# Patient Record
Sex: Female | Born: 1975 | Hispanic: No | Smoking: Never smoker
Health system: Southern US, Community
[De-identification: ages and names within clinical notes are randomized; demographics above are authoritative.]

## PROBLEM LIST (undated history)

## (undated) DIAGNOSIS — R51 Headache: Secondary | ICD-10-CM

## (undated) DIAGNOSIS — J309 Allergic rhinitis, unspecified: Secondary | ICD-10-CM

## (undated) DIAGNOSIS — M797 Fibromyalgia: Secondary | ICD-10-CM

## (undated) DIAGNOSIS — T7840XA Allergy, unspecified, initial encounter: Secondary | ICD-10-CM

## (undated) DIAGNOSIS — J45909 Unspecified asthma, uncomplicated: Secondary | ICD-10-CM

## (undated) DIAGNOSIS — F329 Major depressive disorder, single episode, unspecified: Secondary | ICD-10-CM

## (undated) DIAGNOSIS — E0500 Thyrotoxicosis with diffuse goiter without thyrotoxic crisis or storm: Secondary | ICD-10-CM

## (undated) DIAGNOSIS — F419 Anxiety disorder, unspecified: Secondary | ICD-10-CM

## (undated) DIAGNOSIS — F32A Depression, unspecified: Secondary | ICD-10-CM

## (undated) HISTORY — PX: HIP ARTHROSCOPY: SUR88

## (undated) HISTORY — DX: Headache: R51

## (undated) HISTORY — DX: Depression, unspecified: F32.A

## (undated) HISTORY — DX: Anxiety disorder, unspecified: F41.9

## (undated) HISTORY — DX: Allergic rhinitis, unspecified: J30.9

## (undated) HISTORY — DX: Major depressive disorder, single episode, unspecified: F32.9

## (undated) HISTORY — DX: Allergy, unspecified, initial encounter: T78.40XA

---

## 1998-02-06 ENCOUNTER — Emergency Department (HOSPITAL_COMMUNITY): Admission: EM | Admit: 1998-02-06 | Discharge: 1998-02-06 | Payer: Self-pay | Admitting: Emergency Medicine

## 1999-08-23 HISTORY — PX: APPENDECTOMY: SHX54

## 2000-10-26 ENCOUNTER — Emergency Department (HOSPITAL_COMMUNITY): Admission: EM | Admit: 2000-10-26 | Discharge: 2000-10-27 | Payer: Self-pay | Admitting: Emergency Medicine

## 2000-12-30 ENCOUNTER — Encounter: Payer: Self-pay | Admitting: Emergency Medicine

## 2000-12-30 ENCOUNTER — Emergency Department (HOSPITAL_COMMUNITY): Admission: EM | Admit: 2000-12-30 | Discharge: 2000-12-30 | Payer: Self-pay | Admitting: Emergency Medicine

## 2001-05-19 ENCOUNTER — Inpatient Hospital Stay (HOSPITAL_COMMUNITY): Admission: EM | Admit: 2001-05-19 | Discharge: 2001-05-20 | Payer: Self-pay | Admitting: Emergency Medicine

## 2002-03-07 ENCOUNTER — Emergency Department (HOSPITAL_COMMUNITY): Admission: EM | Admit: 2002-03-07 | Discharge: 2002-03-07 | Payer: Self-pay | Admitting: Emergency Medicine

## 2002-08-26 ENCOUNTER — Inpatient Hospital Stay (HOSPITAL_COMMUNITY): Admission: EM | Admit: 2002-08-26 | Discharge: 2002-08-30 | Payer: Self-pay | Admitting: Psychiatry

## 2002-09-04 ENCOUNTER — Other Ambulatory Visit (HOSPITAL_COMMUNITY): Admission: RE | Admit: 2002-09-04 | Discharge: 2002-09-19 | Payer: Self-pay | Admitting: *Deleted

## 2003-02-17 ENCOUNTER — Observation Stay (HOSPITAL_COMMUNITY): Admission: AD | Admit: 2003-02-17 | Discharge: 2003-02-17 | Payer: Self-pay | Admitting: *Deleted

## 2003-02-27 ENCOUNTER — Other Ambulatory Visit: Admission: RE | Admit: 2003-02-27 | Discharge: 2003-02-27 | Payer: Self-pay | Admitting: *Deleted

## 2003-05-08 ENCOUNTER — Inpatient Hospital Stay (HOSPITAL_COMMUNITY): Admission: AD | Admit: 2003-05-08 | Discharge: 2003-05-08 | Payer: Self-pay | Admitting: *Deleted

## 2003-07-15 ENCOUNTER — Inpatient Hospital Stay (HOSPITAL_COMMUNITY): Admission: AD | Admit: 2003-07-15 | Discharge: 2003-07-15 | Payer: Self-pay | Admitting: Obstetrics and Gynecology

## 2003-08-19 ENCOUNTER — Inpatient Hospital Stay (HOSPITAL_COMMUNITY): Admission: AD | Admit: 2003-08-19 | Discharge: 2003-08-21 | Payer: Self-pay | Admitting: *Deleted

## 2003-08-22 ENCOUNTER — Inpatient Hospital Stay (HOSPITAL_COMMUNITY): Admission: AD | Admit: 2003-08-22 | Discharge: 2003-08-22 | Payer: Self-pay | Admitting: Obstetrics and Gynecology

## 2003-09-01 ENCOUNTER — Inpatient Hospital Stay (HOSPITAL_COMMUNITY): Admission: AD | Admit: 2003-09-01 | Discharge: 2003-09-01 | Payer: Self-pay | Admitting: Obstetrics and Gynecology

## 2003-09-02 ENCOUNTER — Inpatient Hospital Stay (HOSPITAL_COMMUNITY): Admission: AD | Admit: 2003-09-02 | Discharge: 2003-09-02 | Payer: Self-pay | Admitting: Obstetrics and Gynecology

## 2003-09-15 ENCOUNTER — Inpatient Hospital Stay (HOSPITAL_COMMUNITY): Admission: AD | Admit: 2003-09-15 | Discharge: 2003-09-15 | Payer: Self-pay | Admitting: Obstetrics and Gynecology

## 2003-09-18 ENCOUNTER — Inpatient Hospital Stay (HOSPITAL_COMMUNITY): Admission: AD | Admit: 2003-09-18 | Discharge: 2003-09-19 | Payer: Self-pay | Admitting: Obstetrics and Gynecology

## 2003-09-20 ENCOUNTER — Inpatient Hospital Stay (HOSPITAL_COMMUNITY): Admission: AD | Admit: 2003-09-20 | Discharge: 2003-09-25 | Payer: Self-pay | Admitting: Obstetrics & Gynecology

## 2003-11-03 ENCOUNTER — Other Ambulatory Visit: Admission: RE | Admit: 2003-11-03 | Discharge: 2003-11-03 | Payer: Self-pay | Admitting: Obstetrics and Gynecology

## 2003-12-03 ENCOUNTER — Emergency Department (HOSPITAL_COMMUNITY): Admission: EM | Admit: 2003-12-03 | Discharge: 2003-12-03 | Payer: Self-pay | Admitting: Emergency Medicine

## 2004-06-14 ENCOUNTER — Emergency Department (HOSPITAL_COMMUNITY): Admission: EM | Admit: 2004-06-14 | Discharge: 2004-06-14 | Payer: Self-pay | Admitting: Emergency Medicine

## 2004-06-30 ENCOUNTER — Emergency Department (HOSPITAL_COMMUNITY): Admission: EM | Admit: 2004-06-30 | Discharge: 2004-06-30 | Payer: Self-pay | Admitting: Family Medicine

## 2004-07-07 ENCOUNTER — Observation Stay (HOSPITAL_COMMUNITY): Admission: RE | Admit: 2004-07-07 | Discharge: 2004-07-08 | Payer: Self-pay | Admitting: General Surgery

## 2004-08-22 HISTORY — PX: CHOLECYSTECTOMY: SHX55

## 2004-12-29 ENCOUNTER — Other Ambulatory Visit: Admission: RE | Admit: 2004-12-29 | Discharge: 2004-12-29 | Payer: Self-pay | Admitting: Obstetrics and Gynecology

## 2005-11-09 ENCOUNTER — Emergency Department (HOSPITAL_COMMUNITY): Admission: EM | Admit: 2005-11-09 | Discharge: 2005-11-09 | Payer: Self-pay | Admitting: Family Medicine

## 2006-02-07 ENCOUNTER — Emergency Department (HOSPITAL_COMMUNITY): Admission: EM | Admit: 2006-02-07 | Discharge: 2006-02-07 | Payer: Self-pay | Admitting: Family Medicine

## 2006-05-17 ENCOUNTER — Emergency Department (HOSPITAL_COMMUNITY): Admission: EM | Admit: 2006-05-17 | Discharge: 2006-05-17 | Payer: Self-pay | Admitting: Family Medicine

## 2006-06-10 ENCOUNTER — Emergency Department (HOSPITAL_COMMUNITY): Admission: EM | Admit: 2006-06-10 | Discharge: 2006-06-10 | Payer: Self-pay | Admitting: Emergency Medicine

## 2006-10-14 ENCOUNTER — Emergency Department (HOSPITAL_COMMUNITY): Admission: EM | Admit: 2006-10-14 | Discharge: 2006-10-14 | Payer: Self-pay | Admitting: Emergency Medicine

## 2011-02-04 ENCOUNTER — Inpatient Hospital Stay (HOSPITAL_COMMUNITY)
Admission: AD | Admit: 2011-02-04 | Discharge: 2011-02-04 | Disposition: A | Discharge status: Home / Self Care | Payer: Managed Care, Other (non HMO) | Source: Ambulatory Visit | Attending: Obstetrics and Gynecology | Admitting: Obstetrics and Gynecology

## 2011-02-04 DIAGNOSIS — O4700 False labor before 37 completed weeks of gestation, unspecified trimester: Secondary | ICD-10-CM | POA: Insufficient documentation

## 2011-02-04 LAB — URINALYSIS, ROUTINE W REFLEX MICROSCOPIC
Ketones, ur: NEGATIVE mg/dL
Leukocytes, UA: NEGATIVE
Nitrite: NEGATIVE
Protein, ur: NEGATIVE mg/dL
Specific Gravity, Urine: 1.025 (ref 1.005–1.030)
Urobilinogen, UA: 0.2 mg/dL (ref 0.0–1.0)
pH: 7 (ref 5.0–8.0)

## 2011-03-15 ENCOUNTER — Inpatient Hospital Stay (HOSPITAL_COMMUNITY)
Admission: AD | Admit: 2011-03-15 | Discharge: 2011-03-17 | DRG: 778 | Disposition: A | Discharge status: Home / Self Care | Payer: Managed Care, Other (non HMO) | Source: Ambulatory Visit | Attending: Obstetrics and Gynecology | Admitting: Obstetrics and Gynecology

## 2011-03-15 ENCOUNTER — Encounter (HOSPITAL_COMMUNITY): Payer: Self-pay | Admitting: *Deleted

## 2011-03-15 DIAGNOSIS — E079 Disorder of thyroid, unspecified: Secondary | ICD-10-CM | POA: Diagnosis present

## 2011-03-15 DIAGNOSIS — O99280 Endocrine, nutritional and metabolic diseases complicating pregnancy, unspecified trimester: Secondary | ICD-10-CM | POA: Diagnosis present

## 2011-03-15 DIAGNOSIS — E039 Hypothyroidism, unspecified: Secondary | ICD-10-CM | POA: Diagnosis present

## 2011-03-15 DIAGNOSIS — O09219 Supervision of pregnancy with history of pre-term labor, unspecified trimester: Secondary | ICD-10-CM

## 2011-03-15 DIAGNOSIS — O4700 False labor before 37 completed weeks of gestation, unspecified trimester: Principal | ICD-10-CM | POA: Diagnosis present

## 2011-03-15 HISTORY — DX: Thyrotoxicosis with diffuse goiter without thyrotoxic crisis or storm: E05.00

## 2011-03-15 LAB — URINALYSIS, ROUTINE W REFLEX MICROSCOPIC
Bilirubin Urine: NEGATIVE
Hgb urine dipstick: NEGATIVE
Ketones, ur: 15 mg/dL — AB
Leukocytes, UA: NEGATIVE
Nitrite: NEGATIVE
Protein, ur: NEGATIVE mg/dL
Specific Gravity, Urine: >1.03 — ABNORMAL HIGH (ref 1.005–1.030)
Urobilinogen, UA: 1 mg/dL (ref 0.0–1.0)
pH: 6 (ref 5.0–8.0)

## 2011-03-15 LAB — WET PREP, GENITAL
Clue Cells Wet Prep HPF POC: NONE SEEN
Trich, Wet Prep: NONE SEEN
WBC, Wet Prep HPF POC: NONE SEEN
Yeast Wet Prep HPF POC: NONE SEEN

## 2011-03-15 LAB — FETAL FIBRONECTIN: Fetal Fibronectin: POSITIVE — AB

## 2011-03-15 MED ORDER — ACETAMINOPHEN 325 MG PO TABS
650.0000 mg | ORAL_TABLET | ORAL | Status: DC | PRN
  Administered 2011-03-16 (×2): 650 mg via ORAL
  Filled 2011-03-15 (×2): qty 2

## 2011-03-15 MED ORDER — BETAMETHASONE SOD PHOS & ACET 6 (3-3) MG/ML IJ SUSP
12.0000 mg | INTRAMUSCULAR | Status: AC
  Administered 2011-03-15 – 2011-03-16 (×2): 12 mg via INTRAMUSCULAR
  Filled 2011-03-15 (×2): qty 2

## 2011-03-15 MED ORDER — LEVOTHYROXINE SODIUM 150 MCG PO TABS
150.0000 ug | ORAL_TABLET | Freq: Every day | ORAL | Status: DC
  Administered 2011-03-16 – 2011-03-17 (×2): 150 ug via ORAL
  Filled 2011-03-15 (×2): qty 1

## 2011-03-15 MED ORDER — ZOLPIDEM TARTRATE 10 MG PO TABS
10.0000 mg | ORAL_TABLET | Freq: Every evening | ORAL | Status: DC | PRN
  Administered 2011-03-16: 10 mg via ORAL
  Filled 2011-03-15: qty 1

## 2011-03-15 MED ORDER — LACTATED RINGERS IV SOLN
INTRAVENOUS | Status: DC
  Administered 2011-03-16 (×2): via INTRAVENOUS

## 2011-03-15 MED ORDER — TERBUTALINE SULFATE 1 MG/ML IJ SOLN
0.2500 mg | Freq: Once | INTRAMUSCULAR | Status: AC
  Administered 2011-03-15: 0.25 mg via SUBCUTANEOUS
  Filled 2011-03-15: qty 1

## 2011-03-15 MED ORDER — DOCUSATE SODIUM 100 MG PO CAPS
100.0000 mg | ORAL_CAPSULE | Freq: Every day | ORAL | Status: DC
  Administered 2011-03-16 – 2011-03-17 (×2): 100 mg via ORAL
  Filled 2011-03-15 (×2): qty 1

## 2011-03-15 MED ORDER — MAGNESIUM SULFATE 40 G IN LACTATED RINGERS - SIMPLE
2.0000 g/h | INTRAVENOUS | Status: DC
  Administered 2011-03-16: 4 g/h via INTRAVENOUS
  Filled 2011-03-15: qty 500

## 2011-03-15 MED ORDER — CALCIUM CARBONATE ANTACID 500 MG PO CHEW: 2.0000 | CHEWABLE_TABLET | ORAL | Status: DC | PRN

## 2011-03-15 MED ORDER — PRENATAL PLUS 27-1 MG PO TABS
1.0000 | ORAL_TABLET | Freq: Every day | ORAL | Status: DC
  Administered 2011-03-16 – 2011-03-17 (×2): 1 via ORAL
  Filled 2011-03-15 (×2): qty 1

## 2011-03-15 MED ORDER — SODIUM CHLORIDE 0.9 % IV SOLN
3.0000 g | Freq: Four times a day (QID) | INTRAVENOUS | Status: DC
  Administered 2011-03-16 (×2): 3 g via INTRAVENOUS
  Filled 2011-03-15 (×3): qty 3

## 2011-03-15 MED ORDER — MAGNESIUM SULFATE BOLUS VIA INFUSION
4.0000 g | Freq: Once | INTRAVENOUS | Status: AC
  Administered 2011-03-15: 4 g via INTRAVENOUS
  Filled 2011-03-15: qty 500

## 2011-03-15 MED ORDER — HYDROXYPROGESTERONE CAPROATE 250 MG/ML IM OIL
250.0000 mg | TOPICAL_OIL | INTRAMUSCULAR | Status: DC
  Administered 2011-03-16: 250 mg via INTRAMUSCULAR
  Filled 2011-03-15: qty 1

## 2011-03-15 NOTE — H&P (Signed)
SASHIA CAMPAS is a 35 y.o. female presenting for evaluation of PTL.  Pregnancy complcated by hx of PTD at 36 weeks has been on BR and weekly progesterone this pregnancy.  Have shortened cx by [place] 2 weeks ago. In triage, had ctxs q 3-5 minutes and cx 1/30/high which responded to terb but FFN returned positive. History OB History    Grav Para Term Preterm Abortions TAB SAB Ect Mult Living   2 1 0 1 0 0 0 0 0 1      Past Medical History  Diagnosis Date  . Grave's disease    Past Surgical History  Procedure Date  . Appendectomy 2001  . Cholecystectomy 2006   Family History: family history is not on file. Social History:  reports that she has never smoked. She does not have any smokeless tobacco history on file. She reports that she does not drink alcohol or use illicit drugs.  ROS  Dilation: 1 Effacement (%): 30 Station: -3 Exam by:: Ivonne Andrew, CNM  Blood pressure 119/63, pulse 95, temperature 98 F (36.7 C), temperature source Oral, resp. rate 20, height 6\' 3"  (1.905 m), weight 75.751 kg (167 lb), last menstrual period 06/22/2010. Exam Physical Exam  Prenatal labs: ABO, Rh:   Antibody:   Rubella:   RPR:    HBsAg:    HIV:    GBS:     Assessment/Plan: IUP at 33 weeks with PTL and positive FFN Admit for MgSo4 tocolysis and CP prophylaxis Check GBS Begin IV Unasyn until GBS returns BMZ DL   Enrika Aguado C 11/28/8117, 11:23 PM

## 2011-03-15 NOTE — ED Provider Notes (Signed)
fFN + UC's decreased after terb.  Dr. Rana Snare notified  Admit to Antepartum for Magnesium Sulfate, BMZ and ABX. Pt notified. Dr. Rana Snare to assume care of pt.

## 2011-03-15 NOTE — ED Provider Notes (Signed)
History   Chief Complaint:  Contractions   Lisa Day is  35 y.o. G2P0101. Patient's last menstrual period was 06/22/2010. She is [redacted]w[redacted]d weeks gestation. Since her last visit, the patient reports an increase in contractions and LBP. The patient denies bleeding or LOF and reports +FM. She is on 17-P for PTL and Hx 36.5 week delivery. On 02/03/11 her CL was 2.1 cm, closed, fFN neg. General ROS:  negative  Physical Exam   Blood pressure 118/79, pulse 85, temperature 98.2 F (36.8 C), resp. rate 20, height 6\' 3"  (1.905 m), weight 75.751 kg (167 lb), last menstrual period 06/22/2010.  Focused Gynecological Exam: normal external genitalia, vulva, vagina, uterus and adnexa VE: Dilation: 1 Effacement (%): 30 Cervical Position: Posterior Station: -3 Exam by:: Ivonne Andrew, CNM   EFM: 140's, Category I UC's: Q2-6, mild  Labs: Recent Results (from the past 24 hour(s))  URINALYSIS, ROUTINE W REFLEX MICROSCOPIC   Collection Time   03/15/11  7:55 PM      Component Value Range   Color, Urine YELLOW  YELLOW    Appearance CLEAR  CLEAR    Specific Gravity, Urine >1.030 (*) 1.005 - 1.030    pH 6.0  5.0 - 8.0    Glucose, UA NEGATIVE  NEGATIVE (mg/dL)   Hgb urine dipstick NEGATIVE  NEGATIVE    Bilirubin Urine NEGATIVE  NEGATIVE    Ketones, ur 15 (*) NEGATIVE (mg/dL)   Protein, ur NEGATIVE  NEGATIVE (mg/dL)   Urobilinogen, UA 1.0  0.0 - 1.0 (mg/dL)   Nitrite NEGATIVE  NEGATIVE    Leukocytes, UA NEGATIVE  NEGATIVE     Ultrasound Studies:   No results found.  Assessment: 1. [redacted]w[redacted]d weeks gestation 2. Preterm UC's w/ cervical change on 17-P 3. FHR category I 4. Dehydration, tol PO's   Plan: 1. Per consult w/ Dr. Rana Snare: fFN, Wet Prep. GBS, GC/CT 2. Terbutaline 0.25 mg SQ x 1 3. Push PO fluids 4. Will call MD w/ fFN results  [name] 03/15/2011, 9:17 PM

## 2011-03-15 NOTE — Progress Notes (Signed)
Pt in c/o pelvic pressure since 1730.  Is currently on progesterone shots weekly for preterm labor and shortened cervix.  Called the office and was told to come here.  Reports lower back pain, but denies any contractions.  Denies any bleeding or leaking of fluid.  + FM.

## 2011-03-16 ENCOUNTER — Inpatient Hospital Stay (HOSPITAL_COMMUNITY): Payer: Managed Care, Other (non HMO)

## 2011-03-16 MED ORDER — SODIUM CHLORIDE 0.9 % IJ SOLN
3.0000 mL | Freq: Two times a day (BID) | INTRAMUSCULAR | Status: DC
  Administered 2011-03-16 (×2): 3 mL via INTRAVENOUS

## 2011-03-16 MED ORDER — NIFEDIPINE 10 MG PO CAPS
10.0000 mg | ORAL_CAPSULE | Freq: Four times a day (QID) | ORAL | Status: DC
  Administered 2011-03-16 – 2011-03-17 (×5): 10 mg via ORAL
  Filled 2011-03-16 (×5): qty 1

## 2011-03-16 NOTE — Progress Notes (Signed)
UR chart review completed.  

## 2011-03-16 NOTE — Progress Notes (Signed)
  S:  No ctx's noted good fetal movement O:  Afebrile vss        Gravid uterus nontender cervix long and closed         fhr 140's reacitive no decels irritability noted A:  IUP at 33 3/7 with preterm ctx's      Hypothyroidism P: stop magnesium.  Begin procardia.  Check sono.  Check thyroid panel.  Finish bmz.  Stop unasyn.

## 2011-03-16 NOTE — Plan of Care (Signed)
Problem: Consults Goal: Birthing Suites Patient Information Press F2 to bring up selections list  Outcome: Progressing  Pt < [redacted] weeks EGA     

## 2011-03-17 DIAGNOSIS — O09219 Supervision of pregnancy with history of pre-term labor, unspecified trimester: Secondary | ICD-10-CM

## 2011-03-17 LAB — GC/CHLAMYDIA PROBE AMP, GENITAL: GC Probe Amp, Genital: NEGATIVE

## 2011-03-17 MED ORDER — NIFEDIPINE 10 MG PO CAPS: 10.0000 mg | ORAL_CAPSULE | Freq: Four times a day (QID) | ORAL | Status: DC

## 2011-03-17 NOTE — Discharge Summary (Signed)
Admission Diagnosis: PTL +FFN  Discharge Diagnosis: Same  HPI: 35 year old female at 49 1/2 weeks admitted with PTL and +FFN She was given tocolytics and steroids on admission. Patient 's contractions were easily treated. She was discharged home today with po Procardia 10 mg every 6 hours prn contractions She will followup in the office next week. PTL precautions discussed /reviewed with the patient

## 2011-03-17 NOTE — Progress Notes (Signed)
  S: Patient feels much better Denies UCs. Denies ROM. Good FM.  O:  AF VSS Abd soft nt Uterus is nt FHR is reassurring  TOCO no ucs  A: IUP at 35 + PTL +FFN P: Discharge home Po procardia 10 mg every 6 hours Discharge instructions followup next wed - already scheduled

## 2011-03-18 LAB — CULTURE, BETA STREP (GROUP B ONLY)

## 2011-03-20 NOTE — ED Provider Notes (Signed)
Positive Chlamydia.  Will fax report to provider's office for them to call patient and initiate treatment

## 2011-04-14 ENCOUNTER — Encounter (HOSPITAL_COMMUNITY): Payer: Self-pay | Admitting: *Deleted

## 2011-04-14 ENCOUNTER — Inpatient Hospital Stay (HOSPITAL_COMMUNITY)
Admission: AD | Admit: 2011-04-14 | Discharge: 2011-04-14 | Disposition: A | Discharge status: Home / Self Care | Payer: Managed Care, Other (non HMO) | Source: Ambulatory Visit | Attending: Obstetrics and Gynecology | Admitting: Obstetrics and Gynecology

## 2011-04-14 DIAGNOSIS — O479 False labor, unspecified: Secondary | ICD-10-CM | POA: Insufficient documentation

## 2011-04-14 LAB — STREP B DNA PROBE: GBS: POSITIVE

## 2011-04-14 NOTE — Progress Notes (Signed)
Pt in for labor eval, reports ucs q5 minutes.  Reports small amount of vaginal bleeding.  Denies any leaking of fluid.  + FM.

## 2011-04-14 NOTE — Progress Notes (Signed)
Pt states she is having contractions every 5 minutes with bloody show. Was in the office this am and was 3 cm. No leaking and reports good fetal movement.

## 2011-04-20 ENCOUNTER — Encounter (HOSPITAL_COMMUNITY): Payer: Self-pay | Admitting: *Deleted

## 2011-04-20 ENCOUNTER — Encounter (HOSPITAL_COMMUNITY): Payer: Self-pay | Admitting: Anesthesiology

## 2011-04-20 ENCOUNTER — Other Ambulatory Visit: Payer: Self-pay | Admitting: Obstetrics and Gynecology

## 2011-04-20 ENCOUNTER — Inpatient Hospital Stay (HOSPITAL_COMMUNITY)
Admission: AD | Admit: 2011-04-20 | Discharge: 2011-04-23 | DRG: 766 | Disposition: A | Discharge status: Home / Self Care | Payer: Managed Care, Other (non HMO) | Source: Ambulatory Visit | Attending: Obstetrics and Gynecology | Admitting: Obstetrics and Gynecology

## 2011-04-20 ENCOUNTER — Inpatient Hospital Stay (HOSPITAL_COMMUNITY): Payer: Managed Care, Other (non HMO) | Admitting: Anesthesiology

## 2011-04-20 ENCOUNTER — Encounter (HOSPITAL_COMMUNITY)
Admission: AD | Disposition: A | Discharge status: Home / Self Care | Payer: Self-pay | Source: Ambulatory Visit | Attending: Obstetrics and Gynecology

## 2011-04-20 DIAGNOSIS — O99892 Other specified diseases and conditions complicating childbirth: Secondary | ICD-10-CM | POA: Diagnosis present

## 2011-04-20 DIAGNOSIS — Z22330 Carrier of Group B streptococcus: Secondary | ICD-10-CM

## 2011-04-20 DIAGNOSIS — Z98891 History of uterine scar from previous surgery: Secondary | ICD-10-CM

## 2011-04-20 DIAGNOSIS — D649 Anemia, unspecified: Secondary | ICD-10-CM

## 2011-04-20 LAB — CBC
HCT: 29.8 % — ABNORMAL LOW (ref 36.0–46.0)
MCH: 27.5 pg (ref 26.0–34.0)
MCHC: 34.6 g/dL (ref 30.0–36.0)
MCV: 79.7 fL (ref 78.0–100.0)
RDW: 14.5 % (ref 11.5–15.5)

## 2011-04-20 SURGERY — Surgical Case
Anesthesia: Spinal | Site: Abdomen | Wound class: Clean Contaminated

## 2011-04-20 MED ORDER — MEPERIDINE HCL 25 MG/ML IJ SOLN: 6.2500 mg | INTRAMUSCULAR | Status: DC | PRN

## 2011-04-20 MED ORDER — NALBUPHINE HCL 10 MG/ML IJ SOLN
5.0000 mg | INTRAMUSCULAR | Status: DC | PRN
  Filled 2011-04-20: qty 1

## 2011-04-20 MED ORDER — SCOPOLAMINE 1 MG/3DAYS TD PT72
MEDICATED_PATCH | TRANSDERMAL | Status: AC
  Administered 2011-04-20: 1.5 mg via TRANSDERMAL
  Filled 2011-04-20: qty 1

## 2011-04-20 MED ORDER — KETOROLAC TROMETHAMINE 60 MG/2ML IM SOLN
INTRAMUSCULAR | Status: AC
  Administered 2011-04-20: 60 mg via INTRAMUSCULAR
  Filled 2011-04-20: qty 2

## 2011-04-20 MED ORDER — DIPHENHYDRAMINE HCL 25 MG PO CAPS: 25.0000 mg | ORAL_CAPSULE | Freq: Four times a day (QID) | ORAL | Status: DC | PRN

## 2011-04-20 MED ORDER — DIPHENHYDRAMINE HCL 50 MG/ML IJ SOLN
12.5000 mg | INTRAMUSCULAR | Status: DC | PRN
  Administered 2011-04-20: 12.5 mg via INTRAVENOUS
  Filled 2011-04-20: qty 1

## 2011-04-20 MED ORDER — FAMOTIDINE IN NACL 20-0.9 MG/50ML-% IV SOLN
20.0000 mg | Freq: Once | INTRAVENOUS | Status: AC
  Administered 2011-04-20: 20 mg via INTRAVENOUS

## 2011-04-20 MED ORDER — HYDROMORPHONE HCL 1 MG/ML IJ SOLN
INTRAMUSCULAR | Status: AC
Start: 2011-04-20 — End: 2011-04-20
  Filled 2011-04-20: qty 1

## 2011-04-20 MED ORDER — DIBUCAINE 1 % RE OINT: 1.0000 "application " | TOPICAL_OINTMENT | RECTAL | Status: DC | PRN

## 2011-04-20 MED ORDER — MENTHOL 3 MG MT LOZG: 1.0000 | LOZENGE | OROMUCOSAL | Status: DC | PRN

## 2011-04-20 MED ORDER — DIPHENHYDRAMINE HCL 50 MG/ML IJ SOLN: 25.0000 mg | INTRAMUSCULAR | Status: DC | PRN

## 2011-04-20 MED ORDER — ONDANSETRON HCL 4 MG/2ML IJ SOLN
INTRAMUSCULAR | Status: DC | PRN
  Administered 2011-04-20: 4 mg via INTRAVENOUS

## 2011-04-20 MED ORDER — IBUPROFEN 600 MG PO TABS: 600.0000 mg | ORAL_TABLET | Freq: Four times a day (QID) | ORAL | Status: DC | PRN

## 2011-04-20 MED ORDER — OXYTOCIN 20 UNITS IN LACTATED RINGERS INFUSION - SIMPLE
125.0000 mL/h | INTRAVENOUS | Status: AC
  Administered 2011-04-20: 125 mL/h via INTRAVENOUS

## 2011-04-20 MED ORDER — ONDANSETRON HCL 4 MG/2ML IJ SOLN: 4.0000 mg | INTRAMUSCULAR | Status: DC | PRN

## 2011-04-20 MED ORDER — PRENATAL PLUS 27-1 MG PO TABS
1.0000 | ORAL_TABLET | Freq: Every day | ORAL | Status: DC
  Administered 2011-04-21 – 2011-04-23 (×3): 1 via ORAL
  Filled 2011-04-20 (×3): qty 1

## 2011-04-20 MED ORDER — MEDROXYPROGESTERONE ACETATE 150 MG/ML IM SUSP: 150.0000 mg | INTRAMUSCULAR | Status: DC | PRN

## 2011-04-20 MED ORDER — LACTATED RINGERS IV SOLN
INTRAVENOUS | Status: DC | PRN
  Administered 2011-04-20 (×2): via INTRAVENOUS

## 2011-04-20 MED ORDER — FLEET ENEMA 7-19 GM/118ML RE ENEM: 1.0000 | ENEMA | RECTAL | Status: DC | PRN

## 2011-04-20 MED ORDER — DEXTROSE 5 % IV SOLN
1.0000 g | INTRAVENOUS | Status: AC
  Administered 2011-04-20: 1 g via INTRAVENOUS
  Filled 2011-04-20: qty 1

## 2011-04-20 MED ORDER — SCOPOLAMINE 1 MG/3DAYS TD PT72
1.0000 | MEDICATED_PATCH | Freq: Once | TRANSDERMAL | Status: AC
  Administered 2011-04-20: 1.5 mg via TRANSDERMAL

## 2011-04-20 MED ORDER — KETOROLAC TROMETHAMINE 60 MG/2ML IM SOLN
60.0000 mg | Freq: Once | INTRAMUSCULAR | Status: AC | PRN
  Administered 2011-04-20: 60 mg via INTRAMUSCULAR

## 2011-04-20 MED ORDER — PHENYLEPHRINE 40 MCG/ML (10ML) SYRINGE FOR IV PUSH (FOR BLOOD PRESSURE SUPPORT)
PREFILLED_SYRINGE | INTRAVENOUS | Status: AC
  Filled 2011-04-20: qty 5

## 2011-04-20 MED ORDER — LANOLIN HYDROUS EX OINT: 1.0000 "application " | TOPICAL_OINTMENT | CUTANEOUS | Status: DC | PRN

## 2011-04-20 MED ORDER — MORPHINE SULFATE (PF) 0.5 MG/ML IJ SOLN
INTRAMUSCULAR | Status: DC | PRN
  Administered 2011-04-20: .1 mg via INTRATHECAL

## 2011-04-20 MED ORDER — BISACODYL 10 MG RE SUPP: 10.0000 mg | Freq: Every day | RECTAL | Status: DC | PRN

## 2011-04-20 MED ORDER — OXYTOCIN 10 UNIT/ML IJ SOLN
INTRAMUSCULAR | Status: AC
  Filled 2011-04-20: qty 2

## 2011-04-20 MED ORDER — KETOROLAC TROMETHAMINE 30 MG/ML IJ SOLN: 30.0000 mg | Freq: Four times a day (QID) | INTRAMUSCULAR | Status: AC | PRN

## 2011-04-20 MED ORDER — LACTATED RINGERS IV SOLN
INTRAVENOUS | Status: DC
  Administered 2011-04-20: 16:00:00 via INTRAVENOUS

## 2011-04-20 MED ORDER — IBUPROFEN 600 MG PO TABS
600.0000 mg | ORAL_TABLET | Freq: Four times a day (QID) | ORAL | Status: DC
  Administered 2011-04-20 – 2011-04-23 (×12): 600 mg via ORAL
  Filled 2011-04-20 (×11): qty 1

## 2011-04-20 MED ORDER — BUPIVACAINE HCL (PF) 0.25 % IJ SOLN
INTRAMUSCULAR | Status: DC | PRN
  Administered 2011-04-20: 10 mL

## 2011-04-20 MED ORDER — LIDOCAINE IN DEXTROSE 5-7.5 % IV SOLN
INTRAVENOUS | Status: DC | PRN
  Administered 2011-04-20: 70 mg via INTRATHECAL

## 2011-04-20 MED ORDER — MEASLES, MUMPS & RUBELLA VAC ~~LOC~~ INJ: 0.5000 mL | INJECTION | Freq: Once | SUBCUTANEOUS | Status: DC

## 2011-04-20 MED ORDER — HYDROMORPHONE HCL 1 MG/ML IJ SOLN
0.2500 mg | INTRAMUSCULAR | Status: DC | PRN
  Administered 2011-04-20 (×3): 0.5 mg via INTRAVENOUS

## 2011-04-20 MED ORDER — FENTANYL CITRATE 0.05 MG/ML IJ SOLN
INTRAMUSCULAR | Status: DC | PRN
  Administered 2011-04-20: 15 ug via INTRATHECAL

## 2011-04-20 MED ORDER — WITCH HAZEL-GLYCERIN EX PADS: 1.0000 "application " | MEDICATED_PAD | CUTANEOUS | Status: DC | PRN

## 2011-04-20 MED ORDER — MORPHINE SULFATE 0.5 MG/ML IJ SOLN
INTRAMUSCULAR | Status: AC
  Filled 2011-04-20: qty 10

## 2011-04-20 MED ORDER — ONDANSETRON HCL 4 MG PO TABS: 4.0000 mg | ORAL_TABLET | ORAL | Status: DC | PRN

## 2011-04-20 MED ORDER — PHENYLEPHRINE HCL 10 MG/ML IJ SOLN
INTRAMUSCULAR | Status: DC | PRN
  Administered 2011-04-20 (×6): 40 ug via INTRAVENOUS

## 2011-04-20 MED ORDER — ONDANSETRON HCL 4 MG/2ML IJ SOLN: 4.0000 mg | Freq: Three times a day (TID) | INTRAMUSCULAR | Status: DC | PRN

## 2011-04-20 MED ORDER — SIMETHICONE 80 MG PO CHEW: 80.0000 mg | CHEWABLE_TABLET | ORAL | Status: DC | PRN

## 2011-04-20 MED ORDER — KETOROLAC TROMETHAMINE 30 MG/ML IJ SOLN
30.0000 mg | Freq: Four times a day (QID) | INTRAMUSCULAR | Status: AC | PRN
  Administered 2011-04-20: 30 mg via INTRAVENOUS
  Filled 2011-04-20: qty 1

## 2011-04-20 MED ORDER — NALOXONE HCL 0.4 MG/ML IJ SOLN
1.0000 ug/kg/h | INTRAMUSCULAR | Status: DC | PRN
  Filled 2011-04-20: qty 2.5

## 2011-04-20 MED ORDER — SODIUM CHLORIDE 0.9 % IJ SOLN: 3.0000 mL | INTRAMUSCULAR | Status: DC | PRN

## 2011-04-20 MED ORDER — ONDANSETRON HCL 4 MG/2ML IJ SOLN
INTRAMUSCULAR | Status: AC
  Filled 2011-04-20: qty 2

## 2011-04-20 MED ORDER — EPHEDRINE SULFATE 50 MG/ML IJ SOLN
INTRAMUSCULAR | Status: DC | PRN
  Administered 2011-04-20 (×4): 10 mg via INTRAVENOUS

## 2011-04-20 MED ORDER — SIMETHICONE 80 MG PO CHEW
80.0000 mg | CHEWABLE_TABLET | Freq: Three times a day (TID) | ORAL | Status: DC
  Administered 2011-04-20 – 2011-04-23 (×12): 80 mg via ORAL

## 2011-04-20 MED ORDER — OXYCODONE-ACETAMINOPHEN 5-325 MG PO TABS
1.0000 | ORAL_TABLET | ORAL | Status: DC | PRN
  Administered 2011-04-20 – 2011-04-22 (×7): 1 via ORAL
  Administered 2011-04-22: 2 via ORAL
  Administered 2011-04-22 – 2011-04-23 (×2): 1 via ORAL
  Filled 2011-04-20 (×3): qty 2
  Filled 2011-04-20: qty 1
  Filled 2011-04-20 (×3): qty 2
  Filled 2011-04-20: qty 1
  Filled 2011-04-20 (×5): qty 2
  Filled 2011-04-20: qty 1
  Filled 2011-04-20 (×2): qty 2
  Filled 2011-04-20: qty 1
  Filled 2011-04-20: qty 2
  Filled 2011-04-20: qty 1
  Filled 2011-04-20 (×3): qty 2

## 2011-04-20 MED ORDER — FENTANYL CITRATE 0.05 MG/ML IJ SOLN
INTRAMUSCULAR | Status: AC
  Filled 2011-04-20: qty 2

## 2011-04-20 MED ORDER — ZOLPIDEM TARTRATE 5 MG PO TABS: 5.0000 mg | ORAL_TABLET | Freq: Every evening | ORAL | Status: DC | PRN

## 2011-04-20 MED ORDER — TETANUS-DIPHTH-ACELL PERTUSSIS 5-2.5-18.5 LF-MCG/0.5 IM SUSP: 0.5000 mL | Freq: Once | INTRAMUSCULAR | Status: DC

## 2011-04-20 MED ORDER — OXYTOCIN 20 UNITS IN LACTATED RINGERS INFUSION - SIMPLE
INTRAVENOUS | Status: AC
  Filled 2011-04-20: qty 1000

## 2011-04-20 MED ORDER — METOCLOPRAMIDE HCL 5 MG/ML IJ SOLN: 10.0000 mg | Freq: Three times a day (TID) | INTRAMUSCULAR | Status: DC | PRN

## 2011-04-20 MED ORDER — HYDROMORPHONE HCL 1 MG/ML IJ SOLN
INTRAMUSCULAR | Status: AC
  Administered 2011-04-20: 0.5 mg via INTRAVENOUS
  Filled 2011-04-20: qty 1

## 2011-04-20 MED ORDER — NALOXONE HCL 0.4 MG/ML IJ SOLN: 0.4000 mg | INTRAMUSCULAR | Status: DC | PRN

## 2011-04-20 MED ORDER — CITRIC ACID-SODIUM CITRATE 334-500 MG/5ML PO SOLN
30.0000 mL | Freq: Once | ORAL | Status: AC
  Administered 2011-04-20: 30 mL via ORAL

## 2011-04-20 MED ORDER — EPHEDRINE 5 MG/ML INJ
INTRAVENOUS | Status: AC
  Filled 2011-04-20: qty 10

## 2011-04-20 MED ORDER — DIPHENHYDRAMINE HCL 25 MG PO CAPS: 25.0000 mg | ORAL_CAPSULE | ORAL | Status: DC | PRN

## 2011-04-20 MED ORDER — SENNOSIDES-DOCUSATE SODIUM 8.6-50 MG PO TABS
2.0000 | ORAL_TABLET | Freq: Every day | ORAL | Status: DC
  Administered 2011-04-20 – 2011-04-22 (×3): 2 via ORAL

## 2011-04-20 MED ORDER — LEVOTHYROXINE SODIUM 150 MCG PO TABS
150.0000 ug | ORAL_TABLET | Freq: Every day | ORAL | Status: DC
  Administered 2011-04-20 – 2011-04-23 (×4): 150 ug via ORAL
  Filled 2011-04-20 (×4): qty 1

## 2011-04-20 MED ORDER — OXYTOCIN 20 UNITS IN LACTATED RINGERS INFUSION - SIMPLE
INTRAVENOUS | Status: DC | PRN
  Administered 2011-04-20: 20 [IU] via INTRAVENOUS

## 2011-04-20 SURGICAL SUPPLY — 39 items
BARRIER ADHS 3X4 INTERCEED (GAUZE/BANDAGES/DRESSINGS) IMPLANT
BRR ADH 4X3 ABS CNTRL BYND (GAUZE/BANDAGES/DRESSINGS)
BRR ADH 6X5 SEPRAFILM 1 SHT (MISCELLANEOUS)
CHLORAPREP W/TINT 26ML (MISCELLANEOUS) ×2 IMPLANT
CLOTH BEACON ORANGE TIMEOUT ST (SAFETY) ×2 IMPLANT
CONTAINER PREFILL 10% NBF 15ML (MISCELLANEOUS) IMPLANT
DRSG COVADERM 4X10 (GAUZE/BANDAGES/DRESSINGS) ×1 IMPLANT
ELECT REM PT RETURN 9FT ADLT (ELECTROSURGICAL) ×2
ELECTRODE REM PT RTRN 9FT ADLT (ELECTROSURGICAL) ×1 IMPLANT
EXTRACTOR VACUUM M CUP 4 TUBE (SUCTIONS) ×1 IMPLANT
GLOVE BIO SURGEON STRL SZ 6.5 (GLOVE) ×2 IMPLANT
GLOVE BIOGEL PI IND STRL 6.5 (GLOVE) IMPLANT
GLOVE BIOGEL PI IND STRL 7.5 (GLOVE) IMPLANT
GLOVE BIOGEL PI IND STRL 8 (GLOVE) IMPLANT
GLOVE BIOGEL PI INDICATOR 6.5 (GLOVE) ×2
GLOVE BIOGEL PI INDICATOR 7.5 (GLOVE) ×1
GLOVE BIOGEL PI INDICATOR 8 (GLOVE) ×1
GOWN PREVENTION PLUS LG XLONG (DISPOSABLE) ×6 IMPLANT
KIT ABG SYR 3ML LUER SLIP (SYRINGE) ×1 IMPLANT
NDL HYPO 25X5/8 SAFETYGLIDE (NEEDLE) ×1 IMPLANT
NEEDLE HYPO 22GX1.5 SAFETY (NEEDLE) ×2 IMPLANT
NEEDLE HYPO 25X5/8 SAFETYGLIDE (NEEDLE) ×2 IMPLANT
NS IRRIG 1000ML POUR BTL (IV SOLUTION) ×3 IMPLANT
PACK C SECTION WH (CUSTOM PROCEDURE TRAY) ×2 IMPLANT
SEPRAFILM MEMBRANE 5X6 (MISCELLANEOUS) IMPLANT
SLEEVE SCD COMPRESS KNEE MED (MISCELLANEOUS) ×1 IMPLANT
SPONGE LAP 18X18 X RAY DECT (DISPOSABLE) ×1 IMPLANT
STAPLER VISISTAT 35W (STAPLE) ×1 IMPLANT
SUT CHROMIC 0 CTX 36 (SUTURE) ×4 IMPLANT
SUT PLAIN 0 NONE (SUTURE) IMPLANT
SUT PLAIN 2 0 XLH (SUTURE) IMPLANT
SUT VIC AB 0 CT1 27 (SUTURE) ×6
SUT VIC AB 0 CT1 27XBRD ANBCTR (SUTURE) ×3 IMPLANT
SUT VIC AB 4-0 KS 27 (SUTURE) IMPLANT
SYR CONTROL 10ML LL (SYRINGE) ×2 IMPLANT
TOWEL OR 17X24 6PK STRL BLUE (TOWEL DISPOSABLE) ×4 IMPLANT
TRAY FOLEY BAG SILVER LF 14FR (CATHETERS) ×1 IMPLANT
TRAY FOLEY CATH 14FR (SET/KITS/TRAYS/PACK) ×1 IMPLANT
WATER STERILE IRR 1000ML POUR (IV SOLUTION) ×1 IMPLANT

## 2011-04-20 NOTE — Progress Notes (Signed)
Pt reports contractions and ROM at 0200, clear fluid

## 2011-04-20 NOTE — Progress Notes (Signed)
AT 0357- TO OR  VIA STRETCHER- TRENDELENBURG.     0400- IN OR- FHR- 137

## 2011-04-20 NOTE — Progress Notes (Deleted)
0400-to or- FHR- 137

## 2011-04-20 NOTE — Transfer of Care (Signed)
  Anesthesia Post-op Note  Patient: Lisa Day  Procedure(s) Performed:  CESAREAN SECTION - Primary Cesarean Section with Delivery Baby Boy @ [phone]  Patient Location: PACU  Anesthesia Type: Spinal  Level of Consciousness: awake, alert  and oriented  Airway and Oxygen Therapy: Patient Spontanous Breathing  Post-op Pain: none  Post-op Assessment: Post-op Vital signs reviewed and Patient's Cardiovascular Status Stable  Post-op Vital Signs: Reviewed and stable  Complications: No apparent anesthesia complications

## 2011-04-20 NOTE — Progress Notes (Signed)
TRENDELENBURG POSITION

## 2011-04-20 NOTE — Consult Note (Signed)
Neonatology Note:   Attendance at C-section:    I was asked to attend this primary C/S at term. The mother is a G2P1 O pos, GBS pos with Grave's Disease, on Synthroid. She had onset of labor and had deep variable FHR decels with contractions. Due to this finding, a Stat c/s was done. ROM at delivery, fluid clear. Infant vigorous with good spontaneous cry and tone. Needed only minimal bulb suctioning. Ap 9/9. I examined this infant in CN a few minutes after delivery; lungs clear to ausc in DR. Transferred to care of Pediatrician.   Deatra James, MD

## 2011-04-20 NOTE — Anesthesia Preprocedure Evaluation (Signed)
Anesthesia Evaluation  Name, MR# and DOB Patient awake  General Assessment Comment  Reviewed: Allergy & Precautions, H&P , NPO status , Patient's Chart, lab work & pertinent test results, reviewed documented beta blocker date and time   History of Anesthesia Complications Negative for: history of anesthetic complications  Airway Mallampati: III TM Distance: >3 FB Neck ROM: full    Dental  (+) Teeth Intact   Pulmonary  clear to auscultation  breath sounds clear to auscultation none    Cardiovascular regular Normal    Neuro/Psych Negative Neurological ROS  Negative Psych ROS  GI/Hepatic/Renal negative GI ROS  negative Liver ROS  negative Renal ROS        Endo/Other  (+) Hyperthyroidism (s/p radioactive iodine - now on synthroid),     Abdominal   Musculoskeletal   Hematology negative hematology ROS (+)   Peds  Reproductive/Obstetrics (+) Pregnancy    Anesthesia Other Findings             Anesthesia Physical Anesthesia Plan  ASA: II  Anesthesia Plan: Spinal   Post-op Pain Management:    Induction:   Airway Management Planned:   Additional Equipment:   Intra-op Plan:   Post-operative Plan:   Informed Consent: I have reviewed the patients History and Physical, chart, labs and discussed the procedure including the risks, benefits and alternatives for the proposed anesthesia with the patient or authorized representative who has indicated his/her understanding and acceptance.     Plan Discussed with: CRNA and Surgeon  Anesthesia Plan Comments:         Anesthesia Quick Evaluation

## 2011-04-20 NOTE — Brief Op Note (Signed)
04/20/2011  4:54 AM  PATIENT:  Lisa Day  35 y.o. female  PRE-OPERATIVE DIAGNOSIS: IUP at 25 3/7, Non Reassurring FHR  POST-OPERATIVE DIAGNOSIS:  Same, Tight Body Cord  PROCEDURE:  Procedure(s): Primary Low Transverse CESAREAN SECTION  SURGEON:  Surgeon(s): Jeani Hawking, MD  PHYSICIAN ASSISTANT:   ASSISTANTS:  ANESTHESIA:   spinal  ESTIMATED BLOOD LOSS: 500  BLOOD ADMINISTERED:none  DRAINS: Urinary Catheter (Foley)   LOCAL MEDICATIONS USED:  MARCAINE 10 CC  SPECIMEN:  No Specimen  DISPOSITION OF SPECIMEN:  N/A  COUNTS:  YES  TOURNIQUET:  * No tourniquets in log *  DICTATION #: [id number]  PLAN OF CARE: to pacu  PATIENT DISPOSITION:  PACU - hemodynamically stable.   Delay start of Pharmacological VTE agent (>24hrs) due to surgical blood loss or risk of bleeding:  not applicable

## 2011-04-20 NOTE — Progress Notes (Signed)
WITH EACH UC -  DECREASE FHR-  VARIABLES   DOWN TO 60-70-  TURNED SIDE TO SIDE -LITTLE RECOVERY- VE- NOT FEEL PROLAPSE CORD BUT  HELD HEAD OFF CERVIX-   WITH RECOVERY .

## 2011-04-20 NOTE — Op Note (Signed)
[name], [name]              ACCOUNT NO.:  [id number]  MEDICAL RECORD NO.:  [id number]  LOCATION:  [facility]                          FACILITY:  [facility]  PHYSICIAN:  Michelle L. Grewal, M.D.DATE OF BIRTH:  1976-01-30  DATE OF PROCEDURE:  04/20/2011 DATE OF DISCHARGE:                              OPERATIVE REPORT   PREOPERATIVE DIAGNOSES: 1. Intrauterine pregnancy at 38 and 3. 2. Nonreassuring fetal heart rate.  POSTOPERATIVE DIAGNOSES: 1. Intrauterine pregnancy at 38 and 3. 2. Nonreassuring fetal heart rate. 3. Tight cord wrapped around the feet.  PROCEDURE:  Primary low-transverse cesarean section.  SURGEON:  Michelle L. Grewal, MD  ANESTHESIA:  Spinal.  ESTIMATED BLOOD LOSS:  500 mL.  DRAINS:  Foley.  SPECIMENS:  None.  PROCEDURE:  The patient was urgently taken from triage to the operating room.  She was given a spinal.  Time-out was performed.  She was prepped and draped.  A Foley catheter was inserted.  A low transverse incision was made, carried down to the fascia.  Fascia scored to the midline, extended laterally.  Rectus muscles were separated in the midline. Peritoneum was entered bluntly.  The peritoneal incision was then stretched.  The bladder blade was inserted.  The lower uterine segment was identified and bladder flap was created sharply and then digitally. The low transverse incision was made in the uterus.  Uterus was entered using a hemostat.  The baby was in OP position and was delivered easily. When I noted at the time of delivery, there was a lot of cord tightly wrapped around, but I think this was the cause of the decelerations that were noted in triage.  The baby was delivered easily with a vacuum extractor with one pull and no pop-off.  The baby was a female infant. The cord was clamped and cut and the baby was handed to the awaiting neonatal team and the baby was taken to the newborn nursery.  The uterus was exteriorized.  The placenta was delivered  spontaneously intact with a three-vessel cord.  Antibiotics and Pitocin were given.  The uterine incision was closed in one layer using 0-chromic in running locked stitch.  The uterus was returned to the abdomen.  Irrigation was performed.  The peritoneum was closed using 0 Vicryl, the fascia was closed using 0 Vicryl in running stitch.  After irrigation of subcutaneous layer, the skin was closed with staples.  Local was infiltrated.  All sponge, lap and instrument counts were correct x2. The patient went to recovery room in stable condition.     Michelle L. Vincente Poli, M.D.     Florestine Avers  D:  04/20/2011  T:  04/20/2011  Job:  [id number]

## 2011-04-20 NOTE — Anesthesia Procedure Notes (Addendum)
Spinal Block  Patient location during procedure: OR Start time: 04/20/2011 4:14 AM Staffing Performed by: anesthesiologist  Preanesthetic Checklist Completed: patient identified, site marked, surgical consent, pre-op evaluation, timeout performed, IV checked, risks and benefits discussed and monitors and equipment checked Spinal Block Patient position: right lateral decubitus Prep: site prepped and draped and DuraPrep Patient monitoring: heart rate, cardiac monitor, continuous pulse ox and blood pressure Approach: midline Location: L3-4 Injection technique: single-shot Needle Needle type: Sprotte  Needle gauge: 24 G Needle length: 5 cm Assessment Sensory level: T4

## 2011-04-20 NOTE — H&P (Signed)
This is a 35 year old G2P1 at 29 and 3/7 presented to triage with SROM and UCS at 0200. PNC is uncomplicated GBBS+. History of NSVD In triage, very deep variable decelerations noted, repetitive. They were relieved only with deep Trendelenberg and when the nurse elevated the fetal vertex the decels resolved.  When I arrived to triage, the patient was in T [place]. FHR was reassurring. UCs every 2 to 3  Cervix 4 cm dilated  No cord prolapse noted However, because of the deep variables I am concerned about possible short cord or tight nuchal cord and given remote from vaginal delivery I recommend Stat CS. Risks discussed with patient  Will proceed with delivery now Arrived to OR now Spinal on patient's side

## 2011-04-20 NOTE — Anesthesia Postprocedure Evaluation (Signed)
Anesthesia Post Note  Patient: Lisa Day  Procedure(s) Performed:  CESAREAN SECTION - Primary Cesarean Section with Delivery Baby Boy @ [phone]  Anesthesia type: Spinal  Patient location: PACU  Post pain: Pain level controlled  Post assessment: Post-op Vital signs reviewed  Last Vitals:  Filed Vitals:   04/20/11 0515  BP: 106/66  Pulse: 80  Temp:   Resp: 18    Post vital signs: Reviewed  Level of consciousness: awake  Complications: No apparent anesthesia complications

## 2011-04-21 LAB — CBC
Hemoglobin: 8.2 g/dL — ABNORMAL LOW (ref 12.0–15.0)
MCH: 27.2 pg (ref 26.0–34.0)
RBC: 3.02 MIL/uL — ABNORMAL LOW (ref 3.87–5.11)

## 2011-04-21 NOTE — Progress Notes (Signed)
Subjective: Postpartum Day 1: Cesarean Delivery Patient reports tolerating PO, + flatus and no problems voiding.    Objective: Vital signs in last 24 hours: Temp:  [97.6 F (36.4 C)-98.5 F (36.9 C)] 98.2 F (36.8 C) (08/30 0500) Pulse Rate:  [81-104] 87  (08/30 0500) Resp:  [16-20] 20  (08/30 0500) BP: (96-117)/(17-73) 96/63 mmHg (08/30 0500) SpO2:  [97 %-99 %] 97 % (08/30 0500)  Physical Exam:  General: alert and cooperative Lochia: appropriate Uterine Fundus: firm Incision: healing well DVT Evaluation: No evidence of DVT seen on physical exam.   Basename 04/21/11 0545 04/20/11 0325  HGB 8.2* 10.3*  HCT 24.6* 29.8*    Assessment/Plan: Status post Cesarean section. Doing well postoperatively.  Continue current care.  CURTIS,CAROL G 04/21/2011, 8:31 AM

## 2011-04-22 MED ORDER — FERROUS SULFATE 325 (65 FE) MG PO TABS
325.0000 mg | ORAL_TABLET | Freq: Three times a day (TID) | ORAL | Status: DC
  Administered 2011-04-22 – 2011-04-23 (×2): 325 mg via ORAL
  Filled 2011-04-22 (×2): qty 1

## 2011-04-22 NOTE — Progress Notes (Signed)
Subjective: Postpartum Day 2: Cesarean Delivery Patient reports tolerating PO, + flatus and no problems voiding.    Objective: Vital signs in last 24 hours: Temp:  [98.1 F (36.7 C)-98.5 F (36.9 C)] 98.5 F (36.9 C) (08/31 0642) Pulse Rate:  [81-87] 85  (08/31 0642) Resp:  [18-19] 18  (08/31 0642) BP: (105-110)/(65-69) 110/65 mmHg (08/31 7829)  Physical Exam:  General: alert and cooperative Lochia: appropriate Uterine Fundus: firm Incision: healing well DVT Evaluation: No evidence of DVT seen on physical exam.   Basename 04/21/11 0545 04/20/11 0325  HGB 8.2* 10.3*  HCT 24.6* 29.8*    Assessment/Plan: Status post Cesarean section. Doing well postoperatively.  Continue current care Anticipate dc in am.  CURTIS,CAROL G 04/22/2011, 8:20 AM

## 2011-04-23 MED ORDER — IBUPROFEN 600 MG PO TABS: 600.0000 mg | ORAL_TABLET | Freq: Four times a day (QID) | ORAL | Status: AC

## 2011-04-23 MED ORDER — OXYCODONE-ACETAMINOPHEN 5-325 MG PO TABS: 1.0000 | ORAL_TABLET | Freq: Four times a day (QID) | ORAL | Status: AC | PRN

## 2011-04-23 MED ORDER — FERROUS SULFATE 325 (65 FE) MG PO TABS: 325.0000 mg | ORAL_TABLET | Freq: Two times a day (BID) | ORAL | Status: DC

## 2011-04-23 NOTE — Progress Notes (Deleted)
Subjective: Postpartum Day 3: Cesarean Delivery Patient reports tolerating PO.    Objective: Vital signs in last 24 hours: Temp:  [97.8 F (36.6 C)-98.4 F (36.9 C)] 97.8 F (36.6 C) (09/01 0546) Pulse Rate:  [66-70] 67  (09/01 0546) Resp:  [18] 18  (09/01 0546) BP: (101-112)/(66-71) 112/67 mmHg (09/01 0546)  Physical Exam:  General: alert Lochia: appropriate Uterine Fundus: firm Incision: healing well DVT Evaluation: No evidence of DVT seen on physical exam.   Basename 04/21/11 0545  HGB 8.2*  HCT 24.6*    Assessment/Plan: Status post Cesarean section. Doing well postoperatively.  DC>>>office 1 week to check inc.  Meriel Pica 04/23/2011, 7:42 AM

## 2011-04-26 NOTE — Discharge Summary (Signed)
Obstetric Discharge Summary Reason for Admission: onset of labor and rupture of membranes Prenatal Procedures: none Intrapartum Procedures: cesarean: low cervical, transverse Postpartum Procedures: none Complications-Operative and Postpartum: none Hemoglobin  Date Value Range Status  04/21/2011 8.2* 12.0-15.0 (g/dL) Final     DELTA CHECK NOTED     REPEATED TO VERIFY     HCT  Date Value Range Status  04/21/2011 24.6* 36.0-46.0 (%) Final    Discharge Diagnoses: Term Pregnancy-delivered  Discharge Information: Date: 04/26/2011 Activity: pelvic rest Diet: routine Medications: PNV, Ibuprophen, Percocet and fe Condition: improved Instructions: refer to practice specific booklet Discharge to: home Follow-up Information    Follow up with [facility] M. Make an appointment in 1 week.   Contact information:   [address] [place] [address] [phone]          Newborn Data: Live born female  Birth Weight: 6 lb 3.5 oz (2820 g) APGAR: 9, 9  Home with mother.  Hala Narula G 04/26/2011, 8:46 AM

## 2011-04-28 ENCOUNTER — Inpatient Hospital Stay (HOSPITAL_COMMUNITY): Admit: 2011-04-28 | Payer: Managed Care, Other (non HMO)

## 2011-05-03 ENCOUNTER — Encounter (HOSPITAL_COMMUNITY): Payer: Self-pay | Admitting: Obstetrics and Gynecology

## 2011-06-07 ENCOUNTER — Encounter (HOSPITAL_COMMUNITY): Payer: Self-pay | Admitting: *Deleted

## 2011-09-21 ENCOUNTER — Other Ambulatory Visit: Payer: Self-pay | Admitting: Family Medicine

## 2011-09-21 ENCOUNTER — Ambulatory Visit
Admission: RE | Admit: 2011-09-21 | Discharge: 2011-09-21 | Disposition: A | Discharge status: Home / Self Care | Payer: Managed Care, Other (non HMO) | Source: Ambulatory Visit | Attending: Family Medicine | Admitting: Family Medicine

## 2011-09-21 DIAGNOSIS — M549 Dorsalgia, unspecified: Secondary | ICD-10-CM

## 2012-01-30 ENCOUNTER — Ambulatory Visit
Admission: RE | Admit: 2012-01-30 | Discharge: 2012-01-30 | Disposition: A | Discharge status: Home / Self Care | Payer: Managed Care, Other (non HMO) | Source: Ambulatory Visit | Attending: Family Medicine | Admitting: Family Medicine

## 2012-01-30 ENCOUNTER — Other Ambulatory Visit: Payer: Self-pay | Admitting: Family Medicine

## 2012-01-30 DIAGNOSIS — R05 Cough: Secondary | ICD-10-CM

## 2013-01-02 DIAGNOSIS — Z0000 Encounter for general adult medical examination without abnormal findings: Secondary | ICD-10-CM | POA: Insufficient documentation

## 2013-05-22 ENCOUNTER — Emergency Department (HOSPITAL_COMMUNITY)
Admission: EM | Admit: 2013-05-22 | Discharge: 2013-05-22 | Disposition: A | Discharge status: Home / Self Care | Payer: Managed Care, Other (non HMO) | Attending: Emergency Medicine | Admitting: Emergency Medicine

## 2013-05-22 ENCOUNTER — Encounter (HOSPITAL_COMMUNITY): Payer: Self-pay | Admitting: Emergency Medicine

## 2013-05-22 ENCOUNTER — Emergency Department (HOSPITAL_COMMUNITY): Payer: Managed Care, Other (non HMO)

## 2013-05-22 DIAGNOSIS — Z8639 Personal history of other endocrine, nutritional and metabolic disease: Secondary | ICD-10-CM | POA: Insufficient documentation

## 2013-05-22 DIAGNOSIS — R000 Tachycardia, unspecified: Secondary | ICD-10-CM | POA: Insufficient documentation

## 2013-05-22 DIAGNOSIS — Z862 Personal history of diseases of the blood and blood-forming organs and certain disorders involving the immune mechanism: Secondary | ICD-10-CM | POA: Insufficient documentation

## 2013-05-22 DIAGNOSIS — R002 Palpitations: Secondary | ICD-10-CM

## 2013-05-22 DIAGNOSIS — R05 Cough: Secondary | ICD-10-CM | POA: Insufficient documentation

## 2013-05-22 DIAGNOSIS — R059 Cough, unspecified: Secondary | ICD-10-CM | POA: Insufficient documentation

## 2013-05-22 DIAGNOSIS — R42 Dizziness and giddiness: Secondary | ICD-10-CM | POA: Insufficient documentation

## 2013-05-22 DIAGNOSIS — E876 Hypokalemia: Secondary | ICD-10-CM | POA: Insufficient documentation

## 2013-05-22 DIAGNOSIS — Z79899 Other long term (current) drug therapy: Secondary | ICD-10-CM | POA: Insufficient documentation

## 2013-05-22 LAB — BASIC METABOLIC PANEL
BUN: 12 mg/dL (ref 6–23)
Chloride: 103 mEq/L (ref 96–112)
GFR calc Af Amer: >90 mL/min (ref >90)
GFR calc non Af Amer: >90 mL/min (ref >90)
Potassium: 3.1 mEq/L — ABNORMAL LOW (ref 3.5–5.1)
Sodium: 135 mEq/L (ref 135–145)

## 2013-05-22 LAB — CBC WITH DIFFERENTIAL/PLATELET
Basophils Relative: 0 % (ref 0–1)
Hemoglobin: 11.6 g/dL — ABNORMAL LOW (ref 12.0–15.0)
Lymphs Abs: 3 10*3/uL (ref 0.7–4.0)
MCH: 28 pg (ref 26.0–34.0)
MCHC: 34 g/dL (ref 30.0–36.0)
MCV: 82.2 fL (ref 78.0–100.0)
Monocytes Relative: 7 % (ref 3–12)
Neutro Abs: 8.8 10*3/uL — ABNORMAL HIGH (ref 1.7–7.7)
Neutrophils Relative %: 69 % (ref 43–77)
Platelets: 289 10*3/uL (ref 150–400)
RBC: 4.15 MIL/uL (ref 3.87–5.11)
RDW: 13.1 % (ref 11.5–15.5)

## 2013-05-22 MED ORDER — POTASSIUM CHLORIDE CRYS ER 20 MEQ PO TBCR
40.0000 meq | EXTENDED_RELEASE_TABLET | Freq: Once | ORAL | Status: AC
  Administered 2013-05-22: 40 meq via ORAL
  Filled 2013-05-22: qty 2

## 2013-05-22 MED ORDER — POTASSIUM CHLORIDE CRYS ER 20 MEQ PO TBCR: 20.0000 meq | EXTENDED_RELEASE_TABLET | Freq: Every day | ORAL | Status: DC

## 2013-05-22 MED ORDER — SODIUM CHLORIDE 0.9 % IV SOLN
INTRAVENOUS | Status: DC
  Administered 2013-05-22 (×3): via INTRAVENOUS

## 2013-05-22 NOTE — ED Notes (Signed)
Pt here for c/o dizziness, sob and heart racing x1 day denies fainting

## 2013-05-22 NOTE — ED Provider Notes (Signed)
CSN: [id number]     Arrival date & time 05/22/13  1750 History   First MD Initiated Contact with Patient 05/22/13 1801     Chief Complaint  Patient presents with  . Shortness of Breath  . Dizziness  . Tachycardia    Patient is a 37 y.o. female presenting with shortness of breath. The history is provided by the patient.  Shortness of Breath Severity:  Moderate Onset quality:  Gradual Duration: today. 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  She had her thyroid levels drawn at her doctor's office in the last week but does not know the results.  No recent trips or travel.  No  Past Medical History  Diagnosis Date  . Grave's disease    Past Surgical History  Procedure Laterality Date  . Appendectomy  2001  . Cholecystectomy  2006  . Cesarean section  04/20/2011    Procedure: CESAREAN SECTION;  Surgeon: Jeani Hawking, MD;  Location: [facility];  Service: Gynecology;  Laterality: N/A;  Primary Cesarean Section with Delivery Baby Boy @ 0421   No family history on file. History  Substance Use Topics  . Smoking status: Never Smoker   . Smokeless tobacco: Not on file  . Alcohol Use: No   OB History   Grav Para Term Preterm Abortions TAB SAB Ect Mult Living   2 2 1 1  0 0 0 0 0 2     Review of Systems  Constitutional: Negative for fever and diaphoresis.  HENT: Negative for neck pain.   Respiratory: Positive for cough and shortness of breath. Negative for hemoptysis and wheezing.   Cardiovascular: Negative for chest pain and syncope.  Gastrointestinal: Negative for vomiting and abdominal pain.  Skin: Negative for rash.   All other systems reviewed and are negative.    Allergies  Shrimp; Watermelon concentrate; and Latex  Home Medications   Current Outpatient Rx  Name  Route  Sig  Dispense  Refill  . albuterol (PROVENTIL HFA;VENTOLIN HFA) 108 (90 BASE) MCG/ACT inhaler   Inhalation   Inhale 2 puffs into the lungs every 6 (six) hours as needed for wheezing.         Marland Kitchen EPINEPHrine (EPI-PEN) 0.3 mg/0.3 mL SOAJ injection   Intramuscular   Inject 0.3 mg into the muscle once.         . fluticasone (FLONASE) 50 MCG/ACT nasal spray   Nasal   Place 2 sprays into the nose daily.         Marland Kitchen levocetirizine (XYZAL) 5 MG tablet   Oral   Take 5 mg by mouth every evening.         Marland Kitchen levonorgestrel (MIRENA) 20 MCG/24HR IUD   Intrauterine   1 each by Intrauterine route once.         Marland Kitchen levothyroxine (SYNTHROID, LEVOTHROID) 150 MCG tablet   Oral   Take 150 mcg by mouth daily.          . montelukast (SINGULAIR) 10 MG tablet   Oral   Take 10 mg by mouth at bedtime.         . predniSONE (DELTASONE)  10 MG tablet   Oral   Take 10 mg by mouth 2 (two) times daily. For 5 days. On day 5 take just 1 tablet         . PRESCRIPTION MEDICATION   Intramuscular   Inject 1 Applicatorful into the muscle every other day. Monday, Wednesday, and Friday.  Allergy shot.         Marland Kitchen tiZANidine (ZANAFLEX) 4 MG tablet   Oral   Take 4 mg by mouth every 6 (six) hours as needed.         . potassium chloride SA (K-DUR,KLOR-CON) 20 MEQ tablet   Oral   Take 1 tablet (20 mEq total) by mouth daily.   5 tablet   0    There were no vitals taken for this visit. Physical Exam  Nursing note and vitals reviewed. Constitutional: She appears well-developed and well-nourished. No distress.  HENT:  Head: Normocephalic and atraumatic.  Right Ear: External ear normal.  Left Ear: External ear normal.  Eyes: Conjunctivae are normal. Right eye exhibits no discharge. Left eye exhibits no discharge. No scleral icterus.   Neck: Neck supple. No JVD present. No tracheal deviation present. No thyromegaly present.  Cardiovascular: Normal rate, regular rhythm and intact distal pulses.   Pulmonary/Chest: Effort normal and breath sounds normal. No stridor. No respiratory distress. She has no wheezes. She has no rales.  Abdominal: Soft. Bowel sounds are normal. She exhibits no distension. There is no tenderness. There is no rebound and no guarding.  Musculoskeletal: She exhibits no edema and no tenderness.  Neurological: She is alert. She has normal strength. No sensory deficit. Cranial nerve deficit:  no gross defecits noted. She exhibits normal muscle tone. She displays no seizure activity. Coordination normal.  Skin: Skin is warm and dry. No rash noted.  Psychiatric: She has a normal mood and affect.    ED Course  Procedures (including critical care time) EKG  Rate: 75  Rhythm: normal sinus rhythm  QRS Axis: normal  Intervals: normal  ST/T Wave abnormalities: normal  Conduction Disutrbances:none  Narrative Interpretation:   Old EKG Reviewed: none available   Labs Review Labs Reviewed  CBC WITH DIFFERENTIAL - Abnormal; Notable for the following:    WBC 12.8 (*)    Hemoglobin 11.6 (*)    HCT 34.1 (*)    Neutro Abs 8.8 (*)    All other components within normal limits  BASIC METABOLIC PANEL - Abnormal; Notable for the following:    Potassium 3.1 (*)    All other components within normal limits   Imaging Review Dg Chest 2 View  05/22/2013   CLINICAL DATA:  dizziness, tachycardia, and shortness of breath  EXAM: CHEST  2 VIEW  COMPARISON:  January 30, 2012  FINDINGS: The lungs are clear. Heart size and pulmonary vascularity are normal. No adenopathy. No bone lesions. .  IMPRESSION: No abnormality noted.   Electronically Signed   By: Bretta Bang   On: 05/22/2013 18:42    MDM   1. Hypokalemia   2. Palpitations    Have a low suspicion for pulmonary embolism. Patient is perc negative. I doubt acute  coronary syndrome. Patient has a normal rhythm here in the emergency department. She is not tachycardic. She's not having any dyspnea. Patient does have mild hypokalemia. I will give her potassium replacement and have her followup with her primary doctor regarding her hyperthyroidism    Celene Kras, MD 05/22/13 2014

## 2014-03-19 ENCOUNTER — Ambulatory Visit
Admission: RE | Admit: 2014-03-19 | Discharge: 2014-03-19 | Disposition: A | Discharge status: Home / Self Care | Payer: Managed Care, Other (non HMO) | Source: Ambulatory Visit | Attending: Family Medicine | Admitting: Family Medicine

## 2014-03-19 ENCOUNTER — Other Ambulatory Visit: Payer: Self-pay | Admitting: Family Medicine

## 2014-03-19 DIAGNOSIS — M25511 Pain in right shoulder: Secondary | ICD-10-CM

## 2014-04-24 ENCOUNTER — Encounter (HOSPITAL_COMMUNITY): Payer: Self-pay | Admitting: Emergency Medicine

## 2014-04-24 ENCOUNTER — Emergency Department (HOSPITAL_COMMUNITY)
Admission: EM | Admit: 2014-04-24 | Discharge: 2014-04-24 | Disposition: A | Discharge status: Home / Self Care | Payer: Managed Care, Other (non HMO) | Attending: Emergency Medicine | Admitting: Emergency Medicine

## 2014-04-24 DIAGNOSIS — Z862 Personal history of diseases of the blood and blood-forming organs and certain disorders involving the immune mechanism: Secondary | ICD-10-CM | POA: Insufficient documentation

## 2014-04-24 DIAGNOSIS — IMO0002 Reserved for concepts with insufficient information to code with codable children: Secondary | ICD-10-CM | POA: Insufficient documentation

## 2014-04-24 DIAGNOSIS — R51 Headache: Secondary | ICD-10-CM | POA: Diagnosis not present

## 2014-04-24 DIAGNOSIS — Z91040 Latex allergy status: Secondary | ICD-10-CM | POA: Insufficient documentation

## 2014-04-24 DIAGNOSIS — IMO0001 Reserved for inherently not codable concepts without codable children: Secondary | ICD-10-CM | POA: Diagnosis not present

## 2014-04-24 DIAGNOSIS — Z8639 Personal history of other endocrine, nutritional and metabolic disease: Secondary | ICD-10-CM | POA: Insufficient documentation

## 2014-04-24 DIAGNOSIS — J45909 Unspecified asthma, uncomplicated: Secondary | ICD-10-CM | POA: Insufficient documentation

## 2014-04-24 DIAGNOSIS — Z79899 Other long term (current) drug therapy: Secondary | ICD-10-CM | POA: Insufficient documentation

## 2014-04-24 DIAGNOSIS — R110 Nausea: Secondary | ICD-10-CM | POA: Insufficient documentation

## 2014-04-24 DIAGNOSIS — H539 Unspecified visual disturbance: Secondary | ICD-10-CM | POA: Diagnosis not present

## 2014-04-24 DIAGNOSIS — R519 Headache, unspecified: Secondary | ICD-10-CM

## 2014-04-24 HISTORY — DX: Fibromyalgia: M79.7

## 2014-04-24 HISTORY — DX: Unspecified asthma, uncomplicated: J45.909

## 2014-04-24 MED ORDER — ONDANSETRON 4 MG PO TBDP
8.0000 mg | ORAL_TABLET | Freq: Once | ORAL | Status: AC
  Administered 2014-04-24: 8 mg via ORAL
  Filled 2014-04-24: qty 2

## 2014-04-24 MED ORDER — KETOROLAC TROMETHAMINE 30 MG/ML IJ SOLN
30.0000 mg | Freq: Once | INTRAMUSCULAR | Status: AC
  Administered 2014-04-24: 30 mg via INTRAMUSCULAR
  Filled 2014-04-24: qty 1

## 2014-04-24 MED ORDER — METOCLOPRAMIDE HCL 5 MG/ML IJ SOLN
10.0000 mg | Freq: Once | INTRAMUSCULAR | Status: AC
  Administered 2014-04-24: 10 mg via INTRAMUSCULAR
  Filled 2014-04-24: qty 2

## 2014-04-24 MED ORDER — DIPHENHYDRAMINE HCL 50 MG/ML IJ SOLN
25.0000 mg | Freq: Once | INTRAMUSCULAR | Status: AC
  Administered 2014-04-24: 25 mg via INTRAMUSCULAR
  Filled 2014-04-24: qty 1

## 2014-04-24 NOTE — ED Provider Notes (Signed)
CSN: [id number]     Arrival date & time 04/24/14  1245 History  This chart was scribed for non-physician practitioner, Rhea Bleacher, PA-C working with Flint Melter, MD by Greggory Stallion, ED scribe. This patient was seen in room TR06C/TR06C and the patient's care was started at 2:12 PM.     Chief Complaint  Patient presents with  . Migraine    The patient says she has been diagnosed with migraines.  She says it started tuesday and nothing has worked.  She has been prescribed muscles relaxers for the migraines and she took one last night and it did not help.   The history is provided by the patient. No language interpreter was used.   HPI Comments: Lisa Day is a 38 y.o. female who presents to the Emergency Department complaining of gradual onset migraine that started 2 days ago around noon. Reports associated nausea, noise sensitivity, photophobia, blurred vision and weakness. Pt has history of migraines and has been given flexeril. She has taken that and also used tylenol with no relief. Patient states that pain is similar to migraines but not the exact same. Onset was gradual -- pain began very mild and gradually worsened 2 days ago. She states that the flexeril and tylenol normally relieve her migraines. No neck stiffness or fever. She feels lightheaded with standing. No recent manipulations of neck or head injuries. Pt is unsure what could have triggered the headache. Denies sinus pressure, dental pain, loss of vision, black spots in vision, emesis.   Past Medical History  Diagnosis Date  . Grave's disease   . Asthma   . Fibromyalgia    Past Surgical History  Procedure Laterality Date  . Appendectomy  2001  . Cholecystectomy  2006  . Cesarean section  04/20/2011    Procedure: CESAREAN SECTION;  Surgeon: Jeani Hawking, MD;  Location: [facility];  Service: Gynecology;  Laterality: N/A;  Primary Cesarean Section with Delivery Baby Boy @ [phone]   History reviewed. No pertinent family  history. History  Substance Use Topics  . Smoking status: Never Smoker   . Smokeless tobacco: Not on file  . Alcohol Use: No   OB History   Grav Para Term Preterm Abortions TAB SAB Ect Mult Living   0 0 0 0 0 2     Review of Systems  Constitutional: Negative for fever.  HENT: Negative for congestion, dental problem, rhinorrhea and sinus pressure.   Eyes: Positive for photophobia and visual disturbance (blurry, no loss of vision). Negative for discharge and redness.  Respiratory: Negative for shortness of breath.   Cardiovascular: Negative for chest pain.  Gastrointestinal: Positive for nausea. Negative for vomiting.  Musculoskeletal: Negative for gait problem, neck pain and neck stiffness.  Skin: Negative for rash.  Neurological: Positive for weakness, light-headedness and headaches. Negative for syncope, speech difficulty and numbness.  Psychiatric/Behavioral: Negative for confusion.   Allergies  Shrimp; Watermelon concentrate; and Latex  Home Medications   Prior to Admission medications   Medication Sig Start Date End Date Taking? Authorizing Provider  albuterol (PROVENTIL HFA;VENTOLIN HFA) 108 (90 BASE) MCG/ACT inhaler Inhale 2 puffs into the lungs every 6 (six) hours as needed for wheezing.    Historical Provider, MD  EPINEPHrine (EPI-PEN) 0.3 mg/0.3 mL SOAJ injection Inject 0.3 mg into the muscle once.    Historical Provider, MD  fluticasone (FLONASE) 50 MCG/ACT nasal spray Place 2 sprays into the nose daily.    Historical Provider, MD  levocetirizine (XYZAL) 5 MG tablet Take 5 mg by mouth every evening.    Historical Provider, MD  levonorgestrel (MIRENA) 20 MCG/24HR IUD 1 each by Intrauterine route once.    Historical Provider, MD  levothyroxine (SYNTHROID, LEVOTHROID) 150 MCG tablet Take 150 mcg by mouth daily.     Historical Provider, MD  montelukast (SINGULAIR) 10 MG tablet Take 10 mg by mouth at bedtime.    Historical Provider, MD  potassium chloride SA  (K-DUR,KLOR-CON) 20 MEQ tablet Take 1 tablet (20 mEq total) by mouth daily. 05/22/13   Linwood Dibbles, MD  predniSONE (DELTASONE) 10 MG tablet Take 10 mg by mouth 2 (two) times daily. For 5 days. On day 5 take just 1 tablet    Historical Provider, MD  PRESCRIPTION MEDICATION Inject 1 Applicatorful into the muscle every other day. Monday, Wednesday, and Friday.  Allergy shot.    Historical Provider, MD  tiZANidine (ZANAFLEX) 4 MG tablet Take 4 mg by mouth every 6 (six) hours as needed.    Historical Provider, MD   BP 119/82  Pulse 88  Temp(Src) 98.1 F (36.7 C) (Oral)  Resp 16  SpO2 100%  Physical Exam  Nursing note and vitals reviewed. Constitutional: She is oriented to person, place, and time. She appears well-developed and well-nourished. No distress.  HENT:  Head: Normocephalic and atraumatic.  Right Ear: Tympanic membrane, external ear and ear canal normal.  Left Ear: Tympanic membrane, external ear and ear canal normal.  Nose: Nose normal.  Mouth/Throat: Uvula is midline, oropharynx is clear and moist and mucous membranes are normal.  Eyes: Conjunctivae, EOM and lids are normal. Pupils are equal, round, and reactive to light. Right eye exhibits no nystagmus. Left eye exhibits no nystagmus.  Neck: Normal range of motion. Neck supple. No tracheal deviation present.  No meningeal signs.  Cardiovascular: Normal rate and regular rhythm.   Pulmonary/Chest: Effort normal and breath sounds normal. No respiratory distress.  Abdominal: Soft. There is no tenderness.  Musculoskeletal: Normal range of motion.       Cervical back: She exhibits normal range of motion, no tenderness and no bony tenderness.  Neurological: She is alert and oriented to person, place, and time. She has normal strength and normal reflexes. No cranial nerve deficit or sensory deficit. She displays a negative Romberg sign. Coordination and gait normal. GCS eye subscore is 4. GCS verbal subscore is 5. GCS motor subscore is 6.   Pt ambulates without difficulty.   Skin: Skin is warm and dry.  Psychiatric: She has a normal mood and affect. Her behavior is normal.    ED Course  Procedures (including critical care time)  DIAGNOSTIC STUDIES: Oxygen Saturation is 100% on RA, normal by my interpretation.    COORDINATION OF CARE: 2:16 PM-Discussed treatment plan which includes Reglan and benadryl with pt at bedside and pt agreed to plan.   Labs Review Labs Reviewed - No data to display  Imaging Review No results found.   EKG Interpretation None      Vital signs reviewed and are as follows: Filed Vitals:   04/24/14 1612  BP: 112/77  Pulse: 79  Temp: 98 F (36.7 C)  Resp: 15    3:12 PM No improvement with headache cocktail. Toradol ordered.   4:33 PM Pain improved to 2/10 with toradol. Patient states that she is comfortable with discharge home. She verbalizes no concerns. I encouraged her to go home and rest, followup with her primary care physician for recheck and for management  of her headaches.  Patient was counseled on head injury precautions and symptoms that should indicate their return to the ED.  These include severe worsening headache, vision changes, confusion, loss of consciousness, trouble walking, nausea & vomiting, or weakness/tingling in extremities.     MDM   Final diagnoses:  Acute nonintractable headache, unspecified headache type   Patient without high-risk features of headache including: sudden onset/thunderclap HA, no similar headache in past, altered mental status, accompanying seizure, headache with exertion, age > 2, history of immunocompromise, neck or shoulder pain, fever, use of anticoagulation, family history of spontaneous SAH, concomitant drug use, toxic exposure.   Patient has a normal complete neurological exam, normal vital signs, normal level of consciousness, no signs of meningismus, is well-appearing/non-toxic appearing, no signs of trauma, no pain over the  temporal arteries.   Imaging with CT/MRI not indicated given history and physical exam findings.   No dangerous or life-threatening conditions suspected or identified by history, physical exam, and by work-up. No indications for hospitalization identified.    I personally performed the services described in this documentation, which was scribed in my presence. The recorded information has been reviewed and is accurate.  Renne Crigler, PA-C 04/24/14 [phone]

## 2014-04-24 NOTE — ED Notes (Signed)
Declined W/C at D/C and was escorted to lobby by RN. 

## 2014-04-24 NOTE — ED Notes (Addendum)
The patient says she has been diagnosed with migraines.  She says it started tuesday and nothing has worked.  She has been prescribed muscles relaxers for the migraines and she took one last night and it did not help.  Patient has been nauseated.

## 2014-04-24 NOTE — Discharge Instructions (Signed)
Please read and follow all provided instructions.  Your diagnoses today include:  1. Acute nonintractable headache, unspecified headache type     Tests performed today include:  Vital signs. See below for your results today.   Medications:  In the Emergency Department you received:  Reglan - antinausea/headache medication  Benadryl - antihistamine to counteract potential side effects of reglan  Toradol - NSAID medication similar to ibuprofen  Take any prescribed medications only as directed.  Additional information:  Follow any educational materials contained in this packet.  You are having a headache. No specific cause was found today for your headache. It may have been a migraine or other cause of headache. Stress, anxiety, fatigue, and depression are common triggers for headaches.   Your headache today does not appear to be life-threatening or require hospitalization, but often the exact cause of headaches is not determined in the emergency department. Therefore, follow-up with your doctor is very important to find out what may have caused your headache and whether or not you need any further diagnostic testing or treatment.   Sometimes headaches can appear benign (not harmful), but then more serious symptoms can develop which should prompt an immediate re-evaluation by your doctor or the emergency department.  BE VERY CAREFUL not to take multiple medicines containing Tylenol (also called acetaminophen). Doing so can lead to an overdose which can damage your liver and cause liver failure and possibly death.   Follow-up instructions: Please follow-up with your primary care provider in the next 3 days for further evaluation of your symptoms.   Return instructions:   Please return to the Emergency Department if you experience worsening symptoms.  Return if the medications do not resolve your headache, if it recurs, or if you have multiple episodes of vomiting or cannot keep  down fluids.  Return if you have a change from the usual headache.  RETURN IMMEDIATELY IF you:  Develop a sudden, severe headache  Develop confusion or become poorly responsive or faint  Develop a fever above 100.89F or problem breathing  Have a change in speech, vision, swallowing, or understanding  Develop new weakness, numbness, tingling, incoordination in your arms or legs  Have a seizure  Please return if you have any other emergent concerns.  Additional Information:  Your vital signs today were: BP 112/77   Pulse 79   Temp(Src) 98 F (36.7 C) (Oral)   Resp 15   SpO2 100% If your blood pressure (BP) was elevated above 135/85 this visit, please have this repeated by your doctor within one month. --------------

## 2014-04-24 NOTE — ED Provider Notes (Signed)
Medical screening examination/treatment/procedure(s) were performed by non-physician practitioner and as supervising physician I was immediately available for consultation/collaboration.   EKG Interpretation None       Flint Melter, MD 04/24/14 2011

## 2014-04-27 ENCOUNTER — Emergency Department (HOSPITAL_COMMUNITY)
Admission: EM | Admit: 2014-04-27 | Discharge: 2014-04-27 | Disposition: A | Discharge status: Home / Self Care | Payer: Managed Care, Other (non HMO) | Attending: Emergency Medicine | Admitting: Emergency Medicine

## 2014-04-27 ENCOUNTER — Emergency Department (HOSPITAL_COMMUNITY): Payer: Managed Care, Other (non HMO)

## 2014-04-27 ENCOUNTER — Encounter (HOSPITAL_COMMUNITY): Payer: Self-pay | Admitting: Emergency Medicine

## 2014-04-27 DIAGNOSIS — H538 Other visual disturbances: Secondary | ICD-10-CM | POA: Diagnosis not present

## 2014-04-27 DIAGNOSIS — Z79899 Other long term (current) drug therapy: Secondary | ICD-10-CM | POA: Insufficient documentation

## 2014-04-27 DIAGNOSIS — R51 Headache: Secondary | ICD-10-CM | POA: Insufficient documentation

## 2014-04-27 DIAGNOSIS — R52 Pain, unspecified: Secondary | ICD-10-CM | POA: Diagnosis not present

## 2014-04-27 DIAGNOSIS — R29810 Facial weakness: Secondary | ICD-10-CM | POA: Diagnosis not present

## 2014-04-27 DIAGNOSIS — Z3202 Encounter for pregnancy test, result negative: Secondary | ICD-10-CM | POA: Diagnosis not present

## 2014-04-27 DIAGNOSIS — N390 Urinary tract infection, site not specified: Secondary | ICD-10-CM

## 2014-04-27 DIAGNOSIS — Z91040 Latex allergy status: Secondary | ICD-10-CM | POA: Insufficient documentation

## 2014-04-27 DIAGNOSIS — R110 Nausea: Secondary | ICD-10-CM | POA: Diagnosis not present

## 2014-04-27 DIAGNOSIS — J45909 Unspecified asthma, uncomplicated: Secondary | ICD-10-CM | POA: Diagnosis not present

## 2014-04-27 DIAGNOSIS — Z8739 Personal history of other diseases of the musculoskeletal system and connective tissue: Secondary | ICD-10-CM | POA: Diagnosis not present

## 2014-04-27 DIAGNOSIS — E0500 Thyrotoxicosis with diffuse goiter without thyrotoxic crisis or storm: Secondary | ICD-10-CM | POA: Diagnosis not present

## 2014-04-27 DIAGNOSIS — R519 Headache, unspecified: Secondary | ICD-10-CM

## 2014-04-27 LAB — URINALYSIS, ROUTINE W REFLEX MICROSCOPIC
Bilirubin Urine: NEGATIVE
GLUCOSE, UA: NEGATIVE mg/dL
KETONES UR: NEGATIVE mg/dL
LEUKOCYTES UA: NEGATIVE
NITRITE: POSITIVE — AB
Protein, ur: NEGATIVE mg/dL
Specific Gravity, Urine: 1.021 (ref 1.005–1.030)
Urobilinogen, UA: 0.2 mg/dL (ref 0.0–1.0)
pH: 6 (ref 5.0–8.0)

## 2014-04-27 LAB — URINE MICROSCOPIC-ADD ON

## 2014-04-27 LAB — CBC WITH DIFFERENTIAL/PLATELET
BASOS ABS: 0.1 10*3/uL (ref 0.0–0.1)
Basophils Relative: 1 % (ref 0–1)
EOS ABS: 0.1 10*3/uL (ref 0.0–0.7)
Eosinophils Relative: 2 % (ref 0–5)
HCT: 38.5 % (ref 36.0–46.0)
Hemoglobin: 13.1 g/dL (ref 12.0–15.0)
Lymphocytes Relative: 35 % (ref 12–46)
Lymphs Abs: 2 10*3/uL (ref 0.7–4.0)
MCH: 28.7 pg (ref 26.0–34.0)
MCHC: 34 g/dL (ref 30.0–36.0)
MCV: 84.2 fL (ref 78.0–100.0)
Monocytes Absolute: 0.3 10*3/uL (ref 0.1–1.0)
Monocytes Relative: 6 % (ref 3–12)
Neutro Abs: 3.2 10*3/uL (ref 1.7–7.7)
Neutrophils Relative %: 56 % (ref 43–77)
PLATELETS: 231 10*3/uL (ref 150–400)
RBC: 4.57 MIL/uL (ref 3.87–5.11)
RDW: 14 % (ref 11.5–15.5)
WBC: 5.8 10*3/uL (ref 4.0–10.5)

## 2014-04-27 LAB — BASIC METABOLIC PANEL
ANION GAP: 11 (ref 5–15)
BUN: 15 mg/dL (ref 6–23)
CALCIUM: 8.6 mg/dL (ref 8.4–10.5)
CO2: 27 mEq/L (ref 19–32)
Chloride: 101 mEq/L (ref 96–112)
Creatinine, Ser: 1 mg/dL (ref 0.50–1.10)
GFR calc Af Amer: 83 mL/min — ABNORMAL LOW (ref >90)
GFR, EST NON AFRICAN AMERICAN: 72 mL/min — AB (ref >90)
Glucose, Bld: 103 mg/dL — ABNORMAL HIGH (ref 70–99)
Potassium: 3.7 mEq/L (ref 3.7–5.3)
SODIUM: 139 meq/L (ref 137–147)

## 2014-04-27 LAB — POC URINE PREG, ED: Preg Test, Ur: NEGATIVE

## 2014-04-27 MED ORDER — DEXAMETHASONE SODIUM PHOSPHATE 10 MG/ML IJ SOLN
10.0000 mg | Freq: Once | INTRAMUSCULAR | Status: AC
  Administered 2014-04-27: 10 mg via INTRAVENOUS
  Filled 2014-04-27: qty 1

## 2014-04-27 MED ORDER — KETOROLAC TROMETHAMINE 30 MG/ML IJ SOLN
30.0000 mg | Freq: Once | INTRAMUSCULAR | Status: AC
  Administered 2014-04-27: 30 mg via INTRAVENOUS
  Filled 2014-04-27: qty 1

## 2014-04-27 MED ORDER — DIPHENHYDRAMINE HCL 50 MG/ML IJ SOLN
25.0000 mg | Freq: Once | INTRAMUSCULAR | Status: AC
  Administered 2014-04-27: 25 mg via INTRAVENOUS
  Filled 2014-04-27: qty 1

## 2014-04-27 MED ORDER — OXYCODONE-ACETAMINOPHEN 5-325 MG PO TABS
1.0000 | ORAL_TABLET | Freq: Once | ORAL | Status: AC
  Administered 2014-04-27: 1 via ORAL
  Filled 2014-04-27: qty 1

## 2014-04-27 MED ORDER — ACETAMINOPHEN 500 MG PO TABS
1000.0000 mg | ORAL_TABLET | Freq: Once | ORAL | Status: AC
  Administered 2014-04-27: 1000 mg via ORAL
  Filled 2014-04-27: qty 2

## 2014-04-27 MED ORDER — CEPHALEXIN 500 MG PO CAPS: 500.0000 mg | ORAL_CAPSULE | Freq: Two times a day (BID) | ORAL | Status: DC

## 2014-04-27 MED ORDER — SODIUM CHLORIDE 0.9 % IV BOLUS (SEPSIS)
1000.0000 mL | Freq: Once | INTRAVENOUS | Status: AC
  Administered 2014-04-27: 1000 mL via INTRAVENOUS

## 2014-04-27 MED ORDER — METOCLOPRAMIDE HCL 5 MG/ML IJ SOLN
10.0000 mg | Freq: Once | INTRAMUSCULAR | Status: AC
  Administered 2014-04-27: 10 mg via INTRAVENOUS
  Filled 2014-04-27: qty 2

## 2014-04-27 MED ORDER — CEPHALEXIN 250 MG PO CAPS
500.0000 mg | ORAL_CAPSULE | Freq: Once | ORAL | Status: AC
  Administered 2014-04-27: 500 mg via ORAL
  Filled 2014-04-27: qty 2

## 2014-04-27 NOTE — ED Notes (Signed)
Patient returned from CT

## 2014-04-27 NOTE — Discharge Instructions (Signed)
Headaches, Frequently Asked Questions °MIGRAINE HEADACHES °Q: What is migraine? What causes it? How can I treat it? °A: Generally, migraine headaches begin as a dull ache. Then they develop into a constant, throbbing, and pulsating pain. You may experience pain at the temples. You may experience pain at the front or back of one or both sides of the head. The pain is usually accompanied by a combination of: °· Nausea. °· Vomiting. °· Sensitivity to light and noise. °Some people (about 15%) experience an aura (see below) before an attack. The cause of migraine is believed to be chemical reactions in the brain. Treatment for migraine may include over-the-counter or prescription medications. It may also include self-help techniques. These include relaxation training and biofeedback.  °Q: What is an aura? °A: About 15% of people with migraine get an "aura". This is a sign of neurological symptoms that occur before a migraine headache. You may see wavy or jagged lines, dots, or flashing lights. You might experience tunnel vision or blind spots in one or both eyes. The aura can include visual or auditory hallucinations (something imagined). It may include disruptions in smell (such as strange odors), taste or touch. Other symptoms include: °· Numbness. °· A "pins and needles" sensation. °· Difficulty in recalling or speaking the correct word. °These neurological events may last as long as 60 minutes. These symptoms will fade as the headache begins. °Q: What is a trigger? °A: Certain physical or environmental factors can lead to or "trigger" a migraine. These include: °· Foods. °· Hormonal changes. °· Weather. °· Stress. °It is important to remember that triggers are different for everyone. To help prevent migraine attacks, you need to figure out which triggers affect you. Keep a headache diary. This is a good way to track triggers. The diary will help you talk to your healthcare professional about your condition. °Q: Does  weather affect migraines? °A: Bright sunshine, hot, humid conditions, and drastic changes in barometric pressure may lead to, or "trigger," a migraine attack in some people. But studies have shown that weather does not act as a trigger for everyone with migraines. °Q: What is the link between migraine and hormones? °A: Hormones start and regulate many of your body's functions. Hormones keep your body in balance within a constantly changing environment. The levels of hormones in your body are unbalanced at times. Examples are during menstruation, pregnancy, or menopause. That can lead to a migraine attack. In fact, about three quarters of all women with migraine report that their attacks are related to the menstrual cycle.  °Q: Is there an increased risk of stroke for migraine sufferers? °A: The likelihood of a migraine attack causing a stroke is very remote. That is not to say that migraine sufferers cannot have a stroke associated with their migraines. In persons under age 40, the most common associated factor for stroke is migraine headache. But over the course of a person's normal life span, the occurrence of migraine headache may actually be associated with a reduced risk of dying from cerebrovascular disease due to stroke.  °Q: What are acute medications for migraine? °A: Acute medications are used to treat the pain of the headache after it has started. Examples over-the-counter medications, NSAIDs, ergots, and triptans.  °Q: What are the triptans? °A: Triptans are the newest class of abortive medications. They are specifically targeted to treat migraine. Triptans are vasoconstrictors. They moderate some chemical reactions in the brain. The triptans work on receptors in your brain. Triptans help   to restore the balance of a neurotransmitter called serotonin. Fluctuations in levels of serotonin are thought to be a main cause of migraine.  °Q: Are over-the-counter medications for migraine effective? °A:  Over-the-counter, or "OTC," medications may be effective in relieving mild to moderate pain and associated symptoms of migraine. But you should see your caregiver before beginning any treatment regimen for migraine.  °Q: What are preventive medications for migraine? °A: Preventive medications for migraine are sometimes referred to as "prophylactic" treatments. They are used to reduce the frequency, severity, and length of migraine attacks. Examples of preventive medications include antiepileptic medications, antidepressants, beta-blockers, calcium channel blockers, and NSAIDs (nonsteroidal anti-inflammatory drugs). °Q: Why are anticonvulsants used to treat migraine? °A: During the past few years, there has been an increased interest in antiepileptic drugs for the prevention of migraine. They are sometimes referred to as "anticonvulsants". Both epilepsy and migraine may be caused by similar reactions in the brain.  °Q: Why are antidepressants used to treat migraine? °A: Antidepressants are typically used to treat people with depression. They may reduce migraine frequency by regulating chemical levels, such as serotonin, in the brain.  °Q: What alternative therapies are used to treat migraine? °A: The term "alternative therapies" is often used to describe treatments considered outside the scope of conventional Western medicine. Examples of alternative therapy include acupuncture, acupressure, and yoga. Another common alternative treatment is herbal therapy. Some herbs are believed to relieve headache pain. Always discuss alternative therapies with your caregiver before proceeding. Some herbal products contain arsenic and other toxins. °TENSION HEADACHES °Q: What is a tension-type headache? What causes it? How can I treat it? °A: Tension-type headaches occur randomly. They are often the result of temporary stress, anxiety, fatigue, or anger. Symptoms include soreness in your temples, a tightening band-like sensation  around your head (a "vice-like" ache). Symptoms can also include a pulling feeling, pressure sensations, and contracting head and neck muscles. The headache begins in your forehead, temples, or the back of your head and neck. Treatment for tension-type headache may include over-the-counter or prescription medications. Treatment may also include self-help techniques such as relaxation training and biofeedback. °CLUSTER HEADACHES °Q: What is a cluster headache? What causes it? How can I treat it? °A: Cluster headache gets its name because the attacks come in groups. The pain arrives with little, if any, warning. It is usually on one side of the head. A tearing or bloodshot eye and a runny nose on the same side of the headache may also accompany the pain. Cluster headaches are believed to be caused by chemical reactions in the brain. They have been described as the most severe and intense of any headache type. Treatment for cluster headache includes prescription medication and oxygen. °SINUS HEADACHES °Q: What is a sinus headache? What causes it? How can I treat it? °A: When a cavity in the bones of the face and skull (a sinus) becomes inflamed, the inflammation will cause localized pain. This condition is usually the result of an allergic reaction, a tumor, or an infection. If your headache is caused by a sinus blockage, such as an infection, you will probably have a fever. An x-ray will confirm a sinus blockage. Your caregiver's treatment might include antibiotics for the infection, as well as antihistamines or decongestants.  °REBOUND HEADACHES °Q: What is a rebound headache? What causes it? How can I treat it? °A: A pattern of taking acute headache medications too often can lead to a condition known as "rebound headache."   A pattern of taking too much headache medication includes taking it more than 2 days per week or in excessive amounts. That means more than the label or a caregiver advises. With rebound  headaches, your medications not only stop relieving pain, they actually begin to cause headaches. Doctors treat rebound headache by tapering the medication that is being overused. Sometimes your caregiver will gradually substitute a different type of treatment or medication. Stopping may be a challenge. Regularly overusing a medication increases the potential for serious side effects. Consult a caregiver if you regularly use headache medications more than 2 days per week or more than the label advises. °ADDITIONAL QUESTIONS AND ANSWERS °Q: What is biofeedback? °A: Biofeedback is a self-help treatment. Biofeedback uses special equipment to monitor your body's involuntary physical responses. Biofeedback monitors: °· Breathing. °· Pulse. °· Heart rate. °· Temperature. °· Muscle tension. °· Brain activity. °Biofeedback helps you refine and perfect your relaxation exercises. You learn to control the physical responses that are related to stress. Once the technique has been mastered, you do not need the equipment any more. °Q: Are headaches hereditary? °A: Four out of five (80%) of people that suffer report a family history of migraine. Scientists are not sure if this is genetic or a family predisposition. Despite the uncertainty, a child has a 50% chance of having migraine if one parent suffers. The child has a 75% chance if both parents suffer.  °Q: Can children get headaches? °A: By the time they reach high school, most young people have experienced some type of headache. Many safe and effective approaches or medications can prevent a headache from occurring or stop it after it has begun.  °Q: What type of doctor should I see to diagnose and treat my headache? °A: Start with your primary caregiver. Discuss his or her experience and approach to headaches. Discuss methods of classification, diagnosis, and treatment. Your caregiver may decide to recommend you to a headache specialist, depending upon your symptoms or other  physical conditions. Having diabetes, allergies, etc., may require a more comprehensive and inclusive approach to your headache. The [employer] will provide, upon request, a list of NHF physician members in your state. °Document Released: 10/29/2003 Document Revised: 10/31/2011 Document Reviewed: 04/07/2008 °ExitCare® Patient Information ©2015 [employer]. This information is not intended to replace advice given to you by your health care provider. Make sure you discuss any questions you have with your health care provider. ° °Urinary Tract Infection °Urinary tract infections (UTIs) can develop anywhere along your urinary tract. Your urinary tract is your body's drainage system for removing wastes and extra water. Your urinary tract includes two kidneys, two ureters, a bladder, and a urethra. Your kidneys are a pair of bean-shaped organs. Each kidney is about the size of your fist. They are located below your ribs, one on each side of your spine. °CAUSES °Infections are caused by microbes, which are microscopic organisms, including fungi, viruses, and bacteria. These organisms are so small that they can only be seen through a microscope. Bacteria are the microbes that most commonly cause UTIs. °SYMPTOMS  °Symptoms of UTIs may vary by age and gender of the patient and by the location of the infection. Symptoms in young women typically include a frequent and intense urge to urinate and a painful, burning feeling in the bladder or urethra during urination. Older women and men are more likely to be tired, shaky, and weak and have muscle aches and abdominal pain. A fever may   mean the infection is in your kidneys. Other symptoms of a kidney infection include pain in your back or sides below the ribs, nausea, and vomiting. °DIAGNOSIS °To diagnose a UTI, your caregiver will ask you about your symptoms. Your caregiver also will ask to provide a urine sample. The urine sample will be tested for bacteria  and white blood cells. White blood cells are made by your body to help fight infection. °TREATMENT  °Typically, UTIs can be treated with medication. Because most UTIs are caused by a bacterial infection, they usually can be treated with the use of antibiotics. The choice of antibiotic and length of treatment depend on your symptoms and the type of bacteria causing your infection. °HOME CARE INSTRUCTIONS °· If you were prescribed antibiotics, take them exactly as your caregiver instructs you. Finish the medication even if you feel better after you have only taken some of the medication. °· Drink enough water and fluids to keep your urine clear or pale yellow. °· Avoid caffeine, tea, and carbonated beverages. They tend to irritate your bladder. °· Empty your bladder often. Avoid holding urine for long periods of time. °· Empty your bladder before and after sexual intercourse. °· After a bowel movement, women should cleanse from front to back. Use each tissue only once. °SEEK MEDICAL CARE IF:  °· You have back pain. °· You develop a fever. °· Your symptoms do not begin to resolve within 3 days. °SEEK IMMEDIATE MEDICAL CARE IF:  °· You have severe back pain or lower abdominal pain. °· You develop chills. °· You have nausea or vomiting. °· You have continued burning or discomfort with urination. °MAKE SURE YOU:  °· Understand these instructions. °· Will watch your condition. °· Will get help right away if you are not doing well or get worse. °Document Released: 05/18/2005 Document Revised: 02/07/2012 Document Reviewed: 09/16/2011 °ExitCare® Patient Information ©2015 [employer]. This information is not intended to replace advice given to you by your health care provider. Make sure you discuss any questions you have with your health care provider. ° °

## 2014-04-27 NOTE — ED Notes (Signed)
Pt was seen here on 9/3 for same. Having migraine that returned on Friday. Having nausea, no vomiting. No relief with tylenol this am.

## 2014-04-27 NOTE — ED Provider Notes (Signed)
CSN: [id number]     Arrival date & time 04/27/14  1346 History   First MD Initiated Contact with Patient 04/27/14 1721     Chief Complaint  Patient presents with  . Migraine     (Consider location/radiation/quality/duration/timing/severity/associated sxs/prior Treatment) Patient is a 38 y.o. female presenting with headaches.  Headache Pain location:  Frontal Quality: pressure. Pain severity now: severe. Onset quality:  Gradual Duration:  2 days Timing:  Constant Progression:  Worsening Chronicity:  Recurrent Similar to prior headaches: no   Context comment:  Seen in ED a few days ago.  HA resolved for about 24 hours, then returned gradually Relieved by:  Nothing Exacerbated by: standing up, light, noise. Associated symptoms: blurred vision, focal weakness (left sided), nausea and photophobia   Associated symptoms: no fever and no vomiting     Past Medical History  Diagnosis Date  . Grave's disease   . Asthma   . Fibromyalgia    Past Surgical History  Procedure Laterality Date  . Appendectomy  2001  . Cholecystectomy  2006  . Cesarean section  04/20/2011    Procedure: CESAREAN SECTION;  Surgeon: Jeani Hawking, MD;  Location: [facility];  Service: Gynecology;  Laterality: N/A;  Primary Cesarean Section with Delivery Baby Boy @ [phone]   History reviewed. No pertinent family history. History  Substance Use Topics  . Smoking status: Never Smoker   . Smokeless tobacco: Not on file  . Alcohol Use: No   OB History   Grav Para Term Preterm Abortions TAB SAB Ect Mult Living   0 0 0 0 0 2     Review of Systems  Constitutional: Negative for fever.  Eyes: Positive for blurred vision and photophobia.  Gastrointestinal: Positive for nausea. Negative for vomiting.  Neurological: Positive for focal weakness (left sided) and headaches.  All other systems reviewed and are negative.     Allergies  Shrimp; Watermelon concentrate; and Latex  Home Medications   Prior  to Admission medications   Medication Sig Start Date End Date Taking? Authorizing Provider  albuterol (PROVENTIL HFA;VENTOLIN HFA) 108 (90 BASE) MCG/ACT inhaler Inhale 2 puffs into the lungs every 6 (six) hours as needed for wheezing.   Yes Historical Provider, MD  DULoxetine (CYMBALTA) 30 MG capsule Take 30 mg by mouth 2 (two) times daily.   Yes Historical Provider, MD  EPINEPHrine (EPI-PEN) 0.3 mg/0.3 mL SOAJ injection Inject 0.3 mg into the muscle once.   Yes Historical Provider, MD  fluticasone (FLONASE) 50 MCG/ACT nasal spray Place 2 sprays into the nose daily.   Yes Historical Provider, MD  levocetirizine (XYZAL) 5 MG tablet Take 5 mg by mouth every evening.   Yes Historical Provider, MD  levonorgestrel (MIRENA) 20 MCG/24HR IUD 1 each by Intrauterine route once.   Yes Historical Provider, MD  levothyroxine (SYNTHROID, LEVOTHROID) 150 MCG tablet Take 150 mcg by mouth daily.    Yes Historical Provider, MD  montelukast (SINGULAIR) 10 MG tablet Take 10 mg by mouth at bedtime.   Yes Historical Provider, MD   BP 119/79  Pulse 95  Temp(Src) 98.5 F (36.9 C) (Oral)  Resp 21  Ht  (1.6 m)  Wt 167 lb 3 oz (75.836 kg)  BMI 29.62 kg/m2  SpO2 100% Physical Exam  Nursing note and vitals reviewed. Constitutional: She is oriented to person, place, and time. She appears well-developed and well-nourished. No distress.  HENT:  Head: Normocephalic and atraumatic.  Mouth/Throat: Oropharynx is clear  and moist.  Eyes: Conjunctivae and EOM are normal. Pupils are equal, round, and reactive to light. No scleral icterus.  Fundoscopic exam:      The right eye shows no papilledema.       The left eye shows no papilledema.  Neck: Neck supple.  Cardiovascular: Normal rate, regular rhythm, normal heart sounds and intact distal pulses.   No murmur heard. Pulmonary/Chest: Effort normal and breath sounds normal. No stridor. No respiratory distress. She has no rales.  Abdominal: Soft. Bowel sounds are  normal. She exhibits no distension. There is no tenderness.  Musculoskeletal: Normal range of motion.  Neurological: She is alert and oriented to person, place, and time. She has normal strength. No cranial nerve deficit or sensory deficit. Coordination and gait normal. GCS eye subscore is 4. GCS verbal subscore is 5. GCS motor subscore is 6.  Reflex Scores:      Patellar reflexes are 2+ on the right side and 2+ on the left side. Skin: Skin is warm and dry. No rash noted.  Psychiatric: She has a normal mood and affect. Her behavior is normal.    ED Course  Procedures (including critical care time) Labs Review Labs Reviewed  BASIC METABOLIC PANEL - Abnormal; Notable for the following:    Glucose, Bld 103 (*)    GFR calc non Af Amer 72 (*)    GFR calc Af Amer 83 (*)    All other components within normal limits  URINALYSIS, ROUTINE W REFLEX MICROSCOPIC - Abnormal; Notable for the following:    APPearance CLOUDY (*)    Hgb urine dipstick SMALL (*)    Nitrite POSITIVE (*)    All other components within normal limits  URINE MICROSCOPIC-ADD ON - Abnormal; Notable for the following:    Squamous Epithelial / LPF FEW (*)    Bacteria, UA MANY (*)    All other components within normal limits  CBC WITH DIFFERENTIAL  POC URINE PREG, ED    Imaging Review Ct Head Wo Contrast  04/27/2014   CLINICAL DATA:  Migraine headache.  Vargus disease.  Fibromyalgia.  EXAM: CT HEAD WITHOUT CONTRAST  TECHNIQUE: Contiguous axial images were obtained from the base of the skull through the vertex without intravenous contrast.  COMPARISON:  None.  FINDINGS: The brainstem, cerebellum, cerebral peduncles, thalamus, basal ganglia, basilar cisterns, and ventricular system appear within normal limits. No intracranial hemorrhage, mass lesion, or acute CVA. Visualized paranasal sinuses appear clear.  IMPRESSION: No significant abnormality identified.   Electronically Signed   By: Herbie Baltimore M.D.   On: 04/27/2014 18:25   All radiology studies independently viewed by me.      EKG Interpretation None      MDM   Final diagnoses:  Acute intractable headache, unspecified headache type  UTI (lower urinary tract infection)    HA recurred about 24 hours after leaving the ED a few days ago.  Described as gradual onset.  No meningeal signs. No fevers.  No papilledema.  Low suspicion for SAH, meningitis, or sinus thrombosis.  HA, however, described as different than typical.  She describes some left sided weakness, but I do not detect any weakness on strength or gait testing.  Plan head CT, labs, and IV metoclopramide, diphenhydramine, dexamethasone, and fluids.  Head CT unremarkable.  Labs notable for UTI.  This probably triggered her headaches.  Will treat with keflex.  Pt feels a little better and wants to go home.  Advised pcp follow up.  Candyce Churn III, MD 04/27/14 2016

## 2014-06-23 ENCOUNTER — Encounter (HOSPITAL_COMMUNITY): Payer: Self-pay | Admitting: Emergency Medicine

## 2014-07-02 ENCOUNTER — Encounter: Payer: Self-pay | Admitting: Neurology

## 2014-07-02 ENCOUNTER — Ambulatory Visit (INDEPENDENT_AMBULATORY_CARE_PROVIDER_SITE_OTHER): Payer: Managed Care, Other (non HMO) | Admitting: Neurology

## 2014-07-02 VITALS — BP 123/88 | HR 79 | Ht 63.0 in | Wt 169.2 lb

## 2014-07-02 DIAGNOSIS — R51 Headache: Secondary | ICD-10-CM

## 2014-07-02 DIAGNOSIS — G44221 Chronic tension-type headache, intractable: Secondary | ICD-10-CM

## 2014-07-02 DIAGNOSIS — R519 Headache, unspecified: Secondary | ICD-10-CM

## 2014-07-02 HISTORY — DX: Headache, unspecified: R51.9

## 2014-07-02 MED ORDER — RIZATRIPTAN BENZOATE 10 MG PO TABS: 10.0000 mg | ORAL_TABLET | Freq: Three times a day (TID) | ORAL | Status: DC | PRN

## 2014-07-02 NOTE — Progress Notes (Signed)
Reason for visit: headache  Ernestina Patchesiffany R Queener is a 38 y.o. female  History of present illness:  [name] is a 38 year old right-handed black female with a history of headaches since her high school years. The patient indicates that over the last 2 years, the headaches have become more frequent. The patient is virtually having daily headaches and the headache quality has changed. She has been seen previously through [facility], and she has undergone treatment with Botox that was beneficial, but seemed to wear off before the next injection. The patient was given a trial on Topamax, but she could not tolerate this drug. Another medication was tried, but she could not remember the name of the medication. She indicates that her current headaches are better in the morning, worse as the day goes on. She has neck discomfort, left greater than right, and the pain spreads up into the back of the head and around with a tight pressure feeling. The patient may have some photophobia and phonophobia with the headache and some nausea without vomiting. She will have some blurring of vision, occasionally she will have spots in front eyes. She indicates that bright lights may bother her, but there are no other significant activating factors for her headache. She recently has been placed on gabapentin within the last one month, currently on 600 mg 3 times daily with good benefit. As long she stays on the medication, her headaches are not severe. The patient has gained benefit from Excedrin Migraine in the past, but this is no longer effective. She takes Flexeril for her headache, without much benefit. She is sent to this office for further evaluation. She does report some discomfort down both arms, some numbness in the arms and hands and a sensation of weakness in the legs. She has some mild gait instability, and some urinary urgency at times. She denies any falls.  Past Medical History  Diagnosis Date   . Grave's disease   . Asthma   . Fibromyalgia   . Headache 07/02/2014    Past Surgical History  Procedure Laterality Date  . Appendectomy  2001  . Cholecystectomy  2006  . Cesarean section  04/20/2011    Procedure: CESAREAN SECTION;  Surgeon: Jeani HawkingMichelle L Grewal, MD;  Location: [facility];  Service: Gynecology;  Laterality: N/A;  Primary Cesarean Section with Delivery Baby Boy @ 0421    Family History  Problem Relation Age of Onset  . Hypertension Mother   . Breast cancer Maternal Grandmother   . Migraines Brother   . Migraines Sister     Social history:  reports that she has never smoked. She has never used smokeless tobacco. She reports that she does not drink alcohol or use illicit drugs.  Medications:  Current Outpatient Prescriptions on File Prior to Visit  Medication Sig Dispense Refill  . albuterol (PROVENTIL HFA;VENTOLIN HFA) 108 (90 BASE) MCG/ACT inhaler Inhale 2 puffs into the lungs every 6 (six) hours as needed for wheezing.    Marland Kitchen. EPINEPHrine (EPI-PEN) 0.3 mg/0.3 mL SOAJ injection Inject 0.3 mg into the muscle once.    . fluticasone (FLONASE) 50 MCG/ACT nasal spray Place 2 sprays into the nose daily.    Marland Kitchen. levocetirizine (XYZAL) 5 MG tablet Take 5 mg by mouth every evening.    Marland Kitchen. levonorgestrel (MIRENA) 20 MCG/24HR IUD 1 each by Intrauterine route once.    . montelukast (SINGULAIR) 10 MG tablet Take 10 mg by mouth at bedtime.     No  current facility-administered medications on file prior to visit.      Allergies  Allergen Reactions  . Shrimp [Shellfish Allergy] Hives, Shortness Of Breath and Swelling  . Watermelon Concentrate Hives, Shortness Of Breath and Swelling  . Latex Rash    ROS:  Out of a complete 14 system review of symptoms, the patient complains only of the following symptoms, and all other reviewed systems are negative.  Weight loss, fatigue Chest pain, palpitations Difficulty swallowing Blurred vision, eye pain Cough, wheezing,  snoring Constipation Easy bruising Feeling cold, increased thirst Joint pain, cramps, aching muscles Allergies, skin sensitivity Memory loss, headache, numbness, weakness, difficulty swallowing, dizziness, passing out Depression, anxiety, insomnia, decreased energy, change in appetite, disinterest in activities, racing thoughts Sleepiness, snoring, restless legs  Blood pressure 123/88, pulse 79, height 5\' 3"  (1.6 m), weight 169 lb 3.2 oz (76.749 kg).  Physical Exam  General: The patient is alert and cooperative at the time of the examination.  Eyes: Pupils are equal, round, and reactive to light. Discs are flat bilaterally.  Neck: The neck is supple, no carotid bruits are noted.  Respiratory: The respiratory examination is clear.  Cardiovascular: The cardiovascular examination reveals a regular rate and rhythm, no obvious murmurs or rubs are noted.  Neuromuscular: Range of movement of the cervical spine is full.  Skin: Extremities are without significant edema.  Neurologic Exam  Mental status: The patient is alert and oriented x 3 at the time of the examination. The patient has apparent normal recent and remote memory, with an apparently normal attention span and concentration ability.  Cranial nerves: Facial symmetry is present. There is good sensation of the face to pinprick and soft touch bilaterally. The strength of the facial muscles and the muscles to head turning and shoulder shrug are normal bilaterally. Speech is well enunciated, no aphasia or dysarthria is noted. Extraocular movements are full. Visual fields are full. The tongue is midline, and the patient has symmetric elevation of the soft palate. No obvious hearing deficits are noted.  Motor: The motor testing reveals 5 over 5 strength of all 4 extremities. Good symmetric motor tone is noted throughout.  Sensory: Sensory testing is intact to pinprick, soft touch, vibration sensation, and position sense on all 4  extremities. No evidence of extinction is noted.  Coordination: Cerebellar testing reveals good finger-nose-finger and heel-to-shin bilaterally.  Gait and station: Gait is normal. Tandem gait is normal. Romberg is negative. No drift is seen.  Reflexes: Deep tendon reflexes are symmetric and normal bilaterally in the upper extremities, brisk reflexes are noted in the lower extremities. Toes are downgoing bilaterally.   CT head 04/27/14:  IMPRESSION: No significant abnormality identified   Assessment/Plan:  1. 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 The patient will be sent for neuromuscular therapy of the cervical spine and shoulders. She will continue the gabapentin, and she will be given a trial on Maxalt to see if this does help her headaches. The patient may require MRI evaluation of the cervical spine in the future if her headaches continue. She will follow-up in about 3 months.  Marlan Palau. Keith Willis MD 07/02/2014 7:34 PM  [facility] [address] [place], [place] [id number]  Phone [phone] Fax [phone]

## 2014-07-02 NOTE — Patient Instructions (Signed)

## 2014-09-02 ENCOUNTER — Encounter: Payer: Self-pay | Admitting: Physical Therapy

## 2014-09-02 ENCOUNTER — Ambulatory Visit: Payer: 59 | Attending: Neurology | Admitting: Physical Therapy

## 2014-09-02 DIAGNOSIS — M542 Cervicalgia: Secondary | ICD-10-CM | POA: Diagnosis present

## 2014-09-02 NOTE — Therapy (Signed)
[facility] [address] [place], [place], [address] Phone: [phone]   Fax:  [phone]  Physical Therapy Evaluation  Patient Details  Name: Lisa Day MRN: [id number] Date of Birth: 1975/12/18 Referring Provider:  York Spaniel, MD  Encounter Date: 09/02/2014      PT End of Session - 09/02/14 2121    Visit Number 1   Number of Visits 9   Date for PT Re-Evaluation 10/09/14   PT Start Time 1534   PT Stop Time 1420   PT Time Calculation (min) 1366 min      Past Medical History  Diagnosis Date  . Grave's disease   . Asthma   . Fibromyalgia   . Headache 07/02/2014    Past Surgical History  Procedure Laterality Date  . Appendectomy  2001  . Cholecystectomy  2006  . Cesarean section  04/20/2011    Procedure: CESAREAN SECTION;  Surgeon: Jeani Hawking, MD;  Location: [facility];  Service: Gynecology;  Laterality: N/A;  Primary Cesarean Section with Delivery Baby Boy @ 0421    There were no vitals taken for this visit.  Visit Diagnosis:  Muscle pain, cervical - Plan: PT plan of care cert/re-cert      Subjective Assessment - 09/02/14 2114    Symptoms neck pain occurs prior to migraine; migraine occurred yesterday; pt. states headaches and migraines vary in intensity   Pertinent History Fibromyalgia; Campione disease; h/o migraines x 10 years with worsening in past 3 years; pt. saw Dr. Gaynell [facility] and has received Botox for the HA; last Botox injection May 2015   Patient Stated Goals decrease the pain   Pain Location --  pt. state pain is from neck to mid back region   Pain Onset More than a month ago          [facility] PT Assessment - 09/02/14 1546    Assessment   Medical Diagnosis cervicogenic headaches   Onset Date --  3 years ago   Next MD Visit Feb. 2016   AROM   Overall AROM  Within functional limits for tasks performed   Cervical Flexion 28   Cervical Extension  48   Cervical - Right Side Bend 32   Cervical - Left Side Bend 34   Cervical - Right Rotation 41   Cervical - Left Rotation 35   Palpation   Palpation tightness in L upper trap noted with palpation                            PT Short Term Goals - 09/02/14 2127    PT SHORT TERM GOAL #1   Title same as LTG's           PT Long Term Goals - 09/02/14 2127    PT LONG TERM GOAL #1   Title Pt. will report at least 25% improvement in cervical pain   Baseline 10-09-14   Time 4   Period Weeks   Status New   PT LONG TERM GOAL #2   Title Pt. will demo correct use and operation of TENS unit (if obtained for home use)   Baseline 10-09-14   Time 4   Period Weeks   Status New   PT LONG TERM GOAL #3   Title Increase cervical AROM to WFL's for all motions to incr. safety with driving with head turns.   Baseline 10-09-14  Time 4   Period Weeks   Status New   PT LONG TERM GOAL #4   Title Independent in HEP for cervical stretching and AROM   Baseline 10-09-14   Time 4   Period Weeks   Status New               Plan - 09/02/14 2122    Clinical Impression Statement pt. has cervical musc. tightness including upper and mid trapezius, resulting in neck pain associated with migraine headaches   Pt will benefit from skilled therapeutic intervention in order to improve on the following deficits Pain;Impaired flexibility;Increased muscle spasms;Decreased mobility   Rehab Potential Good   PT Frequency 2x / week   PT Duration 4 weeks   PT Treatment/Interventions ADLs/Self Care Home Management;Moist Heat;Therapeutic activities;Patient/family education;Traction;Therapeutic exercise;Passive range of motion;Ultrasound;Manual techniques;Electrical Stimulation;Neuromuscular re-education;Other (comment)  TENS unit    PT Next Visit Plan modalities for cervical musc. tightness/pain; trial of TENS, manual therapy, HEP for stretching/ROM   PT Home Exercise Plan see above    Consulted and Agree with Plan of Care Patient         Problem List Patient Active Problem List   Diagnosis Date Noted  . Headache 07/02/2014  . S/P cesarean section 04/20/2011  . Current pregnancy with history of pre-term labor 03/17/2011    Kary KosDilday, Carola Viramontes Suzanne, PT 09/02/2014, 9:34 PM  [facility] [address] [place], [place], [address] Phone: [phone]   Fax:  [phone]

## 2014-09-10 ENCOUNTER — Encounter: Payer: Self-pay | Admitting: Physical Therapy

## 2014-09-10 ENCOUNTER — Ambulatory Visit: Payer: 59 | Admitting: Physical Therapy

## 2014-09-10 DIAGNOSIS — M542 Cervicalgia: Secondary | ICD-10-CM

## 2014-09-10 NOTE — Patient Instructions (Addendum)
Flexibility: Upper Trapezius Stretch   Gently grasp right side of head while reaching behind back with other hand. Tilt head away until a gentle stretch is felt. Hold 15____ seconds. Repeat _3___ times each way. Do _2___ sessions per day.  http://orth.exer.us/340   Copyright  VHI. All rights reserved.  Upper Limb Neural Tension: Radial I   Place right arm across low back and turn head down toward other side. Gently increase stretch by pulling down on head and depressing shoulder girdle. Hold for 15 seconds. Repeat __3__ times each side.  Do _2___ sessions per day.  http://orth.exer.us/408   Copyright  VHI. All rights reserved.  Shoulder Circle Backward   Slowly circle shoulders backward. Relax. Can do this seated or standing. Repeat _10___ times. Do __2__ sessions per day.  http://gt2.exer.us/845   Copyright  VHI. All rights reserved.  Computer Work   Position work to Art gallery managerface forward. Use proper work and seat height. Keep shoulders back and down, wrists straight, and elbows at right angles. Use chair that provides full back support. Add footrest and lumbar roll as needed.   Copyright  VHI. All rights reserved.  Posture - Sitting   Sit upright, head facing forward. Try using a roll to support lower back. Keep shoulders relaxed, and avoid rounded back. Keep hips level with knees. Avoid crossing legs for long periods.   Copyright  VHI. All rights reserved.

## 2014-09-10 NOTE — Therapy (Signed)
[facility] [address] [place], [place], [address] Phone: [phone]   Fax:  [phone]  Physical Therapy Treatment  Patient Details  Name: Lisa Day MRN: [id number] Date of Birth: Dec 16, 1975 Referring Provider:  Karie Chimeraeese, Betti D, MD  Encounter Date: 09/10/2014      PT End of Session - 09/10/14 1535    Visit Number 2   Number of Visits 9   Date for PT Re-Evaluation 10/09/14   PT Start Time 1530   PT Stop Time 1615   PT Time Calculation (min) 45 min   Activity Tolerance Patient tolerated treatment well   Behavior During Therapy [facility] for tasks assessed/performed      Past Medical History  Diagnosis Date  . Grave's disease   . Asthma   . Fibromyalgia   . Headache 07/02/2014    Past Surgical History  Procedure Laterality Date  . Appendectomy  2001  . Cholecystectomy  2006  . Cesarean section  04/20/2011    Procedure: CESAREAN SECTION;  Surgeon: Jeani HawkingMichelle L Grewal, MD;  Location: [facility];  Service: Gynecology;  Laterality: N/A;  Primary Cesarean Section with Delivery Baby Boy @ 0421    There were no vitals taken for this visit.  Visit Diagnosis:  Muscle pain, cervical      Subjective Assessment - 09/10/14 1533    Currently in Pain? Yes   Pain Score 4    Pain Location Neck   Pain Orientation Upper;Posterior;Mid   Pain Descriptors / Indicators Tightness;Tender;Other (Comment)  stinging   Pain Type Chronic pain   Pain Radiating Towards down to scapular area   Pain Onset More than a month ago   Aggravating Factors  cold   Pain Relieving Factors heat     Treatment:  Manual therapy: - suboccipital release and manual cervical traction for decreased pain and decreased muscle tension - soft tissue mobs, trigger point release and positional release to bil cervical/thoracic muscles. Increased tightness on left > right. - passive stretching to upper traps, rhomboids and scalenes.  Exercise: -  posterior shoulder rolls x 10 reps - upper trap stretch 15 sec hold x 3 each side - neural stretch 15 sec hold x 3 each side - discussed computer set up and proper sitting posture. Issued handout with cues/demo/pictures.  Tens to bil cervicals/upper traps on back setting intensity to tolerance concurrent with moist hot pack x 10 minutes for decreased pain and muscle tension.        PT Education - 09/10/14 1602    Education provided Yes   Education Details HEP, sitting posture and computer set up/positioning   Person(s) Educated Patient;Spouse   Methods Explanation;Demonstration;Handout   Comprehension Verbalized understanding;Returned demonstration          PT Short Term Goals - 09/02/14 2127    PT SHORT TERM GOAL #1   Title same as LTG's           PT Long Term Goals - 09/02/14 2127    PT LONG TERM GOAL #1   Title Pt. will report at least 25% improvement in cervical pain   Baseline 10-09-14   Time 4   Period Weeks   Status New   PT LONG TERM GOAL #2   Title Pt. will demo correct use and operation of TENS unit (if obtained for home use)   Baseline 10-09-14   Time 4   Period Weeks   Status New   PT LONG TERM GOAL #3  Title Increase cervical AROM to WFL's for all motions to incr. safety with driving with head turns.   Baseline 10-09-14   Time 4   Period Weeks   Status New   PT LONG TERM GOAL #4   Title Independent in HEP for cervical stretching and AROM   Baseline 10-09-14   Time 4   Period Weeks   Status New           Plan - 09/10/14 1535    Clinical Impression Statement Reported decreased pain/tightness with manual therapy, especially cervical distraction. Trialed TENS with heat today for pain control as well, will assess next visit if pt noticed a difference. Educated pt on and issued HEP for stretching  and postural/position awareness for with work and sitting at a computer.                   Pt will benefit from skilled therapeutic intervention in order  to improve on the following deficits Pain;Impaired flexibility;Increased muscle spasms;Decreased mobility   Rehab Potential Good   PT Frequency 2x / week   PT Duration 4 weeks   PT Treatment/Interventions ADLs/Self Care Home Management;Moist Heat;Therapeutic activities;Patient/family education;Traction;Therapeutic exercise;Passive range of motion;Ultrasound;Manual techniques;Electrical Stimulation;Neuromuscular re-education;Other (comment)  TENS unit    PT Next Visit Plan Continue with modalities as needed, manual therapy and exercises for mobility and strengthening.   PT Home Exercise Plan see above   Consulted and Agree with Plan of Care Patient        Problem List Patient Active Problem List   Diagnosis Date Noted  . Headache 07/02/2014  . S/P cesarean section 04/20/2011  . Current pregnancy with history of pre-term labor 03/17/2011    Sallyanne Kuster 09/10/2014, 4:26 PM  Sallyanne Kuster, PTA, [facility] [address] [place], [place] [address] [phone] 09/10/2014, 4:26 PM

## 2014-09-17 ENCOUNTER — Ambulatory Visit: Payer: 59 | Admitting: Physical Therapy

## 2014-09-17 DIAGNOSIS — M542 Cervicalgia: Secondary | ICD-10-CM | POA: Diagnosis not present

## 2014-09-17 NOTE — Therapy (Signed)
[facility] [address] [place], [place], [address] Phone: [phone]   Fax:  [phone]  Physical Therapy Treatment  Patient Details  Name: Lisa Day MRN: [id number] Date of Birth: Oct 17, 1975 Referring Provider:  Karie Chimeraeese, Betti D, MD  Encounter Date: 09/17/2014      PT End of Session - 09/17/14 1535    Visit Number 3   Number of Visits 9   Date for PT Re-Evaluation 10/09/14   PT Start Time 1531   PT Stop Time 1614   PT Time Calculation (min) 43 min   Activity Tolerance Patient tolerated treatment well   Behavior During Therapy [facility] for tasks assessed/performed      Past Medical History  Diagnosis Date  . Grave's disease   . Asthma   . Fibromyalgia   . Headache 07/02/2014    Past Surgical History  Procedure Laterality Date  . Appendectomy  2001  . Cholecystectomy  2006  . Cesarean section  04/20/2011    Procedure: CESAREAN SECTION;  Surgeon: Jeani HawkingMichelle L Grewal, MD;  Location: [facility];  Service: Gynecology;  Laterality: N/A;  Primary Cesarean Section with Delivery Baby Boy @ 0421    There were no vitals taken for this visit.  Visit Diagnosis:  Muscle pain, cervical      Subjective Assessment - 09/17/14 1533    Symptoms Hurting real bad last few days and today.    Currently in Pain? Yes   Pain Score 8    Pain Location Neck   Pain Orientation Mid   Pain Descriptors / Indicators Tender;Squeezing  "pulling", "straining"   Pain Type Chronic pain   Pain Radiating Towards down to scapular area   Pain Onset More than a month ago   Pain Frequency Several days a week      Treatment: Manual Therapy - soft tissue mobs/trigger point release/positional release to cervicals, upper traps,rhomboids and subscapularis muscles.  - suboccipital release - manual cervical distraction - passive stretching of scalenes, rhomboids and upper traps both sides.   Mechanical cervical traction - EMPI unit 15-20  pounds, 3 minute static hold x 3 reps  Moist hot pack to bil cervicals/upper traps x 5 minutes        PT Short Term Goals - 09/02/14 2127    PT SHORT TERM GOAL #1   Title same as LTG's           PT Long Term Goals - 09/02/14 2127    PT LONG TERM GOAL #1   Title Pt. will report at least 25% improvement in cervical pain   Baseline 10-09-14   Time 4   Period Weeks   Status New   PT LONG TERM GOAL #2   Title Pt. will demo correct use and operation of TENS unit (if obtained for home use)   Baseline 10-09-14   Time 4   Period Weeks   Status New   PT LONG TERM GOAL #3   Title Increase cervical AROM to WFL's for all motions to incr. safety with driving with head turns.   Baseline 10-09-14   Time 4   Period Weeks   Status New   PT LONG TERM GOAL #4   Title Independent in HEP for cervical stretching and AROM   Baseline 10-09-14   Time 4   Period Weeks   Status New          Plan - 09/17/14 1535    Clinical Impression Statement Trialed mechanical cervical  traction today and continued with manual therapy and moist heat. Pt reported decreased pain after session to 4/10 (8/10 at start). Pt does not feel the tens helped pain, she is unsure. will contine  to assess use of tens.   Pt will benefit from skilled therapeutic intervention in order to improve on the following deficits Pain;Impaired flexibility;Increased muscle spasms;Decreased mobility   Rehab Potential Good   PT Frequency 2x / week   PT Duration 4 weeks   PT Treatment/Interventions ADLs/Self Care Home Management;Moist Heat;Therapeutic activities;Patient/family education;Traction;Therapeutic exercise;Passive range of motion;Ultrasound;Manual techniques;Electrical Stimulation;Neuromuscular re-education;Other (comment)  TENS unit    PT Next Visit Plan Continue with modalities as needed, manual therapy and exercises for mobility and strengthening. Assess if cervical traction helped pain.   PT Home Exercise Plan see above    Consulted and Agree with Plan of Care Patient        Problem List Patient Active Problem List   Diagnosis Date Noted  . Headache 07/02/2014  . S/P cesarean section 04/20/2011  . Current pregnancy with history of pre-term labor 03/17/2011    Sallyanne Kuster 09/17/2014, 4:16 PM   Sallyanne Kuster, PTA, [facility] [address] [place], [place] [address] [phone] 09/17/2014, 4:16 PM

## 2014-09-19 ENCOUNTER — Ambulatory Visit: Payer: 59 | Admitting: Physical Therapy

## 2014-09-19 ENCOUNTER — Encounter: Payer: Self-pay | Admitting: Physical Therapy

## 2014-09-19 DIAGNOSIS — M542 Cervicalgia: Secondary | ICD-10-CM

## 2014-09-19 NOTE — Therapy (Signed)
[facility] [address] [place], [place], [address] Phone: [phone]   Fax:  [phone]  Physical Therapy Treatment  Patient Details  Name: Lisa Day MRN: [id number] Date of Birth: [birth year] Referring Provider:  Karie Chimeraeese, Betti D, MD  Encounter Date: 09/19/2014      PT End of Session - 09/19/14 1407    Visit Number 4   Number of Visits 9   Date for PT Re-Evaluation 10/09/14   PT Start Time 1403   PT Stop Time 1448   PT Time Calculation (min) 45 min   Activity Tolerance Patient tolerated treatment well   Behavior [facility] for [facility] assessed/performed      Past Medical History  Diagnosis Date  . Grave's disease   . Asthma   . Fibromyalgia   . Headache 07/02/2014    Past Surgical History  Procedure Laterality Date  . Appendectomy  2001  . Cholecystectomy  2006  . Cesarean section  04/20/2011    Procedure: CESAREAN SECTION;  Surgeon: Jeani HawkingMichelle L Grewal, MD;  Location: [facility];  Service: Gynecology;  Laterality: N/A;  Primary Cesarean Section with Delivery Baby Boy @ 0421    There were no vitals taken for this visit.  Visit Diagnosis:  Muscle pain, cervical      Subjective Assessment - 09/19/14 1406    Symptoms Heat helped. The traction machine hurt her right shoulder, pulled her muscle too much. Had her meeting with the egronomics team at work and they should be fixing her workstation to be more ergonamically set soon.   Currently in Pain? Yes   Pain Score 7    Pain Location Neck   Pain Orientation Mid   Pain Descriptors / Indicators Aching;Tender   Pain Type Chronic pain   Pain Onset More than a month ago   Pain Frequency Several days a week   Aggravating Factors  cold   Pain Relieving Factors heat     Treatment:  Moist hot pack concurrent with IFC electrical stimulation intensity to tolerance x 10 minutes to bil upper traps and cervical muscles.  Exercise: - UBE level 1.0 x 2  minutes each fwd/bwd - with red pball: roll up with flexion stretch x 10, circles both ways x 10 and push ups x 10. With ball to back at wall:  Posterior shoulder rolls x 10, alternating UE raises x 10 each side, "W" and "v" x 10 each. - supine stretch with foam roll to spine and arms out in "T" position x 1.5 minutes        PT Education - 09/19/14 1445    Education provided Yes   Education Details foam roller stretch   Person(s) Educated Patient   Methods Explanation   Comprehension Verbalized understanding          PT Short Term Goals - 09/02/14 2127    PT SHORT TERM GOAL #1   Title same as LTG's           PT Long Term Goals - 09/02/14 2127    PT LONG TERM GOAL #1   Title Pt. will report at least 25% improvement in cervical pain   Baseline 10-09-14   Time 4   Period Weeks   Status New   PT LONG TERM GOAL #2   Title Pt. will demo correct use and operation of TENS unit (if obtained for home use)   Baseline 10-09-14   Time 4   Period Weeks  Status New   PT LONG TERM GOAL #3   Title Increase cervical AROM to WFL's for all motions to incr. safety with driving with head turns.   Baseline 10-09-14   Time 4   Period Weeks   Status New   PT LONG TERM GOAL #4   Title Independent in HEP for cervical stretching and AROM   Baseline 10-09-14   Time 4   Period Weeks   Status New           Plan - 09/19/14 1407    Clinical Impression Statement Tried new treatment plan today with heat/estim to decrease pain and then exercises for stability and strengthening. Pt reported pain as 4/10 after.   Pt will benefit from skilled therapeutic intervention in order to improve on the following deficits Pain;Impaired flexibility;Increased muscle spasms;Decreased mobility   Rehab Potential Good   PT Frequency 2x / week   PT Duration 4 weeks   PT Treatment/Interventions ADLs/Self Care Home Management;Moist Heat;Therapeutic activities;Patient/family education;Traction;Therapeutic  exercise;Passive range of motion;Ultrasound;Manual techniques;Electrical Stimulation;Neuromuscular re-education;Other (comment)  TENS unit    PT Next Visit Plan Continue with modalities as needed, manual therapy and exercises for mobility and strengthening. Assess if cervical traction helped pain.   PT Home Exercise Plan see above   Consulted and Agree with Plan of Care Patient      Problem List Patient Active Problem List   Diagnosis Date Noted  . Headache 07/02/2014  . S/P cesarean section 04/20/2011  . Current pregnancy with history of pre-term labor 03/17/2011    Sallyanne Kuster 09/19/2014, 4:38 PM  Sallyanne Kuster, PTA, [facility] [address] [place], [place] [address] [phone] 09/19/2014, 4:38 PM

## 2014-09-19 NOTE — Patient Instructions (Signed)
Pectoral Stretch, Supine on Foam Roller   Lie on back along length of 6 inch foam roller/rolled towel and allow arms to spread wide, can bend or straighten arms as tolerated.  Hold _60__ seconds to 3 minutes. Repeat _1-3__ times per session. Do _1-2__ sessions per day.  Copyright  VHI. All rights reserved.

## 2014-09-24 ENCOUNTER — Ambulatory Visit: Payer: 59 | Attending: Neurology | Admitting: Physical Therapy

## 2014-09-24 ENCOUNTER — Encounter: Payer: Self-pay | Admitting: Physical Therapy

## 2014-09-24 DIAGNOSIS — M542 Cervicalgia: Secondary | ICD-10-CM | POA: Insufficient documentation

## 2014-09-25 NOTE — Therapy (Addendum)
[facility] [address] [place], [place], [address] Phone: [phone]   Fax:  [phone]  Physical Therapy Treatment  Patient Details  Name: Lisa Day MRN: [id number] Date of Birth: 1976/03/16 Referring Provider:  Kristine Garbe, MD  Encounter Date: 09/24/2014      PT End of Session - 09/24/14 1542    Visit Number 5   Number of Visits 9   Date for PT Re-Evaluation 10/09/14   PT Start Time 5638   PT Stop Time 1612   PT Time Calculation (min) 39 min   Activity Tolerance Patient tolerated treatment well   Behavior During Therapy [facility] for tasks assessed/performed      Past Medical History  Diagnosis Date  . Grave's disease   . Asthma   . Fibromyalgia   . Headache 07/02/2014    Past Surgical History  Procedure Laterality Date  . Appendectomy  2001  . Cholecystectomy  2006  . Cesarean section  04/20/2011    Procedure: CESAREAN SECTION;  Surgeon: Cyril Mourning, MD;  Location: [facility];  Service: Gynecology;  Laterality: N/A;  Primary Cesarean Section with Delivery Baby Boy @ 0421    There were no vitals taken for this visit.  Visit Diagnosis:  Muscle pain, cervical      Subjective Assessment - 09/24/14 1538    Symptoms Felt good leaving after last session, however hurt "really bad" the next day. Hurting really bad today as well. Has been taking a muscle relaxer daily since saturday. Feels the towel pec stretch is the only home exercise that is helping.  Reports having spasms and tightness.   Currently in Pain? Yes   Pain Score 9    Pain Location Neck   Pain Orientation Posterior;Mid   Pain Descriptors / Indicators Throbbing;Tightness;Spasm;Aching;Tender   Pain Type Chronic pain   Pain Radiating Towards down to scapular area and up into her head, has had migraines past two days from tightness/spasms   Pain Onset More than a month ago   Pain Frequency Several days a week   Aggravating Factors   cold   Pain Relieving Factors heat      Treatment:  Moist hot pack to bil upper traps concurrent with manual therapy x 5-6 minutes and then removed for remainder of manual therapy.  Manual therapy: - suboccipital release, manual cervical distraction - soft tissue mobs and trigger point release to upper traps, rhomboids bil sides and subscapularis on left side only in both supine and prone positions. - passive stretching of scalenes, upper traps and rhomboids to both sides. - prone: muscle energy techniques performed for improved scapular stability and movement and for improved muscular balance/movement at spine via resisted shoulder movements with manual stability initially, progressing to resisted movements with gentle overpressure with proper muscular movement patterns into stretching.    Exercise: - seated posterior shoulder rolls x 10 reps; scapular retraction 5 sec's x 10 reps; upper trap stretch 20 sec's x 3 each side; neural stretch 20 sec's x 3 each side.      PT Short Term Goals - 09/02/14 2127    PT SHORT TERM GOAL #1   Title same as LTG's           PT Long Term Goals - 09/02/14 2127    PT LONG TERM GOAL #1   Title Pt. will report at least 25% improvement in cervical pain   Baseline 10-09-14   Time 4   Period Weeks  Status New   PT LONG TERM GOAL #2   Title Pt. will demo correct use and operation of TENS unit (if obtained for home use)   Baseline 10-09-14   Time 4   Period Weeks   Status New   PT LONG TERM GOAL #3   Title Increase cervical AROM to WFL's for all motions to incr. safety with driving with head turns.   Baseline 10-09-14   Time 4   Period Weeks   Status New   PT LONG TERM GOAL #4   Title Independent in HEP for cervical stretching and AROM   Baseline 10-09-14   Time 4   Period Weeks   Status New           Plan - 09/24/14 1542    Clinical Impression Statement Pt arriving with increased pain and headache today. Reported most relief with  cervical distraction and prone muscle energy techniques for scapular/spinal stability. Pain after session was decreased to 6/10 from 9/10. Pt being transfered to our [facility] location to trial mechanical cervical traction.                                                  Pt will benefit from skilled therapeutic intervention in order to improve on the following deficits Pain;Impaired flexibility;Increased muscle spasms;Decreased mobility   Rehab Potential Good   PT Frequency 2x / week   PT Duration 4 weeks   PT Treatment/Interventions ADLs/Self Care Home Management;Moist Heat;Therapeutic activities;Patient/family education;Traction;Therapeutic exercise;Passive range of motion;Ultrasound;Manual techniques;Electrical Stimulation;Neuromuscular re-education;Other (comment)  TENS unit    PT Next Visit Plan Continue with modalities as needed, manual therapy and exercises for mobility and strengthening. Transfer to [facility] location.   PT Home Exercise Plan see above   Consulted and Agree with Plan of Care Patient      Problem List Patient Active Problem List   Diagnosis Date Noted  . Headache 07/02/2014  . S/P cesarean section 04/20/2011  . Current pregnancy with history of pre-term labor 03/17/2011    Willow Ora 09/25/2014, 3:46 PM  Willow Ora, PTA, [facility] [address] [place], [place] [address] [phone] 09/25/2014, 3:47 PM    PHYSICAL THERAPY DISCHARGE SUMMARY  Visits from Start of Care: 5  Current functional level related to goals / functional outcomes: Some improvement noted with treatment   Remaining deficits: Continued pain and dysfunction    Education / Equipment: HEP  Plan: Patient agrees to discharge.  Patient goals were partially met. Patient is being discharged due to not returning since the last visit.  ?????    Celyn P. Helene Kelp PT, MPH 07/08/2015 11:42 AM

## 2014-09-26 ENCOUNTER — Ambulatory Visit: Payer: 59 | Admitting: Physical Therapy

## 2014-09-30 ENCOUNTER — Ambulatory Visit: Payer: 59 | Admitting: Physical Therapy

## 2014-09-30 DIAGNOSIS — M542 Cervicalgia: Secondary | ICD-10-CM | POA: Diagnosis not present

## 2014-10-01 ENCOUNTER — Ambulatory Visit: Payer: 59 | Admitting: Physical Therapy

## 2014-10-02 ENCOUNTER — Encounter: Payer: Self-pay | Admitting: Adult Health

## 2014-10-02 ENCOUNTER — Ambulatory Visit (INDEPENDENT_AMBULATORY_CARE_PROVIDER_SITE_OTHER): Payer: 59 | Admitting: Adult Health

## 2014-10-02 ENCOUNTER — Ambulatory Visit: Payer: 59 | Admitting: Physical Therapy

## 2014-10-02 VITALS — BP 133/91 | HR 70 | Ht 63.0 in | Wt 170.0 lb

## 2014-10-02 DIAGNOSIS — R51 Headache: Secondary | ICD-10-CM

## 2014-10-02 DIAGNOSIS — G4486 Cervicogenic headache: Secondary | ICD-10-CM

## 2014-10-02 MED ORDER — TIZANIDINE HCL 2 MG PO TABS: 2.0000 mg | ORAL_TABLET | Freq: Two times a day (BID) | ORAL | Status: DC

## 2014-10-02 NOTE — Progress Notes (Signed)
PATIENT: Lisa Day DOB: 1975/08/30  REASON FOR VISIT: follow up HISTORY FROM: patient  HISTORY OF PRESENT ILLNESS: Ms. Lisa Day is a 39 year old right-handed black female with a history of headaches since her high school years. She was referred to neuromuscular therapy. She has been going, she has two visit left but does not feel it was beneficial. She continues to have headaches that start in the neck and travel to the back of the head. + photophobia and phonophobia. + Nausea and vomiting.  She will have some dizziness when the headache comes on. She has been taking flexeril without benefit. She was given Maxalt and she has noticed benefit for 2 hours then the migraine will return.  HISTORY 07/02/14 (Lisa Day): Ms. Lisa Day is a 39 year old right-handed black female with a history of headaches since her high school years. The patient indicates that over the last 2 years, the headaches have become more frequent. The patient is virtually having daily headaches and the headache quality has changed. [facility], and she has undergone treatment with Botox that was beneficial, but seemed to wear off before the next injection. The patient was given a trial on Topamax, but she could not tolerate this drug. Another medication was tried, but she could not remember the name of the medication. She indicates that her current headaches are better in the morning, worse as the day goes on. She has neck discomfort, left greater than right, and the pain spreads up into the back of the head and around with a tight pressure feeling. The patient may have some photophobia and phonophobia with the headache and some nausea without vomiting. She will have some blurring of vision, occasionally she will have spots in front eyes. She indicates that bright lights may bother her, but there are no other significant activating factors for her headache. She recently has been  placed on gabapentin within the last one month, currently on 600 mg 3 times daily with good benefit. As long she stays on the medication, her headaches are not severe. The patient has gained benefit from Excedrin Migraine in the past, but this is no longer effective. She takes Flexeril for her headache, without much benefit. She is sent to this office for further evaluation. She does report some discomfort down both arms, some numbness in the arms and hands and a sensation of weakness in the legs. She has some mild gait instability, and some urinary urgency at times. She denies any falls.  REVIEW OF SYSTEMS: Out of a complete 14 system review of symptoms, the patient complains only of the following symptoms, and all other reviewed systems are negative.  Appetite change, excessive sweating, abdominal pain, nausea, restless leg, frequent waking, daytime sleepiness, snoring, sleep talking, acting out dreams, food allergies, joint pain, back pain, aching muscles, neck pain, neck stiffness, memory loss, dizziness, headache, bruise/bleed easily, memory loss, dizziness, headache  ALLERGIES: Allergies  Allergen Reactions  . Shrimp [Shellfish Allergy] Hives, Shortness Of Breath and Swelling  . Watermelon Concentrate Hives, Shortness Of Breath and Swelling  . Latex Rash    HOME MEDICATIONS: Outpatient Prescriptions Prior to Visit  Medication Sig Dispense Refill  . albuterol (PROVENTIL HFA;VENTOLIN HFA) 108 (90 BASE) MCG/ACT inhaler Inhale 2 puffs into the lungs every 6 (six) hours as needed for wheezing.    . cyclobenzaprine (FLEXERIL) 10 MG tablet Take 10 mg by mouth 3 (three) times daily.  1  . EPINEPHrine (EPI-PEN) 0.3 mg/0.3  mL SOAJ injection Inject 0.3 mg into the muscle once.    Marland Kitchen FLUoxetine (PROZAC) 40 MG capsule Take 40 mg by mouth daily.    . fluticasone (FLONASE) 50 MCG/ACT nasal spray Place 2 sprays into the nose daily.    Marland Kitchen gabapentin (NEURONTIN) 600 MG tablet Take 600 mg by mouth 3 (three)  times daily.    Marland Kitchen levocetirizine (XYZAL) 5 MG tablet Take 5 mg by mouth every evening.    Marland Kitchen levonorgestrel (MIRENA) 20 MCG/24HR IUD 1 each by Intrauterine route once.    Marland Kitchen levothyroxine (SYNTHROID, LEVOTHROID) 100 MCG tablet Take 100 mcg by mouth daily.  3  . montelukast (SINGULAIR) 10 MG tablet Take 10 mg by mouth at bedtime.    Marland Kitchen QVAR 80 MCG/ACT inhaler 2 (two) times daily.  5  . RABEprazole (ACIPHEX) 20 MG tablet Take 20 mg by mouth daily.    . rizatriptan (MAXALT) 10 MG tablet Take 1 tablet (10 mg total) by mouth 3 (three) times daily as needed for migraine. May repeat in 2 hours if needed 10 tablet 3   No facility-administered medications prior to visit.    PAST MEDICAL HISTORY: Past Medical History  Diagnosis Date  . Grave's disease   . Asthma   . Fibromyalgia   . Headache 07/02/2014    PAST SURGICAL HISTORY: Past Surgical History  Procedure Laterality Date  . Appendectomy  2001  . Cholecystectomy  2006  . Cesarean section  04/20/2011    Procedure: CESAREAN SECTION;  Surgeon: Jeani Hawking, MD;  Location: [facility];  Service: Gynecology;  Laterality: N/A;  Primary Cesarean Section with Delivery Baby Boy @ 0421    FAMILY HISTORY: Family History  Problem Relation Age of Onset  . Hypertension Mother   . Breast cancer Maternal Grandmother   . Migraines Brother   . Migraines Sister     SOCIAL HISTORY: History   Social History  . Marital Status: Single    Spouse Name: N/A  . Number of Children: 2  . Years of Education: some colle   Occupational History  .  [employer]   Social History Main Topics  . Smoking status: Never Smoker   . Smokeless tobacco: Never Used  . Alcohol Use: No  . Drug Use: No  . Sexual Activity: Yes   Other Topics Concern  . Not on file   Social History Narrative      PHYSICAL EXAM  Filed Vitals:   10/02/14 1500  BP: 133/91  Pulse: 70  Height:  (1.6 m)  Weight: 170 lb (77.111 kg)   Body mass index is 30.12  kg/(m^2).  Generalized: Well developed, in no acute distress   Neurological examination  Mentation: Alert oriented to time, place, history taking. Follows all commands speech and language fluent Cranial nerve II-XII: Pupils were equal round reactive to light. Extraocular movements were full, visual field were full on confrontational test. Facial sensation and strength were normal. Uvula tongue midline. Head turning and shoulder shrug  were normal and symmetric. Motor: The motor testing reveals 5 over 5 strength of all 4 extremities. Good symmetric motor tone is noted throughout.  Sensory: Sensory testing is intact to soft touch on all 4 extremities. No evidence of extinction is noted.  Coordination: Cerebellar testing reveals good finger-nose-finger and heel-to-shin bilaterally.  Gait and station: Gait is normal. Tandem gait is normal. Romberg is negative. No drift is seen.  Reflexes: Deep tendon reflexes are symmetric and normal bilaterally.  DIAGNOSTIC DATA (LABS, IMAGING, TESTING) - I reviewed patient records, labs, notes, testing and imaging myself where available.   ASSESSMENT AND PLAN 39 y.o. year old female  has a past medical history of Grave's disease; Asthma; Fibromyalgia; and Headache (07/02/2014). here with:  1. Cervicogenic headache  Patient has not had any improvement in headaches. Her headaches start as if they are cervicogenic and then develops into a full migraine. She will stop the flexeril. I will start the patient on tizanidine 2 mg BID. I will also check MRI of the cervical spine for any acute changes that could be causing the headaches. She will F/U in 3 months are sooner if needed.   Butch Penny, MSN, NP-C 10/02/2014, 3:15 PM [facility] [address], [address] [place], [place] [address] [phone]  Note: This document was prepared with digital dictation and possible smart phrase technology. Any transcriptional errors that result from this  process are unintentional.

## 2014-10-02 NOTE — Progress Notes (Signed)
I have read the note, and I agree with the clinical assessment and plan.  Pamalee Marcoe KEITH   

## 2014-10-02 NOTE — Patient Instructions (Signed)
Stop Flexeril Start Tizanidine 2 mg twice a day. Dose may have to be increased to get maximum benefit.    Tizanidine tablets or capsules What is this medicine? TIZANIDINE (tye ZAN i deen) helps to relieve muscle spasms. It may be used to help in the treatment of multiple sclerosis and spinal cord injury. This medicine may be used for other purposes; ask your health care provider or pharmacist if you have questions. COMMON BRAND NAME(S): Zanaflex What should I tell my health care provider before I take this medicine? They need to know if you have any of these conditions: -kidney disease -liver disease -low blood pressure -mental disorder -an unusual or allergic reaction to tizanidine, other medicines, lactose (tablets only), foods, dyes, or preservatives -pregnant or trying to get pregnant -breast-feeding How should I use this medicine? Take this medicine by mouth with a full glass of water. Take this medicine on an empty stomach, at least 30 minutes before or 2 hours after food. Do not take with food unless you talk with your doctor. Follow the directions on the prescription label. Take your medicine at regular intervals. Do not take your medicine more often than directed. Do not stop taking except on your doctor's advice. Suddenly stopping the medicine can be very dangerous. Talk to your pediatrician regarding the use of this medicine in children. Patients over 37 years old may have a stronger reaction and need a smaller dose. Overdosage: If you think you have taken too much of this medicine contact a poison control center or emergency room at once. NOTE: This medicine is only for you. Do not share this medicine with others. What if I miss a dose? If you miss a dose, take it as soon as you can. If it is almost time for your next dose, take only that dose. Do not take double or extra doses. What may interact with this medicine? Do not take this medicine with any of the following  medications: -ciprofloxacin -clonidine -fluvoxamine -guanabenz -guanfacine -methyldopa This medicine may also interact with the following medications: -acyclovir -alcohol -antihistamines -baclofen -barbiturates like phenobarbital -benzodiazepines -cimetidine -famotidine -female hormones, like estrogens or progestins and birth control pills -medicines for high blood pressure -medicines for irregular heartbeat -medicines for pain like codeine, morphine, and hydrocodone -medicines for sleep -rofecoxib -some antibiotics like levofloxacin, ofloxacin -ticlopidine -zileuton This list may not describe all possible interactions. Give your health care provider a list of all the medicines, herbs, non-prescription drugs, or dietary supplements you use. Also tell them if you smoke, drink alcohol, or use illegal drugs. Some items may interact with your medicine. What should I watch for while using this medicine? You may get drowsy or dizzy. Do not drive, use machinery, or do anything that needs mental alertness until you know how this medicine affects you. Do not stand or sit up quickly, especially if you are an older patient. This reduces the risk of dizzy or fainting spells. Alcohol may interfere with the effect of this medicine. Avoid alcoholic drinks. Your mouth may get dry. Chewing sugarless gum or sucking hard candy, and drinking plenty of water may help. Contact your doctor if the problem does not go away or is severe. What side effects may I notice from receiving this medicine? Side effects that you should report to your doctor or health care professional as soon as possible: -allergic reactions like skin rash, itching or hives, swelling of the face, lips, or tongue -blurred vision -fainting spells -hallucinations -nausea or vomiting -nervousness -  redness, blistering, peeling or loosening of the skin, including inside the mouth -slow or irregular heartbeat, palpitations, or chest  pain -yellowing of the skin or eyes Side effects that usually do not require medical attention (report to your doctor or health care professional if they continue or are bothersome): -dizziness -drowsiness -dry mouth -tiredness or weakness This list may not describe all possible side effects. Call your doctor for medical advice about side effects. You may report side effects to FDA at 1-800-FDA-1088. Where should I keep my medicine? Keep out of the reach of children. Store at room temperature between 15 and 30 degrees C (59 and 86 degrees F). Throw away any unused medicine after the expiration date. NOTE: This sheet is a summary. It may not cover all possible information. If you have questions about this medicine, talk to your doctor, pharmacist, or health care provider.  2015, Elsevier/Gold Standard. (2008-04-24 12:38:02)

## 2014-10-03 ENCOUNTER — Ambulatory Visit: Payer: 59 | Admitting: Physical Therapy

## 2014-10-06 ENCOUNTER — Other Ambulatory Visit: Payer: 59

## 2014-10-06 ENCOUNTER — Ambulatory Visit: Payer: 59 | Admitting: Physical Therapy

## 2014-10-08 ENCOUNTER — Ambulatory Visit: Payer: 59 | Admitting: Physical Therapy

## 2014-10-20 ENCOUNTER — Ambulatory Visit
Admission: RE | Admit: 2014-10-20 | Discharge: 2014-10-20 | Disposition: A | Discharge status: Home / Self Care | Payer: 59 | Source: Ambulatory Visit | Attending: Adult Health | Admitting: Adult Health

## 2014-10-20 DIAGNOSIS — R51 Headache: Secondary | ICD-10-CM | POA: Diagnosis not present

## 2014-10-20 DIAGNOSIS — G4486 Cervicogenic headache: Secondary | ICD-10-CM

## 2014-10-20 MED ORDER — GADOBENATE DIMEGLUMINE 529 MG/ML IV SOLN
15.0000 mL | Freq: Once | INTRAVENOUS | Status: AC | PRN
  Administered 2014-10-20: 15 mL via INTRAVENOUS

## 2014-10-23 ENCOUNTER — Telehealth: Payer: Self-pay | Admitting: Adult Health

## 2014-10-23 NOTE — Telephone Encounter (Signed)
I called patient. I reviewed the MRI results of the cervical spine. MRI shows some mild degenerative changes. Patient states that she is doing okay. I informed her that if her symptoms worsen she should let us know.

## 2015-01-08 ENCOUNTER — Other Ambulatory Visit: Payer: Self-pay | Admitting: Obstetrics and Gynecology

## 2015-01-08 DIAGNOSIS — N6320 Unspecified lump in the left breast, unspecified quadrant: Secondary | ICD-10-CM

## 2015-01-12 ENCOUNTER — Ambulatory Visit
Admission: RE | Admit: 2015-01-12 | Discharge: 2015-01-12 | Disposition: A | Discharge status: Home / Self Care | Payer: 59 | Source: Ambulatory Visit | Attending: Obstetrics and Gynecology | Admitting: Obstetrics and Gynecology

## 2015-01-12 DIAGNOSIS — N6320 Unspecified lump in the left breast, unspecified quadrant: Secondary | ICD-10-CM

## 2015-01-14 ENCOUNTER — Ambulatory Visit
Admission: RE | Admit: 2015-01-14 | Discharge: 2015-01-14 | Disposition: A | Discharge status: Home / Self Care | Payer: 59 | Source: Ambulatory Visit | Attending: Family Medicine | Admitting: Family Medicine

## 2015-01-14 ENCOUNTER — Other Ambulatory Visit: Payer: Self-pay | Admitting: Family Medicine

## 2015-01-14 DIAGNOSIS — M25551 Pain in right hip: Secondary | ICD-10-CM

## 2015-01-18 ENCOUNTER — Emergency Department (HOSPITAL_COMMUNITY): Payer: 59

## 2015-01-18 ENCOUNTER — Emergency Department (HOSPITAL_COMMUNITY)
Admission: EM | Admit: 2015-01-18 | Discharge: 2015-01-18 | Disposition: A | Discharge status: Home Health | Payer: 59 | Attending: Emergency Medicine | Admitting: Emergency Medicine

## 2015-01-18 ENCOUNTER — Encounter (HOSPITAL_COMMUNITY): Payer: Self-pay | Admitting: *Deleted

## 2015-01-18 DIAGNOSIS — Z91040 Latex allergy status: Secondary | ICD-10-CM | POA: Insufficient documentation

## 2015-01-18 DIAGNOSIS — Z8639 Personal history of other endocrine, nutritional and metabolic disease: Secondary | ICD-10-CM | POA: Insufficient documentation

## 2015-01-18 DIAGNOSIS — Z7951 Long term (current) use of inhaled steroids: Secondary | ICD-10-CM | POA: Insufficient documentation

## 2015-01-18 DIAGNOSIS — Z79899 Other long term (current) drug therapy: Secondary | ICD-10-CM | POA: Insufficient documentation

## 2015-01-18 DIAGNOSIS — M25551 Pain in right hip: Secondary | ICD-10-CM | POA: Insufficient documentation

## 2015-01-18 DIAGNOSIS — J45909 Unspecified asthma, uncomplicated: Secondary | ICD-10-CM | POA: Diagnosis not present

## 2015-01-18 DIAGNOSIS — M549 Dorsalgia, unspecified: Secondary | ICD-10-CM

## 2015-01-18 DIAGNOSIS — Z3202 Encounter for pregnancy test, result negative: Secondary | ICD-10-CM | POA: Diagnosis not present

## 2015-01-18 DIAGNOSIS — M545 Low back pain: Secondary | ICD-10-CM | POA: Insufficient documentation

## 2015-01-18 LAB — URINALYSIS, ROUTINE W REFLEX MICROSCOPIC
Bilirubin Urine: NEGATIVE
Glucose, UA: NEGATIVE mg/dL
Hgb urine dipstick: NEGATIVE
KETONES UR: NEGATIVE mg/dL
NITRITE: NEGATIVE
PROTEIN: NEGATIVE mg/dL
SPECIFIC GRAVITY, URINE: 1.025 (ref 1.005–1.030)
Urobilinogen, UA: 0.2 mg/dL (ref 0.0–1.0)
pH: 7 (ref 5.0–8.0)

## 2015-01-18 LAB — URINE MICROSCOPIC-ADD ON

## 2015-01-18 LAB — POC URINE PREG, ED: Preg Test, Ur: NEGATIVE

## 2015-01-18 MED ORDER — HYDROCODONE-ACETAMINOPHEN 5-325 MG PO TABS: 1.0000 | ORAL_TABLET | Freq: Four times a day (QID) | ORAL | Status: DC | PRN

## 2015-01-18 NOTE — ED Notes (Signed)
Pt is here with right lower back and right hip pain that radiates down leg.  Pt states it has been hurting for a while.  Pt had xrays and were negative and started on prednisone, but pain is increasing.

## 2015-01-18 NOTE — ED Provider Notes (Addendum)
CSN: [id number]     Arrival date & time 01/18/15  1147 History   First MD Initiated Contact with Patient 01/18/15 1202     Chief Complaint  Patient presents with  . Back Pain  . Leg Pain     (Consider location/radiation/quality/duration/timing/severity/associated sxs/prior Treatment) The history is provided by the patient. No language interpreter was used.  Lisa Day is a 39 year old female with past medical history of Augenstein' disease, asthma, fibromyalgia, headaches presenting to the ED with right hip pain has been ongoing for approximately 2 months. Patient reported that the right hip pain is gone progressively worse over 1 week described as a shooting, stabbing, stiffening pain that radiates to her right lower back and right leg all the way down to her ankle. Patient reports that the pain increased with standing, walking, sitting for long periods of time. Patient was seen and assessed by her primary care provider on Tuesday, 01/12/2015, where an x-ray of her right hip was performed with negative findings and patient was started on prednisone. Patient reported that she has been taking the medication as prescribed without relief. Patient reported that yesterday she noted increased weakness to her right leg, reported that was intermittent. Denied previous injury, fall, loss of sensation, urinary and bowel incontinence, abdominal pain, nausea, vomiting, fever, chills, IV drug abuse. PCP Dr. Pecola Leisureeese  Past Medical History  Diagnosis Date  . Grave's disease   . Asthma   . Fibromyalgia   . Headache 07/02/2014   Past Surgical History  Procedure Laterality Date  . Appendectomy  2001  . Cholecystectomy  2006  . Cesarean section  04/20/2011    Procedure: CESAREAN SECTION;  Surgeon: Jeani HawkingMichelle L Grewal, MD;  Location: [facility];  Service: Gynecology;  Laterality: N/A;  Primary Cesarean Section with Delivery Baby Boy @ 0421   Family History  Problem Relation Age of Onset  . Hypertension Mother     . Breast cancer Maternal Grandmother   . Migraines Brother   . Migraines Sister    History  Substance Use Topics  . Smoking status: Never Smoker   . Smokeless tobacco: Never Used  . Alcohol Use: No   OB History    Gravida Para Term Preterm AB TAB SAB Ectopic Multiple Living   2 2 1 1  0 0 0 0 0 2     Review of Systems  Constitutional: Negative for fever and chills.  Respiratory: Negative for shortness of breath.   Cardiovascular: Negative for chest pain.  Gastrointestinal: Negative for abdominal pain.  Musculoskeletal: Positive for back pain and arthralgias (Right hip pain).  Neurological: Negative for weakness and numbness.      Allergies  Shrimp; Watermelon concentrate; and Latex  Home Medications   Prior to Admission medications   Medication Sig Start Date End Date Taking? Authorizing Provider  albuterol (PROVENTIL HFA;VENTOLIN HFA) 108 (90 BASE) MCG/ACT inhaler Inhale 2 puffs into the lungs every 6 (six) hours as needed for wheezing.    Historical Provider, MD  EPINEPHrine (EPI-PEN) 0.3 mg/0.3 mL SOAJ injection Inject 0.3 mg into the muscle once.    Historical Provider, MD  FLUoxetine (PROZAC) 40 MG capsule Take 40 mg by mouth daily. 07/01/14   Historical Provider, MD  fluticasone (FLONASE) 50 MCG/ACT nasal spray Place 2 sprays into the nose daily.    Historical Provider, MD  gabapentin (NEURONTIN) 600 MG tablet Take 600 mg by mouth 3 (three) times daily. 06/25/14   Historical Provider, MD  HYDROcodone-acetaminophen (NORCO/VICODIN) 5-325 MG per  tablet Take 1 tablet by mouth every 6 (six) hours as needed for severe pain. 01/18/15   Micheal Sheen, PA-C  levocetirizine (XYZAL) 5 MG tablet Take 5 mg by mouth every evening.    Historical Provider, MD  levonorgestrel (MIRENA) 20 MCG/24HR IUD 1 each by Intrauterine route once.    Historical Provider, MD  levothyroxine (SYNTHROID, LEVOTHROID) 100 MCG tablet Take 100 mcg by mouth daily. 05/07/14   Historical Provider, MD   montelukast (SINGULAIR) 10 MG tablet Take 10 mg by mouth at bedtime.    Historical Provider, MD  QVAR 80 MCG/ACT inhaler 2 (two) times daily. 05/15/14   Historical Provider, MD  RABEprazole (ACIPHEX) 20 MG tablet Take 20 mg by mouth daily. 06/19/14   Historical Provider, MD  rizatriptan (MAXALT) 10 MG tablet Take 1 tablet (10 mg total) by mouth 3 (three) times daily as needed for migraine. May repeat in 2 hours if needed 07/02/14   York Spaniel, MD  tiZANidine (ZANAFLEX) 2 MG tablet Take 1 tablet (2 mg total) by mouth 2 (two) times daily. 10/02/14   Butch Penny, NP   BP 117/85 mmHg  Pulse 79  Temp(Src) 98.1 F (36.7 C) (Oral)  Resp 20  SpO2 100% Physical Exam  Constitutional: She is oriented to person, place, and time. She appears well-developed and well-nourished. No distress.  HENT:  Head: Normocephalic and atraumatic.  Eyes: Conjunctivae and EOM are normal. Right eye exhibits no discharge. Left eye exhibits no discharge.  Neck: Normal range of motion. Neck supple.  Cardiovascular: Normal rate, regular rhythm and normal heart sounds.  Exam reveals no friction rub.   No murmur heard. Pulses:      Radial pulses are 2+ on the right side, and 2+ on the left side.       Dorsalis pedis pulses are 2+ on the right side, and 2+ on the left side.  Pulmonary/Chest: Effort normal and breath sounds normal. No respiratory distress. She has no wheezes. She has no rales.  Abdominal: Soft. Bowel sounds are normal. She exhibits no distension. There is no tenderness. There is no rebound and no guarding.  Musculoskeletal: She exhibits tenderness. She exhibits no edema.       Lumbar back: She exhibits tenderness. She exhibits normal range of motion, no bony tenderness, no swelling, no edema, no deformity, no laceration and no pain.       Back:       Right upper leg: She exhibits tenderness. She exhibits no bony tenderness, no swelling, no edema and no deformity.       Right lower leg: She exhibits  tenderness. She exhibits no bony tenderness, no swelling, no edema and no deformity.       Legs: Patient is able to flex and extend the right hip, but pain is noted on physical examination. Full range of motion to the right knee, ankle and digits of the right foot. Negative swelling, erythema, inflammation, lesions, sores, deformities identified. Negative red streaks or signs of cellulitic infection. Negative palpable cord.  Neurological: She is alert and oriented to person, place, and time. No cranial nerve deficit. She exhibits normal muscle tone. Coordination normal. GCS eye subscore is 4. GCS verbal subscore is 5. GCS motor subscore is 6.  Reflex Scores:      Patellar reflexes are 2+ on the right side and 2+ on the left side. Cranial nerves III-XII grossly intact Strength 5+/5+ to upper and lower extremities bilaterally with resistance applied, equal distribution noted Negative saddle  paresthesias Sensation intact with differentiation sharp and dull touch  Skin: Skin is dry. No rash noted. She is not diaphoretic. No erythema.  Psychiatric: She has a normal mood and affect. Her behavior is normal. Thought content normal.  Nursing note and vitals reviewed.   ED Course  Procedures (including critical care time)   Study Result     CLINICAL DATA: Progressively worsening right hip pain over the past 3-4 months. No known injury.  EXAM: RIGHT HIP (WITH PELVIS) 2-3 VIEWS  COMPARISON: None.  FINDINGS: No evidence of acute fracture or dislocation. Well preserved joint space. Well preserved bone mineral density. No intrinsic osseous abnormality.  Included AP pelvis demonstrates a normal-appearing contralateral left hip. Sacroiliac joints and symphysis pubis intact. Visualized lower lumbar spine unremarkable.  Intrauterine device noted in the midline of the pelvis. Right labial ring noted.  IMPRESSION: Normal examination.   Electronically Signed  By: Hulan Saas  M.D.  On: 01/14/2015 13:37     Results for orders placed or performed during the hospital encounter of 01/18/15  Urinalysis, Routine w reflex microscopic (not at [facility])  Result Value Ref Range   Color, Urine YELLOW YELLOW   APPearance CLOUDY (A) CLEAR   Specific Gravity, Urine 1.025 1.005 - 1.030   pH 7.0 5.0 - 8.0   Glucose, UA NEGATIVE NEGATIVE mg/dL   Hgb urine dipstick NEGATIVE NEGATIVE   Bilirubin Urine NEGATIVE NEGATIVE   Ketones, ur NEGATIVE NEGATIVE mg/dL   Protein, ur NEGATIVE NEGATIVE mg/dL   Urobilinogen, UA 0.2 0.0 - 1.0 mg/dL   Nitrite NEGATIVE NEGATIVE   Leukocytes, UA TRACE (A) NEGATIVE  Urine microscopic-add on  Result Value Ref Range   Squamous Epithelial / LPF MANY (A) RARE   WBC, UA 3-6 <3 WBC/hpf   Bacteria, UA FEW (A) RARE  POC urine preg, ED (not at [facility])  Result Value Ref Range   Preg Test, Ur NEGATIVE NEGATIVE    Labs Review Labs Reviewed  URINALYSIS, ROUTINE W REFLEX MICROSCOPIC (NOT AT [facility]) - Abnormal; Notable for the following:    APPearance CLOUDY (*)    Leukocytes, UA TRACE (*)    All other components within normal limits  URINE MICROSCOPIC-ADD ON - Abnormal; Notable for the following:    Squamous Epithelial / LPF MANY (*)    Bacteria, UA FEW (*)    All other components within normal limits  POC URINE PREG, ED    Imaging Review Dg Lumbar Spine Complete  01/18/2015   CLINICAL DATA:  Back pain for 2 months  EXAM: LUMBAR SPINE - COMPLETE 4+ VIEW  COMPARISON:  09/21/2011  FINDINGS: There is no evidence of lumbar spine fracture. Alignment is normal. Intervertebral disc spaces are maintained.  IMPRESSION: Negative.   Electronically Signed   By: Natasha Mead M.D.   On: 01/18/2015 14:46   Ct Hip Right Wo Contrast  01/18/2015   CLINICAL DATA:  RIGHT HIP PAIN X SEVERAL MONTHS, NO INJURY, PAIN LEVEL HAS INCREASED OVER THE PAST 2 WEEKS MAKING IT DIFFICULT TO WALK  EXAM: CT OF THE RIGHT HIP WITHOUT CONTRAST  TECHNIQUE: Multidetector CT imaging of the  right hip was performed according to the standard protocol. Multiplanar CT image reconstructions were also generated.  COMPARISON:  Right hip radiographs, 01/14/2015  FINDINGS: No fracture. No bone lesion. Hip joint is normally spaced and aligned. There is minor irregularity and sclerosis along the lateral margin of the superior acetabulum. Minimal spurring is noted from the base of the femoral head.  There  is ossification along the transverse acetabular ligament. This is an unusual anatomic variation. It reportedly can be associated with hip pain. There is a single small area of cystic change along the inferior humeral head near the ossified transverse ligament. No other bone lesion.  There is no joint effusion.  Surrounding muscular structures are normal in attenuation. No evidence of a tendon this abnormality.  Within the visualized pelvis, there is no mass or adenopathy. No abnormal fluid collection. No inflammatory change. Partly imaged IUD lies within a retroverted years.  IMPRESSION: 1. No fracture.  No bone lesion. 2. Minor degenerative change with mild irregularity along the superior lateral acetabular margin and minor osteophyte formation along the base of the femoral head. Glenoid labrum integrity cannot be assessed on CT, and is best assessed with hip MRI, particularly hip MR arthrography. 3. 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 This is potentially the source of pain.   Electronically Signed   By: Amie Portland M.D.   On: 01/18/2015 14:17     EKG Interpretation None      1:27 PM This provider discussed great detail with attending physician, Dr. Trudie Buckler. Recommended CT of right hip without contrast be performed.  3:45 PM Imaging reviewed in great detail with attending physician, Dr. Trudie Buckler. Recommended patient be discharged home with pain medications and for patient to follow-up as an outpatient  with orthopedics.  MDM   Final diagnoses:  Back pain  Right hip pain    Medications - No data to display  Filed Vitals:   01/18/15 1152 01/18/15 1508 01/18/15 1612  BP: 129/78 122/84 117/85  Pulse: 105 76 79  Temp: 98.2 F (36.8 C) 98.5 F (36.9 C) 98.1 F (36.7 C)  TempSrc: Oral Oral Oral  Resp: 18 18 20   SpO2: 100% 100% 100%   Urine pregnancy negative. Urinalysis noted negative for hemoglobin, nitrites trace leukocytes identified with squamous cells and white blood cell count 3-6. Plain film of right hip with unilateral pelvis performed on 01/14/2015 unremarkable. Plain film of lumbar spine negative. CT head without contrast no fracture or bone lesion noted. Moderate degenerative change with mild irregularity along the superior lateral acetabular margin and minor osteophyte formation on the base of the femoral head. Ossification of the transverse acetabular ligament with a small adjacent focus of cystic change in the inferior femoral head, this potentially source of pain. Findings of degenerative joint disease noted on CT, arthritis. Ossification of the transverse acetabular ligament noted on CT, this can be resulting in patient's pain. Negative findings of occult fracture. Negative findings of UTI or pyelonephritis. Doubt septic joint. Doubt cauda equina. Doubt epidural abscess. Negative focal neurological deficits. Pulses palpable and strong. Sensation intact. Gait proper-negative step-offs or sway. Imaging reviewed in great detail with attending physician, Dr. Clarice Pole, who agreed to plan of discharge and for patient to follow-up as an outpatient. Patient stable, afebrile. Patient not septic appearing. Negative signs of respiratory distress. Discharged patient. Discharged patient with pain medications - discussed course, precautions, disposal technique. Referred patient to PCP and orthopedics. Discussed with patient to rest and avoid any physical or strenuous activity. Discussed with  patient to closely monitor symptoms and if symptoms are to worsen or change to report back to the ED - strict return instructions given.  Patient agreed to plan of care, understood, all questions answered.   Raymon Mutton, PA-C 01/18/15 1625  Arby Barrette, MD 01/18/15  [address], PA-C 01/18/15 1633  Arby Barrette, MD 01/19/15 [id number]

## 2015-01-18 NOTE — ED Notes (Signed)
Patient transported to X-ray 

## 2015-01-18 NOTE — Discharge Instructions (Signed)
Please call your doctor for a followup appointment within 24-48 hours. When you talk to your doctor please let them know that you were seen in the emergency department and have them acquire all of your records so that they can discuss the findings with you and formulate a treatment plan to fully care for your new and ongoing problems. Please follow-up with her primary care provider Please follow-up with orthopedics Please rest and stay hydrated Please avoid any physical or strenuous activity Please take medications as prescribed - while on pain medications there is to be no drinking alcohol, driving, operating any heavy machinery. If extra please dispose in a proper manner. Please do not take any extra Tylenol with this medication for this can lead to Tylenol overdose and liver issues.  Please continue to monitor symptoms closely and if symptoms are to worsen or change (fever greater than 101, chills, sweating, nausea, vomiting, chest pain, shortness of breathe, difficulty breathing, weakness, numbness, tingling, worsening or changes to pain pattern, fall, injury, loss of sensation, inability to control urine or bowel movements, swelling to the leg, red streaks running down the leg) please report back to the Emergency Department immediately.   Hip Pain Your hip is the joint between your upper legs and your lower pelvis. The bones, cartilage, tendons, and muscles of your hip joint perform a lot of work each day supporting your body weight and allowing you to move around. Hip pain can range from a minor ache to severe pain in one or both of your hips. Pain may be felt on the inside of the hip joint near the groin, or the outside near the buttocks and upper thigh. You may have swelling or stiffness as well.  HOME CARE INSTRUCTIONS   Take medicines only as directed by your health care provider.  Apply ice to the injured area:  Put ice in a plastic bag.  Place a towel between your skin and the  bag.  Leave the ice on for 15-20 minutes at a time, 3-4 times a day.  Keep your leg raised (elevated) when possible to lessen swelling.  Avoid activities that cause pain.  Follow specific exercises as directed by your health care provider.  Sleep with a pillow between your legs on your most comfortable side.  Record how often you have hip pain, the location of the pain, and what it feels like. SEEK MEDICAL CARE IF:   You are unable to put weight on your leg.  Your hip is red or swollen or very tender to touch.  Your pain or swelling continues or worsens after 1 week.  You have increasing difficulty walking.  You have a fever. SEEK IMMEDIATE MEDICAL CARE IF:   You have fallen.  You have a sudden increase in pain and swelling in your hip. MAKE SURE YOU:   Understand these instructions.  Will watch your condition.  Will get help right away if you are not doing well or get worse. Document Released: 01/26/2010 Document Revised: 12/23/2013 Document Reviewed: 04/04/2013 [facility] Pa[facility] Patient Information 2015 [place], [place]. This information is not intended to replace advice given to you by your health care provider. Make sure you discuss any questions you have with your health care provider.

## 2015-01-18 NOTE — ED Notes (Signed)
Returned from Rad 

## 2015-01-18 NOTE — ED Notes (Signed)
Pt ambulated to the bathroom with ease 

## 2015-01-23 ENCOUNTER — Other Ambulatory Visit: Payer: Self-pay | Admitting: Orthopedic Surgery

## 2015-01-23 DIAGNOSIS — M25551 Pain in right hip: Secondary | ICD-10-CM

## 2015-01-23 DIAGNOSIS — M545 Low back pain, unspecified: Secondary | ICD-10-CM

## 2015-02-08 ENCOUNTER — Ambulatory Visit
Admission: RE | Admit: 2015-02-08 | Discharge: 2015-02-08 | Disposition: A | Discharge status: Home / Self Care | Payer: 59 | Source: Ambulatory Visit | Attending: Orthopedic Surgery | Admitting: Orthopedic Surgery

## 2015-02-08 DIAGNOSIS — M545 Low back pain, unspecified: Secondary | ICD-10-CM

## 2015-02-08 DIAGNOSIS — M25551 Pain in right hip: Secondary | ICD-10-CM

## 2015-02-26 ENCOUNTER — Encounter: Payer: Self-pay | Admitting: Adult Health

## 2015-02-26 ENCOUNTER — Ambulatory Visit (INDEPENDENT_AMBULATORY_CARE_PROVIDER_SITE_OTHER): Payer: 59 | Admitting: Adult Health

## 2015-02-26 VITALS — BP 117/83 | HR 81 | Ht 63.0 in | Wt 168.0 lb

## 2015-02-26 DIAGNOSIS — R51 Headache: Secondary | ICD-10-CM | POA: Diagnosis not present

## 2015-02-26 DIAGNOSIS — G4486 Cervicogenic headache: Secondary | ICD-10-CM

## 2015-02-26 MED ORDER — TIZANIDINE HCL 2 MG PO TABS: 2.0000 mg | ORAL_TABLET | Freq: Two times a day (BID) | ORAL | Status: DC

## 2015-02-26 NOTE — Progress Notes (Signed)
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 She returns today for follow-up. At the last visit she was started on tizanidine 2 mg twice a day. She reports this has been beneficial. She states that she now only has approximately one headache a week. She states the headache still originate from the neck up to the back of the head. Positive for photophobia and phonophobia. Denies nausea and vomiting. The patient also cut her hair and she thinks that has helped as well. Denies any new neurological symptoms. She returns today for follow-up.  HISTORY 10/02/14: Lisa Day is a 39 year old right-handed black female with a history of headaches since her high school years. She was referred to neuromuscular therapy. She has been going, she has two visit left but does not feel it was beneficial. She continues to have headaches that start in the neck and travel to the back of the head. + photophobia and phonophobia. + Nausea and vomiting. She will have some dizziness when the headache comes on. She has been taking flexeril without benefit. She was given Maxalt and she has noticed benefit for 2 hours then the migraine will return.   REVIEW OF SYSTEMS: Out of a complete 14 system review of symptoms, the patient complains only of the following symptoms, and all other reviewed systems are negative.  Eye itching, excessive sweating, shortness of breath, food allergies, bruise/bleed easily, memory loss, dizziness, headache, joint pain, back pain, aching muscles, muscle cramps, neck pain, neck stiffness, restless leg, daytime sleepiness, snoring, sleep talking, acting out dreams, decreased concentration, depression, nervous/anxious  ALLERGIES: Allergies  Allergen Reactions  . Shrimp [Shellfish Allergy] Hives, Shortness Of Breath  and Swelling  . Watermelon Concentrate Hives, Shortness Of Breath and Swelling  . Latex Rash    HOME MEDICATIONS: Outpatient Prescriptions Prior to Visit  Medication Sig Dispense Refill  . albuterol (PROVENTIL HFA;VENTOLIN HFA) 108 (90 BASE) MCG/ACT inhaler Inhale 2 puffs into the lungs every 6 (six) hours as needed for wheezing.    Marland Kitchen. EPINEPHrine (EPI-PEN) 0.3 mg/0.3 mL SOAJ injection Inject 0.3 mg into the muscle once.    Marland Kitchen. FLUoxetine (PROZAC) 40 MG capsule Take 40 mg by mouth daily.    . fluticasone (FLONASE) 50 MCG/ACT nasal spray Place 2 sprays into the nose daily.    Marland Kitchen. gabapentin (NEURONTIN) 600 MG tablet Take 600 mg by mouth 3 (three) times daily.    Marland Kitchen. HYDROcodone-acetaminophen (NORCO/VICODIN) 5-325 MG per tablet Take 1 tablet by mouth every 6 (six) hours as needed for severe pain. 5 tablet 0  . levocetirizine (XYZAL) 5 MG tablet Take 5 mg by mouth every evening.    Marland Kitchen. levonorgestrel (MIRENA) 20 MCG/24HR IUD 1 each by Intrauterine route once.    Marland Kitchen. levothyroxine (SYNTHROID, LEVOTHROID) 100 MCG tablet Take 100 mcg by mouth daily.  3  . montelukast (SINGULAIR) 10 MG tablet Take 10 mg by mouth at bedtime.    Marland Kitchen. QVAR 80 MCG/ACT inhaler 2 (two) times daily.  5  . RABEprazole (ACIPHEX) 20 MG tablet Take 20 mg by mouth daily.    . rizatriptan (MAXALT) 10 MG tablet Take 1 tablet (10 mg total) by mouth 3 (three) times daily as needed for migraine. May repeat in 2 hours if needed 10 tablet 3  . tiZANidine (ZANAFLEX) 2 MG tablet Take 1 tablet (2  mg total) by mouth 2 (two) times daily. 60 tablet 3   No facility-administered medications prior to visit.    PAST MEDICAL HISTORY: Past Medical History  Diagnosis Date  . Grave's disease   . Asthma   . Fibromyalgia   . Headache 07/02/2014    PAST SURGICAL HISTORY: Past Surgical History  Procedure Laterality Date  . Appendectomy  2001  . Cholecystectomy  2006  . Cesarean section  04/20/2011    Procedure: CESAREAN SECTION;  Surgeon: Jeani Hawking, MD;  Location: [facility];  Service: Gynecology;  Laterality: N/A;  Primary Cesarean Section with Delivery Baby Boy @ 0421    FAMILY HISTORY: Family History  Problem Relation Age of Onset  . Hypertension Mother   . Breast cancer Maternal Grandmother   . Migraines Brother   . Migraines Sister     SOCIAL HISTORY: History   Social History  . Marital Status: Single    Spouse Name: N/A  . Number of Children: 2  . Years of Education: some colle   Occupational History  .  [employer]   Social History Main Topics  . Smoking status: Never Smoker   . Smokeless tobacco: Never Used  . Alcohol Use: No  . Drug Use: No  . Sexual Activity: Yes   Other Topics Concern  . Not on file   Social History Narrative      PHYSICAL EXAM  Filed Vitals:   02/26/15 1533  BP: 117/83  Pulse: 81  Height:  (1.6 m)  Weight: 168 lb (76.204 kg)   Body mass index is 29.77 kg/(m^2).  Generalized: Well developed, in no acute distress   Neurological examination  Mentation: Alert oriented to time, place, history taking. Follows all commands speech and language fluent Cranial nerve II-XII: Pupils were equal round reactive to light. Extraocular movements were full, visual field were full on confrontational test. Facial sensation and strength were normal. Uvula tongue midline. Head turning and shoulder shrug  were normal and symmetric. Motor: The motor testing reveals 5 over 5 strength of all 4 extremities. Good symmetric motor tone is noted throughout.  Sensory: Sensory testing is intact to soft touch on all 4 extremities. No evidence of extinction is noted.  Coordination: Cerebellar testing reveals good finger-nose-finger and heel-to-shin bilaterally.  Gait and station: Gait is normal. Tandem gait is normal. Romberg is negative. No drift is seen.  Reflexes: Deep tendon reflexes are symmetric and normal bilaterally.   DIAGNOSTIC DATA (LABS, IMAGING, TESTING) - I reviewed patient  records, labs, notes, testing and imaging myself where available.     ASSESSMENT AND PLAN 39 y.o. year old female  has a past medical history of Grave's disease; Asthma; Fibromyalgia; and Headache (07/02/2014). here with:  1. Cervicogenic headaches  Overall the patient is doing well. She will continue taking tizanidine 2 mg twice a day. Patient advised if her symptoms worsen or she develops new symptoms she should let us know. Otherwise she will follow-up in 6 months or sooner if needed.  Butch Penny, MSN, NP-C 02/26/2015, 3:42 PM [facility] [address], [address] [place], [place] [address] [phone]  Note: This document was prepared with digital dictation and possible smart phrase technology. Any transcriptional errors that result from this process are unintentional.

## 2015-02-26 NOTE — Patient Instructions (Signed)
Continue Tizanidine

## 2015-03-17 NOTE — Progress Notes (Signed)
I reviewed note and agree with plan.   VIKRAM R. PENUMALLI, MD  Certified in Neurology, Neurophysiology and Neuroimaging  [facility] [address], [address] [place], [place] [address] [phone]   

## 2015-05-01 DIAGNOSIS — M25551 Pain in right hip: Secondary | ICD-10-CM | POA: Insufficient documentation

## 2015-08-18 ENCOUNTER — Other Ambulatory Visit: Payer: Self-pay | Admitting: Allergy

## 2015-08-18 MED ORDER — MONTELUKAST SODIUM 10 MG PO TABS: 10.0000 mg | ORAL_TABLET | Freq: Every day | ORAL | Status: DC

## 2015-08-18 MED ORDER — LEVOCETIRIZINE DIHYDROCHLORIDE 5 MG PO TABS: 5.0000 mg | ORAL_TABLET | Freq: Every evening | ORAL | Status: DC

## 2015-09-29 ENCOUNTER — Other Ambulatory Visit: Payer: Self-pay | Admitting: Obstetrics and Gynecology

## 2015-09-29 DIAGNOSIS — R928 Other abnormal and inconclusive findings on diagnostic imaging of breast: Secondary | ICD-10-CM

## 2015-10-05 ENCOUNTER — Ambulatory Visit
Admission: RE | Admit: 2015-10-05 | Discharge: 2015-10-05 | Disposition: A | Discharge status: Home / Self Care | Payer: 59 | Source: Ambulatory Visit | Attending: Obstetrics and Gynecology | Admitting: Obstetrics and Gynecology

## 2015-10-05 DIAGNOSIS — R928 Other abnormal and inconclusive findings on diagnostic imaging of breast: Secondary | ICD-10-CM

## 2015-12-10 ENCOUNTER — Encounter (HOSPITAL_COMMUNITY): Payer: Self-pay

## 2015-12-10 ENCOUNTER — Emergency Department (HOSPITAL_COMMUNITY)
Admission: EM | Admit: 2015-12-10 | Discharge: 2015-12-10 | Disposition: A | Discharge status: Home / Self Care | Payer: 59 | Attending: Emergency Medicine | Admitting: Emergency Medicine

## 2015-12-10 DIAGNOSIS — Z79891 Long term (current) use of opiate analgesic: Secondary | ICD-10-CM | POA: Diagnosis not present

## 2015-12-10 DIAGNOSIS — M546 Pain in thoracic spine: Secondary | ICD-10-CM | POA: Diagnosis not present

## 2015-12-10 DIAGNOSIS — J45909 Unspecified asthma, uncomplicated: Secondary | ICD-10-CM | POA: Diagnosis not present

## 2015-12-10 DIAGNOSIS — Z7951 Long term (current) use of inhaled steroids: Secondary | ICD-10-CM | POA: Insufficient documentation

## 2015-12-10 DIAGNOSIS — Z791 Long term (current) use of non-steroidal anti-inflammatories (NSAID): Secondary | ICD-10-CM | POA: Diagnosis not present

## 2015-12-10 DIAGNOSIS — M545 Low back pain: Secondary | ICD-10-CM | POA: Insufficient documentation

## 2015-12-10 DIAGNOSIS — Z91040 Latex allergy status: Secondary | ICD-10-CM | POA: Insufficient documentation

## 2015-12-10 DIAGNOSIS — E0500 Thyrotoxicosis with diffuse goiter without thyrotoxic crisis or storm: Secondary | ICD-10-CM | POA: Diagnosis not present

## 2015-12-10 DIAGNOSIS — M549 Dorsalgia, unspecified: Secondary | ICD-10-CM

## 2015-12-10 DIAGNOSIS — M542 Cervicalgia: Secondary | ICD-10-CM | POA: Diagnosis not present

## 2015-12-10 MED ORDER — IBUPROFEN 800 MG PO TABS: 800.0000 mg | ORAL_TABLET | Freq: Three times a day (TID) | ORAL | Status: DC

## 2015-12-10 NOTE — ED Provider Notes (Signed)
History  By signing my name below, I, Lisa Day, attest that this documentation has been prepared under the direction and in the presence of Lisa Helper, PA-C. Electronically Signed: Karle Day, ED Scribe. 12/10/2015. 4:31 PM.  Chief Complaint  Patient presents with  . Neck Pain  . Back Pain   The history is provided by the patient and medical records. No language interpreter was used.    HPI Comments:  Lisa Day is a 40 y.o. female with PMHx of Camus' disease and fibromyalgia who presents to the Emergency Department complaining of neck and back pain that has been present for the past 6 weeks. Pt reports the pain began worsening 3-4 days ago. Pt states the pain is different than her normal fibromyalgia pain. She states the pain starts at the base of her head and radiates down to her bilateral hips. She reports associated numbness of the 2nd throught 5th fingers bilaterally. She has been taking Ibuprofen and Flexeril with minimal relief of the pain. She reports being on Gabapentin in the past but was taken off of it by her PCP, Dr. Pecola Leisure, about 6 months ago because it was not working. Sitting and standing increase the pain. Certain movements seem to prolong the pain. She denies alleviating factors. She denies rash, hemoptysis, bowel or bladder incontinence, saddle anesthesia, fever, chills, bruising, wounds, swelling, numbness, tingling or weakness of the lower extremities. She denies trauma, injury or fall. She states her last PCP visit was 6 weeks ago and was given an injection and a 10 day course of medication but pt cannot recall what it was. She called Dr. Pecola Leisure today but there were no available appts.  Past Medical History  Diagnosis Date  . Grave's disease   . Asthma   . Fibromyalgia   . Headache 07/02/2014   Past Surgical History  Procedure Laterality Date  . Appendectomy  2001  . Cholecystectomy  2006  . Cesarean section  04/20/2011    Procedure: CESAREAN  SECTION;  Surgeon: Jeani Hawking, MD;  Location: [facility];  Service: Gynecology;  Laterality: N/A;  Primary Cesarean Section with Delivery Baby Boy @ [phone]  . Hip arthroscopy     Family History  Problem Relation Age of Onset  . Hypertension Mother   . Breast cancer Maternal Grandmother   . Migraines Brother   . Migraines Sister    Social History  Substance Use Topics  . Smoking status: Never Smoker   . Smokeless tobacco: Never Used  . Alcohol Use: No   OB History    Gravida Para Term Preterm AB TAB SAB Ectopic Multiple Living   0 0 0 0 0 2     Review of Systems  Constitutional: Negative for fever and chills.  Respiratory: Negative for cough.   Gastrointestinal: Negative for nausea and vomiting.  Genitourinary:       No bowel or bladder incontinence  Musculoskeletal: Positive for back pain and neck pain.  Skin: Negative for color change, rash and wound.  Neurological: Positive for numbness. Negative for weakness.    Allergies  Shrimp; Watermelon concentrate; and Latex  Home Medications   Prior to Admission medications   Medication Sig Start Date End Date Taking? Authorizing Provider  cyclobenzaprine (FLEXERIL) 10 MG tablet Take 10 mg by mouth 3 (three) times daily as needed for muscle spasms.   Yes Historical Provider, MD  fluticasone (FLONASE) 50 MCG/ACT nasal spray Place 2 sprays into the nose at bedtime.  Yes Historical Provider, MD  levocetirizine (XYZAL) 5 MG tablet Take 1 tablet (5 mg total) by mouth every evening. 08/18/15  Yes Fletcher Anon, MD  levothyroxine (SYNTHROID, LEVOTHROID) 125 MCG tablet Take 125 mcg by mouth daily before breakfast.   Yes Historical Provider, MD  montelukast (SINGULAIR) 10 MG tablet Take 1 tablet (10 mg total) by mouth at bedtime. 08/18/15  Yes Jose Posey Rea, MD  QVAR 80 MCG/ACT inhaler Inhale 1 puff into the lungs 2 (two) times daily.  05/15/14  Yes Historical Provider, MD  albuterol (PROVENTIL HFA;VENTOLIN HFA) 108 (90  BASE) MCG/ACT inhaler Inhale 2 puffs into the lungs every 6 (six) hours as needed for wheezing.    Historical Provider, MD  EPINEPHrine (EPI-PEN) 0.3 mg/0.3 mL SOAJ injection Inject 0.3 mg into the muscle once.    Historical Provider, MD  HYDROcodone-acetaminophen (NORCO/VICODIN) 5-325 MG per tablet Take 1 tablet by mouth every 6 (six) hours as needed for severe pain. Patient not taking: Reported on 12/10/2015 01/18/15   Marissa Sciacca, PA-C  ibuprofen (ADVIL,MOTRIN) 800 MG tablet Take 1 tablet (800 mg total) by mouth 3 (three) times daily. 12/10/15   Lisa Helper, PA-C  levonorgestrel (MIRENA) 20 MCG/24HR IUD 1 each by Intrauterine route once.    Historical Provider, MD  rizatriptan (MAXALT) 10 MG tablet Take 1 tablet (10 mg total) by mouth 3 (three) times daily as needed for migraine. May repeat in 2 hours if needed Patient not taking: Reported on 12/10/2015 07/02/14   York Spaniel, MD  tiZANidine (ZANAFLEX) 2 MG tablet Take 1 tablet (2 mg total) by mouth 2 (two) times daily. Patient not taking: Reported on 12/10/2015 02/26/15   Butch Penny, NP   Triage Vitals: BP 115/75 mmHg  Pulse 89  Temp(Src) 98 F (36.7 C) (Oral)  Resp 16  Ht  (1.6 m)  Wt 153 lb (69.4 kg)  BMI 27.11 kg/m2  SpO2 100% Physical Exam  Constitutional: She is oriented to person, place, and time. She appears well-developed and well-nourished.  HENT:  Head: Normocephalic and atraumatic.  Eyes: EOM are normal.  Neck: Normal range of motion.  Cardiovascular: Normal rate, regular rhythm and normal heart sounds.  Exam reveals no gallop and no friction rub.   No murmur heard. Pulmonary/Chest: Effort normal and breath sounds normal. No respiratory distress. She has no wheezes. She has no rales.  Musculoskeletal: Normal range of motion.  C, T and L paraspinal tenderness bilaterally. No crepitus or step off. No overlying skin changes.  Neurological: She is alert and oriented to person, place, and time.  Subjective antalgic  gait  Skin: Skin is warm and dry. No rash noted. No erythema.  Psychiatric: She has a normal mood and affect. Her behavior is normal.  Nursing note and vitals reviewed.   ED Course  Procedures (including critical care time) DIAGNOSTIC STUDIES: Oxygen Saturation is 100% on RA, normal by my interpretation.   COORDINATION OF CARE: 4:29 PM- vague back pain x 6 weeks. No evidence of infection.  Doubt cardiopulmonary etiology.  No red flags. Hx of fibromyalgias. Advised pt to follow up with PCP and will prescribe pain medication and increase Motrin dose. Pt verbalizes understanding and agrees to plan.    MDM   Final diagnoses:  Bilateral back pain, unspecified location    BP 115/75 mmHg  Pulse 89  Temp(Src) 98 F (36.7 C) (Oral)  Resp 16  Ht  (1.6 m)  Wt 69.4 kg  BMI 27.11 kg/m2  SpO2 100%   I personally performed the services described in this documentation, which was scribed in my presence. The recorded information has been reviewed and is accurate.       Lisa HelperBowie Elesha Thedford, PA-C 12/10/15 1641  Lyndal Pulleyaniel Knott, MD 12/11/15 [phone]

## 2015-12-10 NOTE — ED Notes (Signed)
Patient states she has had intermittent neck pain and back pain x 6 weeks. Patien tstates when she sits up she has more pain in the posterior neck. Patient also c/o numbness to the fingers bilaterally.

## 2015-12-10 NOTE — Discharge Instructions (Signed)

## 2016-03-22 ENCOUNTER — Other Ambulatory Visit: Payer: Self-pay | Admitting: Obstetrics and Gynecology

## 2016-03-22 DIAGNOSIS — R921 Mammographic calcification found on diagnostic imaging of breast: Secondary | ICD-10-CM

## 2016-03-31 ENCOUNTER — Ambulatory Visit
Admission: RE | Admit: 2016-03-31 | Discharge: 2016-03-31 | Disposition: A | Discharge status: Home / Self Care | Payer: 59 | Source: Ambulatory Visit | Attending: Obstetrics and Gynecology | Admitting: Obstetrics and Gynecology

## 2016-03-31 DIAGNOSIS — R921 Mammographic calcification found on diagnostic imaging of breast: Secondary | ICD-10-CM

## 2016-05-03 ENCOUNTER — Other Ambulatory Visit: Payer: Self-pay | Admitting: Family Medicine

## 2016-05-03 DIAGNOSIS — E0500 Thyrotoxicosis with diffuse goiter without thyrotoxic crisis or storm: Secondary | ICD-10-CM

## 2016-05-09 IMAGING — DX DG LUMBAR SPINE COMPLETE 4+V
5 series · 5 of 5 positions shown · non-contrast
Comparison: 09/21/2011

CLINICAL DATA: Back pain for 2 months

EXAM:
LUMBAR SPINE - COMPLETE 4+ VIEW

[l-spine ap]
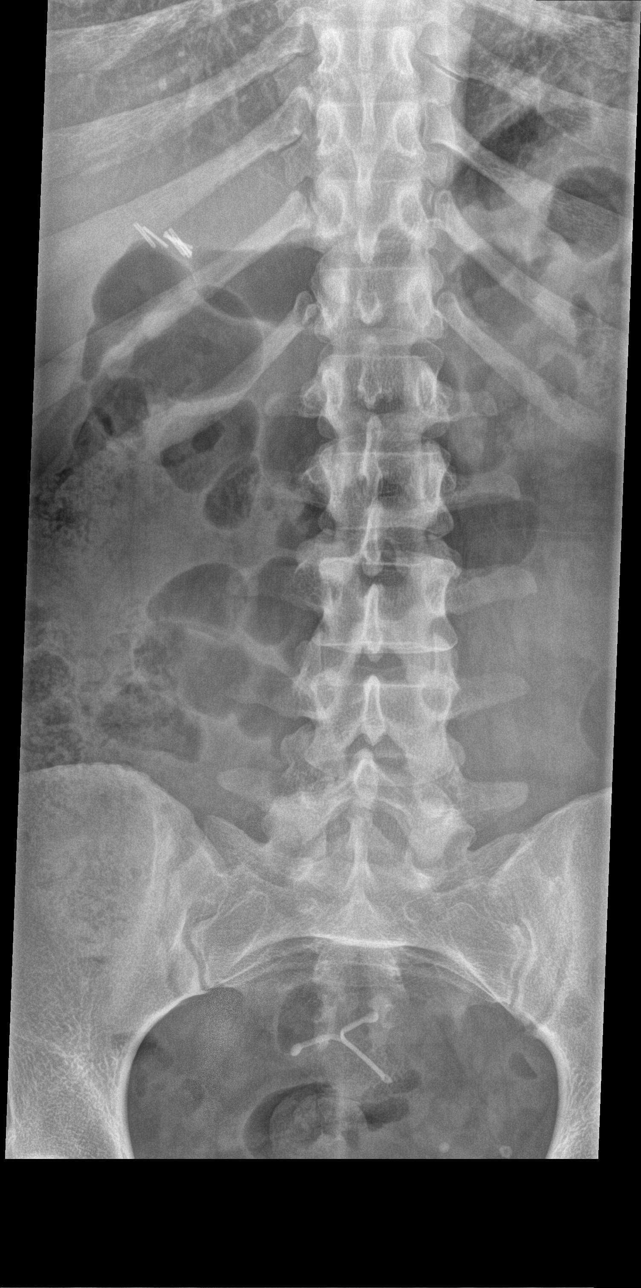

[l-spine obl (1 of 2)]
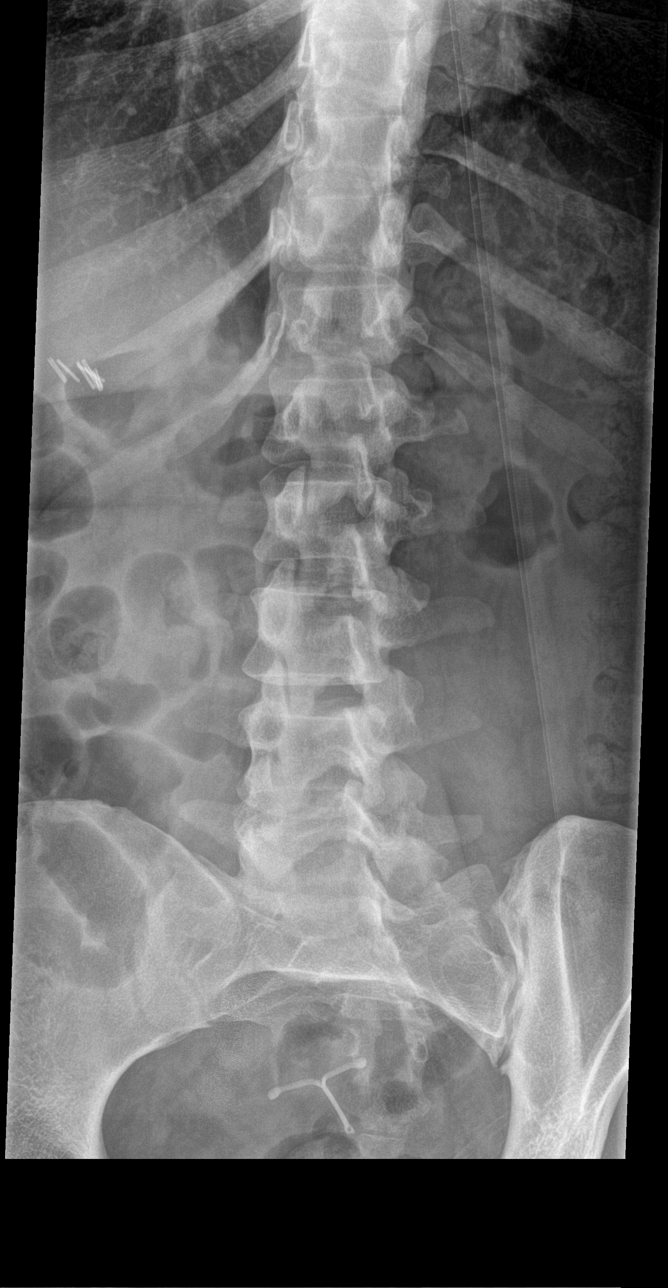

[l-spine obl (2 of 2)]
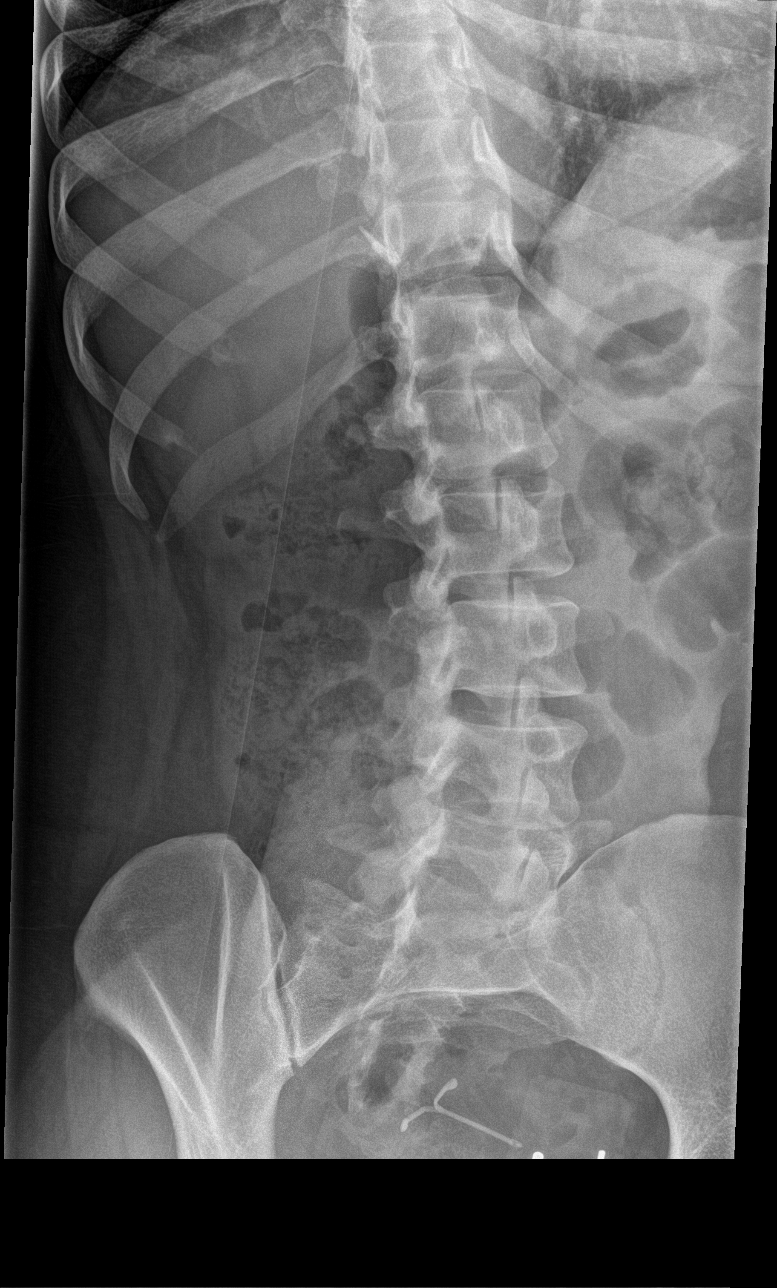

[l-spine lat]
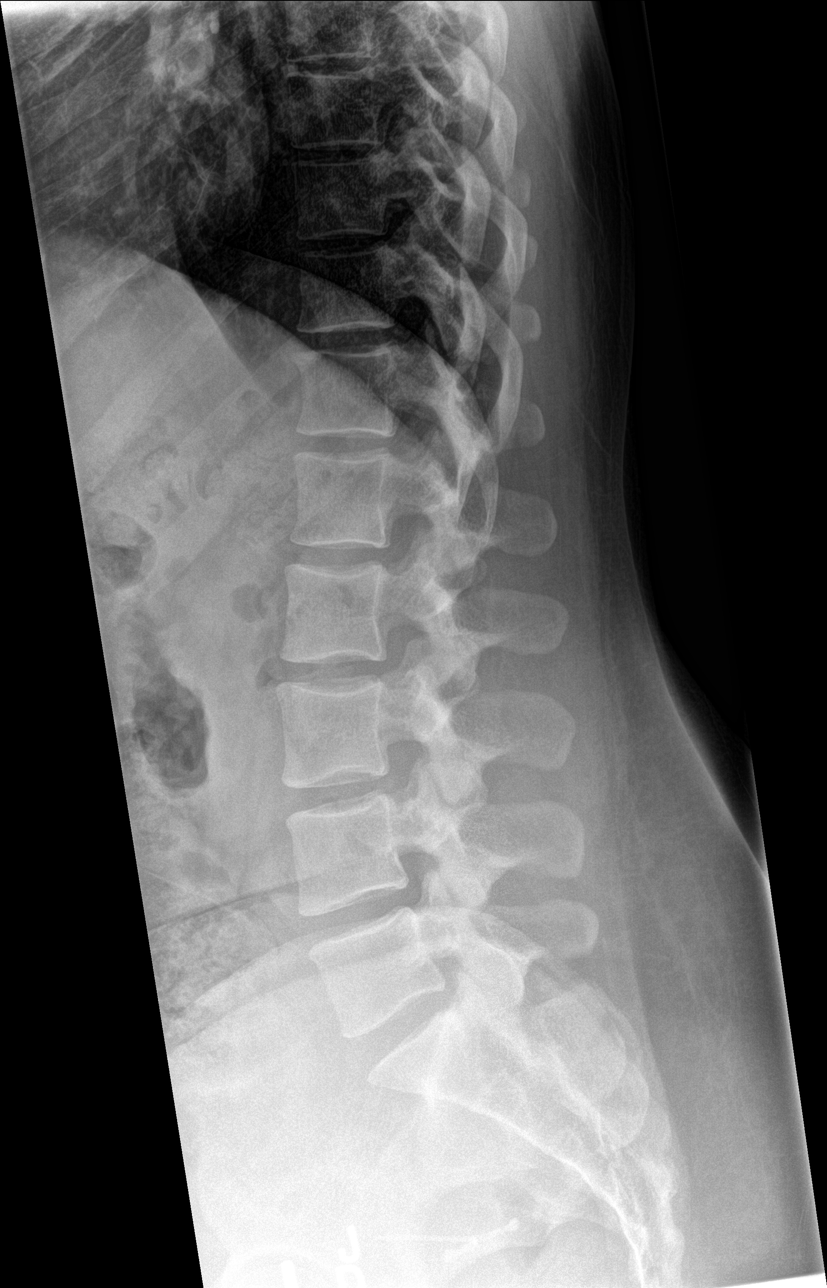

[l-spine spot]
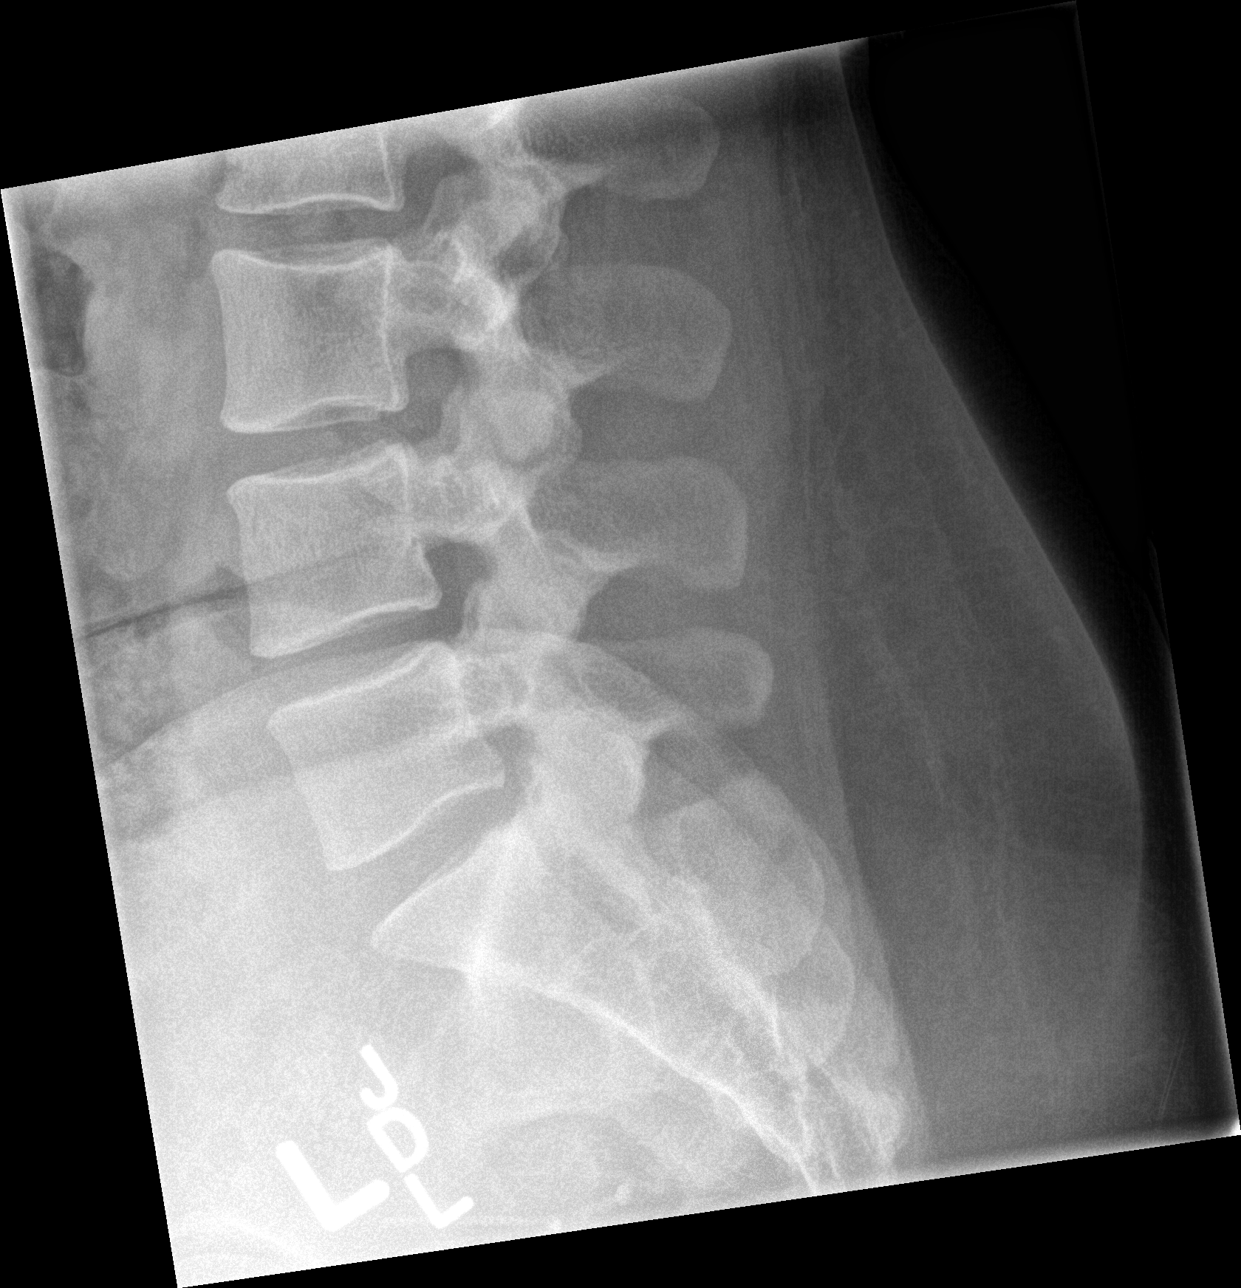

[5 of 5 positions shown; findings below may reference images not displayed]

FINDINGS: There is no evidence of lumbar spine fracture. Alignment is normal.
Intervertebral disc spaces are maintained.
IMPRESSION: Negative.

## 2016-05-13 ENCOUNTER — Ambulatory Visit
Admission: RE | Admit: 2016-05-13 | Discharge: 2016-05-13 | Disposition: A | Discharge status: Home / Self Care | Payer: 59 | Source: Ambulatory Visit | Attending: Family Medicine | Admitting: Family Medicine

## 2016-05-13 DIAGNOSIS — E0500 Thyrotoxicosis with diffuse goiter without thyrotoxic crisis or storm: Secondary | ICD-10-CM

## 2016-05-16 ENCOUNTER — Ambulatory Visit (INDEPENDENT_AMBULATORY_CARE_PROVIDER_SITE_OTHER): Payer: 59 | Admitting: Pediatrics

## 2016-05-16 ENCOUNTER — Encounter: Payer: Self-pay | Admitting: Pediatrics

## 2016-05-16 VITALS — BP 118/76 | HR 68 | Temp 98.5°F | Resp 16 | Ht 63.58 in | Wt 155.8 lb

## 2016-05-16 DIAGNOSIS — J301 Allergic rhinitis due to pollen: Secondary | ICD-10-CM

## 2016-05-16 DIAGNOSIS — T7800XA Anaphylactic reaction due to unspecified food, initial encounter: Secondary | ICD-10-CM | POA: Insufficient documentation

## 2016-05-16 DIAGNOSIS — T7800XD Anaphylactic reaction due to unspecified food, subsequent encounter: Secondary | ICD-10-CM | POA: Diagnosis not present

## 2016-05-16 DIAGNOSIS — J4540 Moderate persistent asthma, uncomplicated: Secondary | ICD-10-CM | POA: Insufficient documentation

## 2016-05-16 MED ORDER — ALBUTEROL SULFATE HFA 108 (90 BASE) MCG/ACT IN AERS: 2.0000 | INHALATION_SPRAY | RESPIRATORY_TRACT | 2 refills | Status: AC | PRN

## 2016-05-16 MED ORDER — LEVOCETIRIZINE DIHYDROCHLORIDE 5 MG PO TABS: 5.0000 mg | ORAL_TABLET | Freq: Every evening | ORAL | 5 refills | Status: AC

## 2016-05-16 MED ORDER — BECLOMETHASONE DIPROPIONATE 80 MCG/ACT IN AERS: 2.0000 | INHALATION_SPRAY | Freq: Two times a day (BID) | RESPIRATORY_TRACT | 5 refills | Status: AC

## 2016-05-16 MED ORDER — EPINEPHRINE 0.3 MG/0.3ML IJ SOAJ: INTRAMUSCULAR | 2 refills | Status: AC

## 2016-05-16 MED ORDER — FLUTICASONE PROPIONATE 50 MCG/ACT NA SUSP: 2.0000 | Freq: Every day | NASAL | 5 refills | Status: AC

## 2016-05-16 MED ORDER — MONTELUKAST SODIUM 10 MG PO TABS: 10.0000 mg | ORAL_TABLET | Freq: Every day | ORAL | 5 refills | Status: DC

## 2016-05-16 NOTE — Patient Instructions (Signed)
Continue on your current medications Add prednisone 20 mg twice a day for 3 days, 20 mg on the fourth day, 10 mg on the fifth day Call me if you're not doing better on this treatment plan  Continue avoiding shrimp and watermelon. If you have  an allergic reaction take Benadryl 50 mg every 4 hours and if you have life-threatening symptoms inject him with EpiPen 0.3 mg

## 2016-05-16 NOTE — Progress Notes (Signed)
[address] [place] [id number] Dept: [phone]  FOLLOW UP NOTE  Patient ID: Lisa Day, female    DOB: 1975-11-05  Age: 40 y.o. MRN: [id number] Date of Office Visit: 05/16/2016  Assessment  Chief Complaint: Breathing Problem (WITH EXERCISE. OR WALKING.)  HPI Lisa Day presents for evaluation of significant nasal congestion, shortness of breath and coughing. We had not seen her since April 2016. Her asthma had been well controlled until the past 2 weeks. She is allergic to shrimp and watermelon.  Current medications are Proventil 2 puffs every 4 hours if needed, fluticasone 2 sprays per nostril once a day, levocetirizine 5 mg once a day, montelukast 10 mg once a day, Qvar 80 -2 puffs twice a day, Benadryl and EpiPen 0.3 mg if needed. . Her other medications are outlined in the chart   Drug Allergies:  Allergies  Allergen Reactions  . Shrimp [Shellfish Allergy] Hives, Shortness Of Breath and Swelling  . Watermelon Concentrate Hives, Shortness Of Breath and Swelling  . Latex Rash    Physical Exam: BP 118/76 (BP Location: Right Arm, Patient Position: Sitting, Cuff Size: Normal)   Pulse 68   Temp 98.5 F (36.9 C) (Oral)   Resp 16   Ht 5' 3.58" (1.615 m)   Wt 155 lb 12.8 oz (70.7 kg)   SpO2 99%   BMI 27.09 kg/m    Physical Exam  Constitutional: She is oriented to person, place, and time. She appears well-developed and well-nourished.  HENT:  Eyes normal. Ears normal. Nose moderate swelling of nasal turbinates with clear nasal discharge. Pharynx normal.  Neck: Neck supple.  Cardiovascular:  S1 and S2 normal no murmurs  Pulmonary/Chest:  Clear to percussion and auscultation  Lymphadenopathy:    She has no cervical adenopathy.  Neurological: She is alert and oriented to person, place, and time.  Psychiatric: She has a normal mood and affect. Her behavior is normal. Judgment and thought content normal.    Diagnostics:  FVC 2.81 L FEV1 2.58 L  predicted FVC 3.07 L predicted FEV1 2.40 L-the spirometry is in the normal range  Assessment and Plan: 1. Moderate persistent asthma, uncomplicated   2. Allergic rhinitis due to pollen   3. Allergy with anaphylaxis due to food, subsequent encounter     Meds ordered this encounter  Medications  . albuterol (PROVENTIL HFA;VENTOLIN HFA) 108 (90 Base) MCG/ACT inhaler    Sig: Inhale 2 puffs into the lungs every 4 (four) hours as needed for wheezing or shortness of breath.    Dispense:  1 Inhaler    Refill:  2  . EPINEPHrine 0.3 mg/0.3 mL IJ SOAJ injection    Sig: USE AS DIRECTED FOR SEVERE ALLERGIC REACTION.    Dispense:  2 Device    Refill:  2  . fluticasone (FLONASE) 50 MCG/ACT nasal spray    Sig: Place 2 sprays into both nostrils at bedtime.    Dispense:  16 g    Refill:  5  . levocetirizine (XYZAL) 5 MG tablet    Sig: Take 1 tablet (5 mg total) by mouth every evening.    Dispense:  30 tablet    Refill:  5  . montelukast (SINGULAIR) 10 MG tablet    Sig: Take 1 tablet (10 mg total) by mouth at bedtime.    Dispense:  30 tablet    Refill:  5  . beclomethasone (QVAR) 80 MCG/ACT inhaler    Sig: Inhale 2 puffs into the lungs 2 (two)  times daily.    Dispense:  1 Inhaler    Refill:  5    Patient Instructions  Continue on your current medications Add prednisone 20 mg twice a day for 3 days, 20 mg on the fourth day, 10 mg on the fifth day Call me if you're not doing better on this treatment plan  Continue avoiding shrimp and watermelon. If you have  an allergic reaction take Benadryl 50 mg every 4 hours and if you have life-threatening symptoms inject him with EpiPen 0.3 mg   Return in about 6 months (around 11/13/2016).    Thank you for the opportunity to care for this patient.  Please do not hesitate to contact me with questions.  Tonette Bihari, M.D.  [facility] [address] [place], [place] [address] [phone]

## 2016-07-22 ENCOUNTER — Emergency Department (HOSPITAL_COMMUNITY): Payer: 59

## 2016-07-22 ENCOUNTER — Emergency Department (HOSPITAL_COMMUNITY)
Admission: EM | Admit: 2016-07-22 | Discharge: 2016-07-22 | Disposition: A | Discharge status: Home / Self Care | Payer: 59 | Attending: Emergency Medicine | Admitting: Emergency Medicine

## 2016-07-22 ENCOUNTER — Encounter (HOSPITAL_COMMUNITY): Payer: Self-pay

## 2016-07-22 DIAGNOSIS — Z91040 Latex allergy status: Secondary | ICD-10-CM | POA: Insufficient documentation

## 2016-07-22 DIAGNOSIS — S93402A Sprain of unspecified ligament of left ankle, initial encounter: Secondary | ICD-10-CM | POA: Diagnosis not present

## 2016-07-22 DIAGNOSIS — J45909 Unspecified asthma, uncomplicated: Secondary | ICD-10-CM | POA: Diagnosis not present

## 2016-07-22 DIAGNOSIS — S82831A Other fracture of upper and lower end of right fibula, initial encounter for closed fracture: Secondary | ICD-10-CM

## 2016-07-22 DIAGNOSIS — S99911A Unspecified injury of right ankle, initial encounter: Secondary | ICD-10-CM | POA: Diagnosis present

## 2016-07-22 DIAGNOSIS — Y92009 Unspecified place in unspecified non-institutional (private) residence as the place of occurrence of the external cause: Secondary | ICD-10-CM | POA: Insufficient documentation

## 2016-07-22 DIAGNOSIS — S82431A Displaced oblique fracture of shaft of right fibula, initial encounter for closed fracture: Secondary | ICD-10-CM | POA: Insufficient documentation

## 2016-07-22 DIAGNOSIS — Y939 Activity, unspecified: Secondary | ICD-10-CM | POA: Diagnosis not present

## 2016-07-22 DIAGNOSIS — W108XXA Fall (on) (from) other stairs and steps, initial encounter: Secondary | ICD-10-CM | POA: Diagnosis not present

## 2016-07-22 DIAGNOSIS — Y999 Unspecified external cause status: Secondary | ICD-10-CM | POA: Insufficient documentation

## 2016-07-22 DIAGNOSIS — Z79899 Other long term (current) drug therapy: Secondary | ICD-10-CM | POA: Diagnosis not present

## 2016-07-22 LAB — BASIC METABOLIC PANEL
Anion gap: 7 (ref 5–15)
BUN: 13 mg/dL (ref 6–20)
CHLORIDE: 104 mmol/L (ref 101–111)
CO2: 25 mmol/L (ref 22–32)
CREATININE: 0.9 mg/dL (ref 0.44–1.00)
Calcium: 8.9 mg/dL (ref 8.9–10.3)
Glucose, Bld: 96 mg/dL (ref 65–99)
POTASSIUM: 3.6 mmol/L (ref 3.5–5.1)
SODIUM: 136 mmol/L (ref 135–145)

## 2016-07-22 LAB — CBC
HCT: 35.2 % — ABNORMAL LOW (ref 36.0–46.0)
Hemoglobin: 12.4 g/dL (ref 12.0–15.0)
MCH: 29.3 pg (ref 26.0–34.0)
MCHC: 35.2 g/dL (ref 30.0–36.0)
MCV: 83.2 fL (ref 78.0–100.0)
Platelets: 291 K/uL (ref 150–400)
RBC: 4.23 MIL/uL (ref 3.87–5.11)
RDW: 13.7 % (ref 11.5–15.5)
WBC: 11.4 K/uL — ABNORMAL HIGH (ref 4.0–10.5)

## 2016-07-22 MED ORDER — MORPHINE SULFATE (PF) 4 MG/ML IV SOLN
4.0000 mg | Freq: Once | INTRAVENOUS | Status: AC
  Administered 2016-07-22: 4 mg via INTRAVENOUS
  Filled 2016-07-22: qty 1

## 2016-07-22 MED ORDER — OXYCODONE-ACETAMINOPHEN 5-325 MG PO TABS: 2.0000 | ORAL_TABLET | ORAL | 0 refills | Status: DC | PRN

## 2016-07-22 NOTE — ED Provider Notes (Signed)
[facility]-EMERGENCY DEPT Provider Note   CSN: [id number] Arrival date & time: 07/22/16  0800     History   Chief Complaint Chief Complaint  Patient presents with  . Fall  . Ankle Injury    HPI Lisa Day is a 40 y.o. female.  HPI  40 y.o. female with a hx of Asthma, Barrasso Disease, presents to the Emergency Department today complaining of right and left ankle pain s/p mechanical fall this AM. Pt states ambulating out of the house to start up the car and fall from one step of stairs. Notes twisting both ankles and falling onto ground. No head trauma or LOC. No other injuries noted. EMS noted deformity to right ankle. No open fracture. No N/V. No numbness/tingling. No CP/SOB/ABD pain. Rates pain 8/10 currently and aching. Given fentanyl by EMS. No other symptoms noted.  Past Medical History:  Diagnosis Date  . Allergic rhinitis   . Asthma   . Fibromyalgia   . Grave's disease   . Headache 07/02/2014    Patient Active Problem List   Diagnosis Date Noted  . Disease of thyroid gland 05/16/2016  . Allergy with anaphylaxis due to food 05/16/2016  . Allergic rhinitis due to pollen 05/16/2016  . Moderate persistent asthma 05/16/2016  . Right hip pain 05/01/2015  . Headache 07/02/2014  . Routine health maintenance 01/02/2013  . S/P cesarean section 04/20/2011  . Current pregnancy with history of pre-term labor 03/17/2011    Past Surgical History:  Procedure Laterality Date  . APPENDECTOMY  2001  . CESAREAN SECTION  04/20/2011   Procedure: CESAREAN SECTION;  Surgeon: Jeani HawkingMichelle L Grewal, MD;  Location: [facility];  Service: Gynecology;  Laterality: N/A;  Primary Cesarean Section with Delivery Baby Boy @ [phone]  . CHOLECYSTECTOMY  2006  . HIP ARTHROSCOPY      OB History    Gravida Para Term Preterm AB Living   2 2 1 1  0 2   SAB TAB Ectopic Multiple Live Births   0 0 0 0 2       Home Medications    Prior to Admission medications   Medication Sig Start Date End Date Taking?  Authorizing Provider  albuterol (PROVENTIL HFA;VENTOLIN HFA) 108 (90 Base) MCG/ACT inhaler Inhale 2 puffs into the lungs every 4 (four) hours as needed for wheezing or shortness of breath. 05/16/16   Fletcher AnonJose A Bardelas, MD  Artificial Saliva [facility](SALIVAMAX) PACK  04/13/16   Historical Provider, MD  beclomethasone (QVAR) 80 MCG/ACT inhaler Inhale 2 puffs into the lungs 2 (two) times daily. 05/16/16   Fletcher AnonJose A Bardelas, MD  cyclobenzaprine (FLEXERIL) 10 MG tablet Take 10 mg by mouth 3 (three) times daily as needed for muscle spasms.    Historical Provider, MD  EPINEPHrine 0.3 mg/0.3 mL IJ SOAJ injection USE AS DIRECTED FOR SEVERE ALLERGIC REACTION. 05/16/16   Fletcher AnonJose A Bardelas, MD  Fluocinonide 0.1 % CREA  04/13/16   Historical Provider, MD  fluticasone (FLONASE) 50 MCG/ACT nasal spray Place 2 sprays into both nostrils at bedtime. 05/16/16   Fletcher AnonJose A Bardelas, MD  ibuprofen (ADVIL,MOTRIN) 800 MG tablet Take 1 tablet (800 mg total) by mouth 3 (three) times daily. 12/10/15   Fayrene HelperBowie Tran, PA-C  levocetirizine (XYZAL) 5 MG tablet Take 1 tablet (5 mg total) by mouth every evening. 05/16/16   Fletcher AnonJose A Bardelas, MD  levonorgestrel (MIRENA) 20 MCG/24HR IUD 1 each by Intrauterine route once.    Historical Provider, MD  levothyroxine (SYNTHROID, LEVOTHROID) 175 MCG tablet  05/09/16   Historical Provider, MD  lidocaine (XYLOCAINE) 5 % ointment  04/13/16   Historical Provider, MD  montelukast (SINGULAIR) 10 MG tablet Take 1 tablet (10 mg total) by mouth at bedtime. 05/16/16   Fletcher Anon, MD  rizatriptan (MAXALT) 10 MG tablet Take 1 tablet (10 mg total) by mouth 3 (three) times daily as needed for migraine. May repeat in 2 hours if needed 07/02/14   York Spaniel, MD  tiZANidine (ZANAFLEX) 2 MG tablet Take 1 tablet (2 mg total) by mouth 2 (two) times daily. 02/26/15   Butch Penny, NP    Family History Family History  Problem Relation Age of Onset  . Hypertension Mother   . Breast cancer Maternal Grandmother   . Allergic  rhinitis Father   . Asthma Father   . Migraines Brother   . Allergic rhinitis Brother   . Migraines Sister   . Angioedema Neg Hx   . Eczema Neg Hx   . Immunodeficiency Neg Hx   . Urticaria Neg Hx     Social History Social History  Substance Use Topics  . Smoking status: Never Smoker  . Smokeless tobacco: Never Used  . Alcohol use No     Allergies   Shrimp [shellfish allergy]; Watermelon concentrate; and Latex   Review of Systems Review of Systems ROS reviewed and all are negative for acute change except as noted in the HPI.  Physical Exam Updated Vital Signs BP 115/77 (BP Location: Left Arm)   Pulse 68   Temp 97.8 F (36.6 C) (Oral)   Resp 18   Ht 5' 3.5" (1.613 m)   Wt 69.4 kg   SpO2 100%   BMI 26.68 kg/m   Physical Exam  Constitutional: She is oriented to person, place, and time. Vital signs are normal. She appears well-developed and well-nourished.  NAD. Resting comfortably.   HENT:  Head: Normocephalic.  Right Ear: Hearing normal.  Left Ear: Hearing normal.  Eyes: Conjunctivae and EOM are normal. Pupils are equal, round, and reactive to light.  Neck: Normal range of motion. Neck supple.  Cardiovascular: Normal rate, regular rhythm, normal heart sounds and intact distal pulses.   Pulmonary/Chest: Effort normal and breath sounds normal.  Musculoskeletal: Normal range of motion.  Obvious deformity noted on right ankle. Left ankle with pain on ROM. No open fracture. Cap refill <2sec. Distal pulses appreciated bilaterally. Motor/sensation intact.    Neurological: She is alert and oriented to person, place, and time.  Skin: Skin is warm and dry.  Psychiatric: She has a normal mood and affect. Her speech is normal and behavior is normal. Thought content normal.  Nursing note and vitals reviewed.  ED Treatments / Results  Labs (all labs ordered are listed, but only abnormal results are displayed) Labs Reviewed  CBC - Abnormal; Notable for the following:        Result Value   WBC 11.4 (*)    HCT 35.2 (*)    All other components within normal limits  BASIC METABOLIC PANEL   EKG  EKG Interpretation None      Radiology Dg Ankle Complete Left  Result Date: 07/22/2016 CLINICAL DATA:  Left ankle pain after injury today. EXAM: LEFT ANKLE COMPLETE - 3+ VIEW COMPARISON:  None. FINDINGS: There is no evidence of fracture, dislocation, or joint effusion. There is no evidence of arthropathy or other focal bone abnormality. Soft tissues are unremarkable. IMPRESSION: Normal left ankle. Electronically Signed   By: Lupita Raider, M.D.  On: 07/22/2016 09:08   Dg Ankle Complete Right  Result Date: 07/22/2016 CLINICAL DATA:  Right ankle pain after injury at home today. EXAM: RIGHT ANKLE - COMPLETE 3+ VIEW COMPARISON:  None. FINDINGS: Minimally displaced oblique fracture is seen involving the distal right fibula. Overlying soft tissue swelling is noted. No dislocation is noted. Joints appear to be intact. IMPRESSION: Minimally displaced distal right fibular oblique fracture. Electronically Signed   By: Lupita RaiderJames  Green Jr, M.D.   On: 07/22/2016 09:07    Procedures Procedures (including critical care time)  Medications Ordered in ED Medications  morphine 4 MG/ML injection 4 mg (not administered)     Initial Impression / Assessment and Plan / ED Course  I have reviewed the triage vital signs and the nursing notes.  Pertinent labs & imaging results that were available during my care of the patient were reviewed by me and considered in my medical decision making (see chart for details).  Clinical Course    Final Clinical Impressions(s) / ED Diagnoses  {I have reviewed and evaluated the relevant laboratory values. {I have reviewed and evaluated the relevant imaging studies.  {I have reviewed the relevant previous healthcare records. {I have reviewed EMS Documentation. {I obtained HPI from historian. {Patient discussed with supervising physician.  ED  Course:  Assessment: Pt is a 40yF with hx Asthma, Couzens Disease who presents with bilateral ankle pain s/p mechanical fall. Deformity noted right ankle by EMS. On exam, pt in NAD. Nontoxic/nonseptic appearing. VSS. Afebrile. Lungs CTA. Heart RRR. BLE pulses appreciated bilaterally with motor/sensation intact. Labs obtained. DG right ankle with minimally displaced fibular fracture. DG left ankle unremarkable. Will splint right ankle with [place] and given crutches. ASO for left ankle. Given analgesia in ED. NPO since last night. [facility] (Dr. Caswell CorwinStubbs) Plan is to DC home with pain medication and follow up with Ortho. Pt will call for appointment. At time of discharge, Patient is in no acute distress. Vital Signs are stable. Patient is able to ambulate. Patient able to tolerate PO.   Disposition/Plan:  DC Home Additional Verbal discharge instructions given and discussed with patient.  Pt Instructed to f/u with Ortho in the next week for evaluation and treatment of symptoms. Return precautions given Pt acknowledges and agrees with plan  Supervising Physician Laurence Spatesachel Morgan Little, MD  Final diagnoses:  Closed fracture of distal end of right fibula, unspecified fracture morphology, initial encounter  Sprain of left ankle, unspecified ligament, initial encounter    New Prescriptions New Prescriptions   No medications on file     Audry Piliyler Doreather Hoxworth, PA-C 07/22/16 0932    Laurence Spatesachel Morgan Little, MD 07/22/16 1115

## 2016-07-22 NOTE — ED Triage Notes (Addendum)
Per EMS- Patient reports that she was walking out into her garage to start her car and tripped on the bottom step and fell, injuring her left ankle. Patient has swelling and deformity to the left ankle. + left pedal pulse. No LOC, patient did not hit her head. Fentanyl 100 mcg given IV prior to arrival to the ED.

## 2016-07-22 NOTE — Discharge Instructions (Signed)
Please read and follow all provided instructions.  Your diagnoses today include:  1. Closed fracture of distal end of right fibula, unspecified fracture morphology, initial encounter   2. Sprain of left ankle, unspecified ligament, initial encounter     Tests performed today include: Vital signs. See below for your results today.   Medications prescribed:  Take as prescribed   Home care instructions:  Follow any educational materials contained in this packet.  DO NOT PUT WEIGHT ON RIGHT ANKLE.  Follow-up instructions: Please follow-up with your Ortho provider provider for further evaluation of symptoms and treatment   Return instructions:  Please return to the Emergency Department if you do not get better, if you get worse, or new symptoms OR  - Fever (temperature greater than 101.61F)  - Bleeding that does not stop with holding pressure to the area    -Severe pain (please note that you may be more sore the day after your accident)  - Chest Pain  - Difficulty breathing  - Severe nausea or vomiting  - Inability to tolerate food and liquids  - Passing out  - Skin becoming red around your wounds  - Change in mental status (confusion or lethargy)  - New numbness or weakness    Please return if you have any other emergent concerns.  Additional Information:  Your vital signs today were: BP 115/77 (BP Location: Left Arm)    Pulse 68    Temp 97.8 F (36.6 C) (Oral)    Resp 18    Ht 5' 3.5" (1.613 m)    Wt 69.4 kg    SpO2 100%    BMI 26.68 kg/m  If your blood pressure (BP) was elevated above 135/85 this visit, please have this repeated by your doctor within one month. ---------------

## 2016-07-22 NOTE — ED Notes (Signed)
Bed: WA17 Expected date:  Expected time:  Means of arrival:  Comments: EMS-fall/ankle deformity

## 2016-07-22 NOTE — ED Notes (Signed)
Patient transported to X-ray 

## 2016-08-08 ENCOUNTER — Encounter: Payer: Self-pay | Admitting: Internal Medicine

## 2016-08-08 ENCOUNTER — Ambulatory Visit (INDEPENDENT_AMBULATORY_CARE_PROVIDER_SITE_OTHER): Payer: 59 | Admitting: Internal Medicine

## 2016-08-08 VITALS — BP 116/78 | HR 82

## 2016-08-08 DIAGNOSIS — E890 Postprocedural hypothyroidism: Secondary | ICD-10-CM

## 2016-08-08 MED ORDER — TIROSINT 150 MCG PO CAPS: ORAL_CAPSULE | ORAL | 5 refills | Status: DC

## 2016-08-08 NOTE — Patient Instructions (Addendum)
Please change to Tirosint 150 mcg daily.  Take the thyroid hormone every day, with water, at least 30 minutes before breakfast, separated by at least 4 hours from: - acid reflux medications - calcium - iron - multivitamins  Please return in 1.5 months for repeat labs.  Please come back for a follow-up appointment in 3 months.   Hypothyroidism Hypothyroidism is a disorder of the thyroid. The thyroid is a large gland that is located in the lower front of the neck. The thyroid releases hormones that control how the body works. With hypothyroidism, the thyroid does not make enough of these hormones. What are the causes? Causes of hypothyroidism may include:  Viral infections.  Pregnancy.  Your own defense system (immune system) attacking your thyroid.  Certain medicines.  Birth defects.  Past radiation treatments to your head or neck.  Past treatment with radioactive iodine.  Past surgical removal of part or all of your thyroid.  Problems with the gland that is located in the center of your brain (pituitary). What are the signs or symptoms? Signs and symptoms of hypothyroidism may include:  Feeling as though you have no energy (lethargy).  Inability to tolerate cold.  Weight gain that is not explained by a change in diet or exercise habits.  Dry skin.  Coarse hair.  Menstrual irregularity.  Slowing of thought processes.  Constipation.  Sadness or depression. How is this diagnosed? Your health care provider may diagnose hypothyroidism with blood tests and ultrasound tests. How is this treated? Hypothyroidism is treated with medicine that replaces the hormones that your body does not make. After you begin treatment, it may take several weeks for symptoms to go away. Follow these instructions at home:  Take medicines only as directed by your health care provider.  If you start taking any new medicines, tell your health care provider.  Keep all follow-up  visits as directed by your health care provider. This is important. As your condition improves, your dosage needs may change. You will need to have blood tests regularly so that your health care provider can watch your condition. Contact a health care provider if:  Your symptoms do not get better with treatment.  You are taking thyroid replacement medicine and:  You sweat excessively.  You have tremors.  You feel anxious.  You lose weight rapidly.  You cannot tolerate heat.  You have emotional swings.  You have diarrhea.  You feel weak. Get help right away if:  You develop chest pain.  You develop an irregular heartbeat.  You develop a rapid heartbeat. This information is not intended to replace advice given to you by your health care provider. Make sure you discuss any questions you have with your health care provider. Document Released: 08/08/2005 Document Revised: 01/14/2016 Document Reviewed: 12/24/2013 Elsevier Interactive Patient Education  2017 [employer]

## 2016-08-08 NOTE — Progress Notes (Signed)
Patient ID: Lisa Day, female   DOB: Dec 30, 1975, 40 y.o.   MRN: [id number]    HPI  Lisa Day is a 40 y.o.-year-old female, referred by her PCP, Dr. 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  Pt. 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  She takes the thyroid hormone: - daily, rarely misses a dose - fasting - with water - separated by >2 h from b'fast  - no calcium, iron, PPIs, multivitamins   I reviewed pt's thyroid tests - very variable: 08/03/2016: TSH 22.47 06/20/2016: TSH 8.52  05/03/2016: TSH 47.3, free T4 0.28, free T3 2.36 Lab Results  Component Value Date   TSH 0.701 03/16/2011   05/25/2008: TSH 0.01, free T4 4.58   Pt describes: - no weight gain - + fatigue - + heat and cold intolerance - + anxiety and depression - + constipation and diarrhea - + palpitations - no dry skin - + hair loss (last 2 months)  Pt denies feeling nodules in neck, but has + hoarseness, + dysphagia/no odynophagia, SOB with lying down.  She has no FH of thyroid disorders. No FH of thyroid cancer. RA on father's side of the family. No h/o radiation tx to head or neck. No recent use of iodine supplements. No Biotin.   Pt. also has a history of asthma.  ROS: Constitutional: see HPI Eyes: no blurry vision, no xerophthalmia ENT: no sore throat, + see HPI Cardiovascular: no CP/SOB/+ palpitations/no leg swelling Respiratory: no cough/SOB Gastrointestinal: no N/V/+ D/+ C/+ reflux Musculoskeletal: + muscle/+ joint aches Skin: no rashes, + hair loss Neurological: no tremors/numbness/tingling/dizziness Psychiatric: + depression/+ anxiety  Past Medical History:  Diagnosis Date  . Allergic rhinitis   . Anxiety and depression   . Asthma   . Fibromyalgia   . Grave's disease   . Headache 07/02/2014   Past Surgical History:   Procedure Laterality Date  . APPENDECTOMY  2001  . CESAREAN SECTION  04/20/2011   Procedure: CESAREAN SECTION;  Surgeon: Jeani HawkingMichelle L Grewal, MD;  Location: [facility] OR[facility];  Service: Gynecology;  Laterality: N/A;  Primary Cesarean Section with Delivery Baby Boy @ [phone]  . CHOLECYSTECTOMY  2006  . HIP ARTHROSCOPY     Social History   Social History  . Marital status: Single    Spouse name: N/A  . 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 Smoking status: Never Smoker  . Smokeless tobacco: Never Used  . Alcohol use No  . Drug use: No   Current Outpatient Prescriptions on File Prior to Visit  Medication Sig Dispense Refill  . acetaminophen (TYLENOL) 325 MG tablet Take 650 mg by mouth every 6 (six) hours as needed for moderate pain or headache.    . albuterol (PROVENTIL HFA;VENTOLIN HFA) 108 (90 Base) MCG/ACT inhaler Inhale 2 puffs into the lungs every 4 (four) hours as needed for wheezing or shortness of breath. 1 Inhaler 2  . beclomethasone (QVAR) 80 MCG/ACT inhaler Inhale 2 puffs into the lungs 2 (two) times daily. (Patient taking differently: Inhale 1 puff into the lungs 2 (two) times daily. ) 1 Inhaler 5  . EPINEPHrine 0.3 mg/0.3 mL IJ SOAJ injection USE AS DIRECTED FOR SEVERE ALLERGIC REACTION. (Patient taking differently: Inject 0.3 mg into the skin once as needed (allergic reaction). USE  AS DIRECTED FOR SEVERE ALLERGIC REACTION.) 2 Device 2  . levocetirizine (XYZAL) 5 MG tablet Take 1 tablet (5 mg total) by mouth every evening. 30 tablet 5  . levonorgestrel (MIRENA) 20 MCG/24HR IUD 1 each by Intrauterine route once.    . montelukast (SINGULAIR) 10 MG tablet Take 1 tablet (10 mg total) by mouth at bedtime. 30 tablet 5  . cyclobenzaprine (FLEXERIL) 10 MG tablet Take 10 mg by mouth 3 (three) times daily as needed for muscle spasms.    . fluticasone (FLONASE) 50 MCG/ACT nasal spray Place 2 sprays into both nostrils at bedtime. (Patient not taking:  Reported on 08/08/2016) 16 g 5  . lidocaine (XYLOCAINE) 5 % ointment Apply 1 application topically 2 (two) times daily as needed for moderate pain.     Marland Kitchen. oxyCODONE-acetaminophen (PERCOCET/ROXICET) 5-325 MG tablet Take 2 tablets by mouth every 4 (four) hours as needed for severe pain. (Patient not taking: Reported on 08/08/2016) 15 tablet 0  . rizatriptan (MAXALT) 10 MG tablet Take 1 tablet (10 mg total) by mouth 3 (three) times daily as needed for migraine. May repeat in 2 hours if needed (Patient not taking: Reported on 08/08/2016) 10 tablet 3   No current facility-administered medications on file prior to visit.    Allergies  Allergen Reactions  . Shrimp [Shellfish Allergy] Hives, Shortness Of Breath and Swelling  . Watermelon Concentrate Hives, Shortness Of Breath and Swelling  . Latex Rash   Family History  Problem Relation Age of Onset  . Hypertension Mother   . Breast cancer Maternal Grandmother   . Allergic rhinitis Father   . Asthma Father   . Migraines Brother   . Allergic rhinitis Brother   . Migraines Sister   . Angioedema Neg Hx   . Eczema Neg Hx   . Immunodeficiency Neg Hx   . Urticaria Neg Hx    PE: BP 116/78   Pulse 82   SpO2 99%  Wt Readings from Last 3 Encounters:  07/22/16 153 lb (69.4 kg)  05/16/16 155 lb 12.8 oz (70.7 kg)  12/10/15 153 lb (69.4 kg)   Constitutional: overweight, in NAD Eyes: PERRLA, EOMI, no exophthalmos ENT: moist mucous membranes, no thyromegaly, no cervical lymphadenopathy Cardiovascular: RRR, No MRG Respiratory: CTA B Gastrointestinal: abdomen soft, NT, ND, BS+ Musculoskeletal: no deformities, strength intact in all 4, R ankle in cast Skin: moist, warm, no rashes Neurological: no tremor with outstretched hands, DTR normal in all 4  ASSESSMENT: 1. Postablative Hypothyroidism  PLAN:  1. Patient with long-standing uncontrolled hypothyroidism, on a rather large dose of levothyroxine therapy. Despite her recent increase in dose, her  TSH increased at last check, which suggests noncompliance with the medication. She, however, denies missing doses... - she appears euthyroid; she does not appear to have a goiter, thyroid nodules, or neck compression symptoms - We discussed about correct intake of levothyroxine, fasting, with water, separated by at least 30 minutes from breakfast, and separated by more than 4 hours from calcium, iron, multivitamins, acid reflux medications (PPIs). She is taking it correctly. - I suggested to change to Tirosint (liquid LT4) for better absorption. Will have to try a lower dose 150, as this is the largest capsule. Will recheck labs in 5 weeks and make appropriate changes in her dose then. Given Tirosint coupon. - Otherwise, I will see her back in 3 months  Orders Placed This Encounter  Procedures  . T4, free  . TSH   Carlus Pavlovristina Raphael Fitzpatrick, MD PhD [facility]

## 2016-08-29 ENCOUNTER — Ambulatory Visit: Payer: 59 | Admitting: Pediatrics

## 2016-08-30 DIAGNOSIS — M25571 Pain in right ankle and joints of right foot: Secondary | ICD-10-CM | POA: Diagnosis not present

## 2016-09-13 ENCOUNTER — Other Ambulatory Visit: Payer: Self-pay | Admitting: Obstetrics and Gynecology

## 2016-09-13 DIAGNOSIS — R921 Mammographic calcification found on diagnostic imaging of breast: Secondary | ICD-10-CM

## 2016-09-19 ENCOUNTER — Other Ambulatory Visit (INDEPENDENT_AMBULATORY_CARE_PROVIDER_SITE_OTHER): Payer: 59

## 2016-09-19 DIAGNOSIS — E890 Postprocedural hypothyroidism: Secondary | ICD-10-CM | POA: Diagnosis not present

## 2016-09-19 LAB — T4, FREE: FREE T4: 0.7 ng/dL (ref 0.60–1.60)

## 2016-09-19 LAB — TSH: TSH: 10.06 u[IU]/mL — AB (ref 0.35–4.50)

## 2016-09-19 MED ORDER — TIROSINT 25 MCG PO CAPS: 25.0000 ug | ORAL_CAPSULE | Freq: Every day | ORAL | 1 refills | Status: DC

## 2016-09-20 DIAGNOSIS — M25571 Pain in right ankle and joints of right foot: Secondary | ICD-10-CM | POA: Diagnosis not present

## 2016-09-29 DIAGNOSIS — Z01419 Encounter for gynecological examination (general) (routine) without abnormal findings: Secondary | ICD-10-CM | POA: Diagnosis not present

## 2016-10-05 ENCOUNTER — Ambulatory Visit
Admission: RE | Admit: 2016-10-05 | Discharge: 2016-10-05 | Disposition: A | Discharge status: Home / Self Care | Payer: 59 | Source: Ambulatory Visit | Attending: Obstetrics and Gynecology | Admitting: Obstetrics and Gynecology

## 2016-10-05 DIAGNOSIS — R921 Mammographic calcification found on diagnostic imaging of breast: Secondary | ICD-10-CM | POA: Diagnosis not present

## 2016-10-18 DIAGNOSIS — M25571 Pain in right ankle and joints of right foot: Secondary | ICD-10-CM | POA: Diagnosis not present

## 2016-10-19 DIAGNOSIS — M25571 Pain in right ankle and joints of right foot: Secondary | ICD-10-CM | POA: Diagnosis not present

## 2016-10-19 DIAGNOSIS — S82831D Other fracture of upper and lower end of right fibula, subsequent encounter for closed fracture with routine healing: Secondary | ICD-10-CM | POA: Diagnosis not present

## 2016-10-26 DIAGNOSIS — S82831D Other fracture of upper and lower end of right fibula, subsequent encounter for closed fracture with routine healing: Secondary | ICD-10-CM | POA: Diagnosis not present

## 2016-10-26 DIAGNOSIS — M25571 Pain in right ankle and joints of right foot: Secondary | ICD-10-CM | POA: Diagnosis not present

## 2016-11-03 DIAGNOSIS — S82831D Other fracture of upper and lower end of right fibula, subsequent encounter for closed fracture with routine healing: Secondary | ICD-10-CM | POA: Diagnosis not present

## 2016-11-03 DIAGNOSIS — M25571 Pain in right ankle and joints of right foot: Secondary | ICD-10-CM | POA: Diagnosis not present

## 2016-11-07 ENCOUNTER — Ambulatory Visit (INDEPENDENT_AMBULATORY_CARE_PROVIDER_SITE_OTHER): Payer: 59 | Admitting: Internal Medicine

## 2016-11-07 ENCOUNTER — Encounter: Payer: Self-pay | Admitting: Internal Medicine

## 2016-11-07 VITALS — BP 112/68 | HR 85 | Wt 166.0 lb

## 2016-11-07 DIAGNOSIS — E890 Postprocedural hypothyroidism: Secondary | ICD-10-CM | POA: Diagnosis not present

## 2016-11-07 NOTE — Progress Notes (Signed)
Patient ID: Lisa Day, female   DOB: 1976-03-21, 41 y.o.   MRN: [id number]    HPI  Lisa Day is a 41 y.o.-year-old female, initially referred by her PCP, Dr. 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 Last visit 3 mo ago.  Reviewed and addended hx: Pt. 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  She feels more nauseated and has migraines since starting Tirosint.  She takes the thyroid hormone: - daily, no skipped doses - fasting - with water - separated by 3 h from b'fast  - no calcium, iron, PPIs, multivitamins   I reviewed pt's thyroid tests: Lab Results  Component Value Date   TSH 10.06 (H) 09/19/2016   TSH 0.701 03/16/2011   FREET4 0.70 09/19/2016  08/03/2016: TSH 22.47 06/20/2016: TSH 8.52  05/03/2016: TSH 47.3, free T4 0.28, free T3 2.36 05/25/2008: TSH 0.01, free T4 4.58   Pt describes: - + weight gain - + fatigue - + heat and cold intolerance - + constipation and diarrhea; now also nausea - resolved palpitations - no dry skin - resolved hair loss  Pt denies feeling nodules in neck, hoarseness, but has + dysphagia (feels thick secretions in throat) /no odynophagia, SOB with lying down.  She has no FH of thyroid disorders. No FH of thyroid cancer. RA on father's side of the family. No h/o radiation tx to head or neck. No use of iodine supplements. No Biotin.   Pt. also has a history of asthma.  ROS: Constitutional: see HPI Eyes: + blurry vision, no xerophthalmia ENT: no sore throat, + see HPI Cardiovascular: no CP/SOB/palpitations/leg swelling Respiratory: no cough/SOB Gastrointestinal: + N/V/+ D/+ C/no reflux Musculoskeletal: + muscle/+ joint aches Skin: no rashes, no hair loss Neurological: no tremors/numbness/tingling/dizziness, + HA  I reviewed pt's  medications, allergies, PMH, social hx, family hx, and changes were documented in the history of present illness. Otherwise, unchanged from my initial visit note.  Past Medical History:  Diagnosis Date  . Allergic rhinitis   . Anxiety and depression   . Asthma   . Fibromyalgia   . Grave's disease   . Headache 07/02/2014   Past Surgical History:  Procedure Laterality Date  . APPENDECTOMY  2001  . CESAREAN SECTION  04/20/2011   Procedure: CESAREAN SECTION;  Surgeon: Jeani Hawking, MD;  Location: [facility];  Service: Gynecology;  Laterality: N/A;  Primary Cesarean Section with Delivery Baby Boy @ [phone]  . CHOLECYSTECTOMY  2006  . HIP ARTHROSCOPY     Social History   Social History  . Marital status: Single    Spouse name: N/A  . 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 Smoking status: Never Smoker  . Smokeless tobacco: Never Used  . Alcohol use No  . Drug use: No   Current Outpatient Prescriptions on File Prior to Visit  Medication Sig Dispense Refill  . acetaminophen (TYLENOL) 325 MG tablet Take 650 mg by mouth every 6 (six) hours as needed for moderate pain or headache.    . albuterol (PROVENTIL HFA;VENTOLIN HFA) 108 (90 Base) MCG/ACT inhaler Inhale 2 puffs into the lungs every 4 (four) hours as needed for wheezing or shortness of breath. 1 Inhaler 2  . beclomethasone (QVAR) 80  MCG/ACT inhaler Inhale 2 puffs into the lungs 2 (two) times daily. (Patient taking differently: Inhale 1 puff into the lungs 2 (two) times daily. ) 1 Inhaler 5  . cyclobenzaprine (FLEXERIL) 10 MG tablet Take 10 mg by mouth 3 (three) times daily as needed for muscle spasms.    . fluticasone (FLONASE) 50 MCG/ACT nasal spray Place 2 sprays into both nostrils at bedtime. 16 g 5  . levocetirizine (XYZAL) 5 MG tablet Take 1 tablet (5 mg total) by mouth every evening. 30 tablet 5  . levonorgestrel (MIRENA) 20 MCG/24HR IUD 1 each by Intrauterine route once.     . montelukast (SINGULAIR) 10 MG tablet Take 1 tablet (10 mg total) by mouth at bedtime. 30 tablet 5  . rizatriptan (MAXALT) 10 MG tablet Take 1 tablet (10 mg total) by mouth 3 (three) times daily as needed for migraine. May repeat in 2 hours if needed 10 tablet 3  . TIROSINT 150 MCG CAPS Take by mouth daily as advised 45 capsule 5  . TIROSINT 25 MCG CAPS Take 1 capsule (25 mcg total) by mouth daily before breakfast. Take along with the 150 mcg capsule, for a total of 175 mcg daily. 45 capsule 1  . EPINEPHrine 0.3 mg/0.3 mL IJ SOAJ injection USE AS DIRECTED FOR SEVERE ALLERGIC REACTION. (Patient not taking: Reported on 11/07/2016) 2 Device 2   No current facility-administered medications on file prior to visit.    Allergies  Allergen Reactions  . Shrimp [Shellfish Allergy] Hives, Shortness Of Breath and Swelling  . Watermelon Concentrate Hives, Shortness Of Breath and Swelling  . Latex Rash   Family History  Problem Relation Age of Onset  . Hypertension Mother   . Breast cancer Maternal Grandmother   . Allergic rhinitis Father   . Asthma Father   . Migraines Brother   . Allergic rhinitis Brother   . Migraines Sister   . Angioedema Neg Hx   . Eczema Neg Hx   . Immunodeficiency Neg Hx   . Urticaria Neg Hx    PE: BP 112/68 (BP Location: Left Arm, Patient Position: Sitting)   Pulse 85   Wt 166 lb (75.3 kg)   SpO2 98%   BMI 28.94 kg/m  Wt Readings from Last 3 Encounters:  11/07/16 166 lb (75.3 kg)  07/22/16 153 lb (69.4 kg)  05/16/16 155 lb 12.8 oz (70.7 kg)   Constitutional: overweight, in NAD Eyes: PERRLA, EOMI, no exophthalmos ENT: moist mucous membranes, no thyromegaly, no cervical lymphadenopathy Cardiovascular: RRR, No MRG Respiratory: CTA B Gastrointestinal: abdomen soft, NT, ND, BS+ Musculoskeletal: no deformities, strength intact in all 4, R ankle in cast Skin: moist, warm, no rashes Neurological: no tremor with outstretched hands, DTR normal in all  4  ASSESSMENT: 1. Postablative Hypothyroidism  PLAN:  1. Patient with long-standing uncontrolled hypothyroidism, on a rather large dose of levothyroxine therapy. She has had poor control for hypothyroidism, with fluctuating TFTs, mostly elevated TSH despite the large dose of levothyroxine. At last visit, to increase the absorption of levothyroxine, we switched to the liquid formulation, Tirosint. I initially suggested a lower dose, 150 g daily, however, her TSH returned still elevated, at 10 on this dose. Therefore, we added a 25 g daily, for a total of 175 g daily, which she continues today. However, he does me that she is not feeling well on the medication, with headaches and nausea. She also has weight gain of 13 pounds in the last 3 months and also fatigue associated  with heat and cold intolerance. - At this visit, we decided to switch back to tablets of levothyroxine, but I suggested the brand name Synthroid. Also, I suggested to stay with the 50 g tablets, which do not have dyes or other excipient. - Will check TFTs today and send the appropriate dose when labs are back. - We discussed about correct intake of levothyroxine, fasting, with water, separated by at least 30 minutes from breakfast, and separated by more than 4 hours from calcium, iron, multivitamins, acid reflux medications (PPIs). She is taking it correctly. - I will see her back in 4 months  Needs Synthroid 50 tabs.  Component     Latest Ref Rng & Units 11/07/2016  T4,Free(Direct)     0.60 - 1.60 ng/dL 0.86 (L)  TSH     5.78 - 4.50 uIU/mL 19.70 (H)   Her TSH is up, despite increasing the dose of Tirosint. I suspect possible noncompliance. Will send a prescription for 50 g of Synthroid to her pharmacy >> take 4 daily (200 g total) and have her back for labs in 1.5 months.  Carlus Pavlov, MD PhD [facility] Endocrinology

## 2016-11-07 NOTE — Patient Instructions (Signed)
Please continue Tirosint 175 mcg daily.  Take the thyroid hormone every day, with water, at least 30 minutes before breakfast, separated by at least 4 hours from: - acid reflux medications - calcium - iron - multivitamins  Please stop at the lab.  Please come back for a follow-up appointment in 4 months.

## 2016-11-08 LAB — TSH: TSH: 19.7 u[IU]/mL — ABNORMAL HIGH (ref 0.35–4.50)

## 2016-11-08 LAB — T4, FREE: FREE T4: 0.5 ng/dL — AB (ref 0.60–1.60)

## 2016-11-08 MED ORDER — LEVOTHYROXINE SODIUM 50 MCG PO TABS: 200.0000 ug | ORAL_TABLET | Freq: Every day | ORAL | 1 refills | Status: DC

## 2016-11-09 DIAGNOSIS — M25571 Pain in right ankle and joints of right foot: Secondary | ICD-10-CM | POA: Diagnosis not present

## 2016-11-09 DIAGNOSIS — S82831D Other fracture of upper and lower end of right fibula, subsequent encounter for closed fracture with routine healing: Secondary | ICD-10-CM | POA: Diagnosis not present

## 2016-11-15 DIAGNOSIS — M25571 Pain in right ankle and joints of right foot: Secondary | ICD-10-CM | POA: Diagnosis not present

## 2016-11-15 DIAGNOSIS — S82831D Other fracture of upper and lower end of right fibula, subsequent encounter for closed fracture with routine healing: Secondary | ICD-10-CM | POA: Diagnosis not present

## 2016-11-30 DIAGNOSIS — M25571 Pain in right ankle and joints of right foot: Secondary | ICD-10-CM | POA: Diagnosis not present

## 2016-11-30 DIAGNOSIS — S82831D Other fracture of upper and lower end of right fibula, subsequent encounter for closed fracture with routine healing: Secondary | ICD-10-CM | POA: Diagnosis not present

## 2016-12-06 DIAGNOSIS — S82831D Other fracture of upper and lower end of right fibula, subsequent encounter for closed fracture with routine healing: Secondary | ICD-10-CM | POA: Diagnosis not present

## 2016-12-06 DIAGNOSIS — M25571 Pain in right ankle and joints of right foot: Secondary | ICD-10-CM | POA: Diagnosis not present

## 2016-12-23 DIAGNOSIS — Z13220 Encounter for screening for lipoid disorders: Secondary | ICD-10-CM | POA: Diagnosis not present

## 2016-12-23 DIAGNOSIS — R5382 Chronic fatigue, unspecified: Secondary | ICD-10-CM | POA: Diagnosis not present

## 2016-12-23 DIAGNOSIS — Z0000 Encounter for general adult medical examination without abnormal findings: Secondary | ICD-10-CM | POA: Diagnosis not present

## 2017-01-31 ENCOUNTER — Other Ambulatory Visit: Payer: Self-pay | Admitting: Allergy

## 2017-02-15 ENCOUNTER — Other Ambulatory Visit: Payer: Self-pay | Admitting: Allergy

## 2017-02-15 MED ORDER — MONTELUKAST SODIUM 10 MG PO TABS: 10.0000 mg | ORAL_TABLET | Freq: Every day | ORAL | 0 refills | Status: AC

## 2017-03-08 DIAGNOSIS — M1990 Unspecified osteoarthritis, unspecified site: Secondary | ICD-10-CM | POA: Diagnosis not present

## 2017-03-08 DIAGNOSIS — M797 Fibromyalgia: Secondary | ICD-10-CM | POA: Diagnosis not present

## 2017-03-09 ENCOUNTER — Encounter: Payer: Self-pay | Admitting: Internal Medicine

## 2017-03-09 ENCOUNTER — Ambulatory Visit (INDEPENDENT_AMBULATORY_CARE_PROVIDER_SITE_OTHER): Payer: 59 | Admitting: Internal Medicine

## 2017-03-09 VITALS — BP 124/80 | HR 84 | Wt 169.0 lb

## 2017-03-09 DIAGNOSIS — R51 Headache: Secondary | ICD-10-CM | POA: Diagnosis not present

## 2017-03-09 DIAGNOSIS — E890 Postprocedural hypothyroidism: Secondary | ICD-10-CM

## 2017-03-09 DIAGNOSIS — R519 Headache, unspecified: Secondary | ICD-10-CM

## 2017-03-09 NOTE — Patient Instructions (Signed)
Please continue Synthroid 200 mcg daily.  Take the thyroid hormone every day, with water, at least 30 minutes before breakfast, separated by at least 4 hours from: - acid reflux medications - calcium - iron - multivitamins  Please stop at the lab.  Please come back for a follow-up appointment in 6 months.

## 2017-03-09 NOTE — Progress Notes (Signed)
Patient ID: Lisa Day, female   DOB: 1976-01-05, 41 y.o.   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 Last visit 4 mo ago.  Reviewed and addended hx: Pt. 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   She had more nausea and migraines since starting Tirosint.   At last visit, we changed to Synthroid DAW.  She has no more nausea on this, but still has migraines.  Pt is on Synthroid 50 mcg x 4  (200 mcg) daily, taken: - daily >> no missed doses - in am - fasting - at least 30 min from b'fast - no Ca, Fe, MVI, PPIs - not on Biotin  I reviewed pt's thyroid tests: Lab Results  Component Value Date   TSH 19.70 (H) 11/07/2016   TSH 10.06 (H) 09/19/2016   TSH 0.701 03/16/2011   FREET4 0.50 (L) 11/07/2016   FREET4 0.70 09/19/2016  08/03/2016: TSH 22.47 06/20/2016: TSH 8.52  05/03/2016: TSH 47.3, free T4 0.28, free T3 2.36 05/25/2008: TSH 0.01, free T4 4.58   Pt describes: - + weight gain - + fatigue - + HAs - + cold intolerance - + constipation - improved palpitations - no dry skin - resolved hair loss  She has no FH of thyroid disorders. Pt denies: - feeling nodules in neck - hoarseness - dysphagia - choking - SOB with lying down  No FH of thyroid cancer. No h/o radiation tx to head or neck.  No seaweed or kelp. No recent contrast studies. No herbal supplements. No Biotin use. No recent steroids use.   + H/o asthma.  ROS: Constitutional:  + see HPI Eyes: + blurry vision, no xerophthalmia ENT: no sore throat, no nodules palpated in throat, no dysphagia, no odynophagia, no hoarseness Cardiovascular: no CP/+ SOB/no palpitations/+ leg swelling Respiratory: no cough/+ SOB/no wheezing Gastrointestinal: no N/no  V/no D/+ C/no acid reflux Musculoskeletal: + muscle aches/+ joint aches Skin: no rashes, no hair loss Neurological: no tremors/no numbness/no tingling/no dizziness, + HA  I reviewed pt's medications, allergies, PMH, social hx, family hx, and changes were documented in the history of present illness. Otherwise, unchanged from my initial visit note.  Past Medical History:  Diagnosis Date  . Allergic rhinitis   . Anxiety and depression   . Asthma   . Fibromyalgia   . Grave's disease   . Headache 07/02/2014   Past Surgical History:  Procedure Laterality Date  . APPENDECTOMY  2001  . CESAREAN SECTION  04/20/2011   Procedure: CESAREAN SECTION;  Surgeon: Jeani Hawking, MD;  Location: [facility];  Service: Gynecology;  Laterality: N/A;  Primary Cesarean Section with Delivery Baby Boy @ [phone]  . CHOLECYSTECTOMY  2006  . HIP ARTHROSCOPY     Social History   Social History  . Marital status: Single    Spouse name: N/A  . 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 Smoking status: Never Smoker  . Smokeless tobacco: Never Used  . Alcohol use No  . Drug use: No   Current Outpatient Prescriptions on File Prior to Visit  Medication Sig Dispense Refill  . acetaminophen (TYLENOL) 325 MG tablet Take 650 mg by  mouth every 6 (six) hours as needed for moderate pain or headache.    . albuterol (PROVENTIL HFA;VENTOLIN HFA) 108 (90 Base) MCG/ACT inhaler Inhale 2 puffs into the lungs every 4 (four) hours as needed for wheezing or shortness of breath. 1 Inhaler 2  . beclomethasone (QVAR) 80 MCG/ACT inhaler Inhale 2 puffs into the lungs 2 (two) times daily. (Patient taking differently: Inhale 1 puff into the lungs 2 (two) times daily. ) 1 Inhaler 5  . cyclobenzaprine (FLEXERIL) 10 MG tablet Take 10 mg by mouth 3 (three) times daily as needed for muscle spasms.    . fluticasone (FLONASE) 50 MCG/ACT nasal spray Place 2 sprays into both nostrils at bedtime.  16 g 5  . levocetirizine (XYZAL) 5 MG tablet Take 1 tablet (5 mg total) by mouth every evening. 30 tablet 5  . levonorgestrel (MIRENA) 20 MCG/24HR IUD 1 each by Intrauterine route once.    Marland Kitchen levothyroxine (SYNTHROID, LEVOTHROID) 50 MCG tablet Take 4 tablets (200 mcg total) by mouth daily. 180 tablet 1  . montelukast (SINGULAIR) 10 MG tablet Take 1 tablet (10 mg total) by mouth at bedtime. 30 tablet 0  . rizatriptan (MAXALT) 10 MG tablet Take 1 tablet (10 mg total) by mouth 3 (three) times daily as needed for migraine. May repeat in 2 hours if needed 10 tablet 3  . EPINEPHrine 0.3 mg/0.3 mL IJ SOAJ injection USE AS DIRECTED FOR SEVERE ALLERGIC REACTION. (Patient not taking: Reported on 11/07/2016) 2 Device 2   No current facility-administered medications on file prior to visit.    Allergies  Allergen Reactions  . Shrimp [Shellfish Allergy] Hives, Shortness Of Breath and Swelling  . Watermelon Concentrate Hives, Shortness Of Breath and Swelling  . Latex Rash   Family History  Problem Relation Age of Onset  . Hypertension Mother   . Breast cancer Maternal Grandmother   . Allergic rhinitis Father   . Asthma Father   . Migraines Brother   . Allergic rhinitis Brother   . Migraines Sister   . Angioedema Neg Hx   . Eczema Neg Hx   . Immunodeficiency Neg Hx   . Urticaria Neg Hx    PE: BP 124/80 (BP Location: Left Arm, Patient Position: Sitting)   Pulse 84   Wt 169 lb (76.7 kg)   SpO2 97%   BMI 29.47 kg/m  Wt Readings from Last 3 Encounters:  03/09/17 169 lb (76.7 kg)  11/07/16 166 lb (75.3 kg)  07/22/16 153 lb (69.4 kg)   Constitutional: overweight, in NAD Eyes: PERRLA, EOMI, no exophthalmos ENT: moist mucous membranes, no thyromegaly, no cervical lymphadenopathy Cardiovascular: RRR, No MRG Respiratory: CTA B Gastrointestinal: abdomen soft, NT, ND, BS+ Musculoskeletal: no deformities, strength intact in all 4 Skin: moist, warm, no rashes Neurological: no tremor with  outstretched hands, DTR normal in all 4  ASSESSMENT: 1. Postablative Hypothyroidism  2. HA  PLAN:  1. Patient with Long-standing uncontrolled hypothyroidism, on the rather large dose of levothyroxine. Her TFTs remain abnormal, very fluctuating, worse at last check. At previous visits, we tried Tirosint as this has better GI absorption, in an effort to normalize her TFTs. She could not tolerate this well. At last visit, we switched to Synthroid 200 mcg (50 mcg x 4 a day as this tab has no excipients) >> nausea is now resolved, but still has  fatigue, cold intolerance, headaches. - latest thyroid labs reviewed with pt >> TSH high, but we changed to Synthroid afterwards - pt feels good  on this dose. - we discussed about taking the thyroid hormone every day, with water, >30 minutes before breakfast, separated by >4 hours from acid reflux medications, calcium, iron, multivitamins. Pt. is taking it correctly, reportedly not missing doses. - will check thyroid tests today: TSH and fT4 - If labs are abnormal, she will need to return for repeat TFTs in 1.5 months - OTW, RTC in 6 mo  2. HA - this has not improved after switching to Synthroid - she wears glasses >> discussed the need to see her ophthalmologist again to see if worsening vision - she was seeig Dr Anne HahnWillis (neuro), but not anymore  Needs refills.  Office Visit on 03/09/2017  Component Date Value Ref Range Status  . TSH 03/09/2017 9.43* 0.35 - 4.50 uIU/mL Final  . Free T4 03/09/2017 0.75  0.60 - 1.60 ng/dL Final   Comment: Specimens from patients who are undergoing biotin therapy and /or ingesting biotin supplements may contain high levels of biotin.  The higher biotin concentration in these specimens interferes with this Free T4 assay.  Specimens that contain high levels  of biotin may cause false high results for this Free T4 assay.  Please interpret results in light of the total clinical presentation of the patient.     TFTs  improved >> I would like to recheck them in 2 months but not change the dose of LT4 for now. If still high then >> may need increase in dose.   Carlus Pavlovristina Anani Gu, MD PhD [facility]

## 2017-03-10 LAB — TSH: TSH: 9.43 u[IU]/mL — ABNORMAL HIGH (ref 0.35–4.50)

## 2017-03-10 LAB — T4, FREE: Free T4: 0.75 ng/dL (ref 0.60–1.60)

## 2017-03-10 MED ORDER — SYNTHROID 50 MCG PO TABS: 200.0000 ug | ORAL_TABLET | Freq: Every day | ORAL | 2 refills | Status: DC

## 2017-05-25 DIAGNOSIS — Z23 Encounter for immunization: Secondary | ICD-10-CM | POA: Diagnosis not present

## 2017-05-25 DIAGNOSIS — R079 Chest pain, unspecified: Secondary | ICD-10-CM | POA: Diagnosis not present

## 2017-09-08 ENCOUNTER — Encounter: Payer: Self-pay | Admitting: Internal Medicine

## 2017-09-08 ENCOUNTER — Ambulatory Visit: Payer: 59 | Admitting: Internal Medicine

## 2017-09-08 VITALS — BP 122/78 | HR 73 | Ht 63.0 in | Wt 166.8 lb

## 2017-09-08 DIAGNOSIS — E890 Postprocedural hypothyroidism: Secondary | ICD-10-CM

## 2017-09-08 LAB — TSH: TSH: 0.41 u[IU]/mL (ref 0.35–4.50)

## 2017-09-08 LAB — T4, FREE: Free T4: 1.71 ng/dL — ABNORMAL HIGH (ref 0.60–1.60)

## 2017-09-08 MED ORDER — SYNTHROID 50 MCG PO TABS: 200.0000 ug | ORAL_TABLET | Freq: Every day | ORAL | 3 refills | Status: DC

## 2017-09-08 NOTE — Progress Notes (Signed)
Patient ID: Lisa Day, female   DOB: August 23, 1975, 42 y.o.   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 Last visit 6 mo ago.  She was dx'ed with fibromyalgia ~ 7 mo ago >> started Cymbalta.  Reviewed history: Pt. has been dx with Crace' disease in 2006 and developed post-ablative hypothyroidism after RAI treatment >> on Levothyroxine 175 mcg >> now on Tirosint 150 mcg + added 25 mcg daily in 08/2016.   She had more nausea and migraines since starting Tirosint.   We changed to Synthroid d.a.w in an effort to minimize adverse effects from the thyroid hormone tablet.  Nausea resolved after switching to Synthroid 50 mcg tablets.  Pt is on Synthroid 200 Mcg (50 mcg x 4 tablets) daily, taken: - no skipped doses - at 6:20 am - fasting - skips b'fast - drinks coffee + cream at 7:30 am - no Ca, Fe, MVI, PPIs - not on Biotin  I reviewed pt's thyroid tests -improving at last check, but still abnormal: Lab Results  Component Value Date   TSH 9.43 (H) 03/09/2017   TSH 19.70 (H) 11/07/2016   TSH 10.06 (H) 09/19/2016   TSH 0.701 03/16/2011   FREET4 0.75 03/09/2017   FREET4 0.50 (L) 11/07/2016   FREET4 0.70 09/19/2016  08/03/2016: TSH 22.47 06/20/2016: TSH 8.52  05/03/2016: TSH 47.3, free T4 0.28, free T3 2.36 05/25/2008: TSH 0.01, free T4 4.58   Pt denies: - feeling nodules in neck - hoarseness - dysphagia - choking - SOB with lying down  No FH of thyroid cancer. No FH of thyroid cancer. No h/o radiation tx to head or neck except for RAI treatment.  No seaweed or kelp. No recent contrast studies. No herbal supplements. No Biotin use. No recent steroids use.   + H/o asthma. + HAs - still present, but not as severe.  ROS: Constitutional: no weight gain/no weight loss, + fatigue, + subjective hyperthermia, + subjective hypothermia Eyes: no blurry vision, no  xerophthalmia ENT: no sore throat, + see HPI Cardiovascular: no CP/no SOB/no palpitations/no leg swelling Respiratory: no cough/no SOB/no wheezing Gastrointestinal: no N/no V/no D/no C/no acid reflux Musculoskeletal: + muscle aches/+ joint aches Skin: no rashes, ++ hair loss (increased) Neurological: no tremors/no numbness/no tingling/no dizziness, + HA  I reviewed pt's medications, allergies, PMH, social hx, family hx, and changes were documented in the history of present illness. Otherwise, unchanged from my initial visit note.  Past Medical History:  Diagnosis Date  . Allergic rhinitis   . Anxiety and depression   . Asthma   . Fibromyalgia   . Grave's disease   . Headache 07/02/2014   Past Surgical History:  Procedure Laterality Date  . APPENDECTOMY  2001  . CESAREAN SECTION  04/20/2011   Procedure: CESAREAN SECTION;  Surgeon: Jeani Hawking, MD;  Location: [facility];  Service: Gynecology;  Laterality: N/A;  Primary Cesarean Section with Delivery Baby Boy @ [phone]  . CHOLECYSTECTOMY  2006  . HIP ARTHROSCOPY     Social History   Social History  . Marital status: Single    Spouse name: N/A  . 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 Smoking status: Never Smoker  . Smokeless tobacco: Never Used  . Alcohol use No  . Drug use: No   Current Outpatient Medications  on File Prior to Visit  Medication Sig Dispense Refill  . acetaminophen (TYLENOL) 325 MG tablet Take 650 mg by mouth every 6 (six) hours as needed for moderate pain or headache.    . albuterol (PROVENTIL HFA;VENTOLIN HFA) 108 (90 Base) MCG/ACT inhaler Inhale 2 puffs into the lungs every 4 (four) hours as needed for wheezing or shortness of breath. 1 Inhaler 2  . beclomethasone (QVAR) 80 MCG/ACT inhaler Inhale 2 puffs into the lungs 2 (two) times daily. (Patient taking differently: Inhale 1 puff into the lungs 2 (two) times daily. ) 1 Inhaler 5  . cyclobenzaprine  (FLEXERIL) 10 MG tablet Take 10 mg by mouth 3 (three) times daily as needed for muscle spasms.    . DULoxetine (CYMBALTA) 20 MG capsule 1 CAPSULE ONCE DAILY FOR 2 WEEKS, THEN TWICE DAILY AFTER THAT AS DIRECTED ORALLY 30 DAY(S)  11  . EPINEPHrine 0.3 mg/0.3 mL IJ SOAJ injection USE AS DIRECTED FOR SEVERE ALLERGIC REACTION. 2 Device 2  . fluticasone (FLONASE) 50 MCG/ACT nasal spray Place 2 sprays into both nostrils at bedtime. 16 g 5  . levocetirizine (XYZAL) 5 MG tablet Take 1 tablet (5 mg total) by mouth every evening. 30 tablet 5  . levonorgestrel (MIRENA) 20 MCG/24HR IUD 1 each by Intrauterine route once.    . montelukast (SINGULAIR) 10 MG tablet Take 1 tablet (10 mg total) by mouth at bedtime. 30 tablet 0  . rizatriptan (MAXALT) 10 MG tablet Take 1 tablet (10 mg total) by mouth 3 (three) times daily as needed for migraine. May repeat in 2 hours if needed 10 tablet 3  . SYNTHROID 50 MCG tablet Take 4 tablets (200 mcg total) by mouth daily before breakfast. 240 tablet 2   No current facility-administered medications on file prior to visit.    Allergies  Allergen Reactions  . Shrimp [Shellfish Allergy] Hives, Shortness Of Breath and Swelling  . Watermelon Concentrate Hives, Shortness Of Breath and Swelling  . Latex Rash   Family History  Problem Relation Age of Onset  . Hypertension Mother   . Breast cancer Maternal Grandmother   . Allergic rhinitis Father   . Asthma Father   . Migraines Brother   . Allergic rhinitis Brother   . Migraines Sister   . Angioedema Neg Hx   . Eczema Neg Hx   . Immunodeficiency Neg Hx   . Urticaria Neg Hx    PE: BP 122/78   Pulse 73   Ht 5\' 3"  (1.6 m)   Wt 166 lb 12.8 oz (75.7 kg)   SpO2 98%   BMI 29.55 kg/m  Wt Readings from Last 3 Encounters:  09/08/17 166 lb 12.8 oz (75.7 kg)  03/09/17 169 lb (76.7 kg)  11/07/16 166 lb (75.3 kg)   Constitutional: overweight, in NAD Eyes: PERRLA, EOMI, no exophthalmos ENT: moist mucous membranes, no  thyromegaly, no cervical lymphadenopathy Cardiovascular: RRR, No MRG Respiratory: CTA B Gastrointestinal: abdomen soft, NT, ND, BS+ Musculoskeletal: no deformities, strength intact in all 4 Skin: moist, warm, no rashes Neurological: no tremor with outstretched hands, DTR normal in all 4  ASSESSMENT: 1. Postablative Hypothyroidism  2. HA  PLAN:  1. Patient with long-standing, uncontrolled, hypothyroidism, on the rather large dose of levothyroxine.  Her TFTs have improved after switching to Synthroid 200 mcg (50 mcg x 4 tablets a day as this tab has no excipients), that but they were still high at last check. - After switching to Synthroid, her nausea resolved but continues to  be symptomatic with fatigue, cold intolerance, headaches. - Of note, we tried to use Tirosint for better GI absorption in an effort to normalize her TFTs but she could not tolerate this well. - she continues on Synthroid 200 daily - we discussed about taking the thyroid hormone every day, with water, >30 minutes before breakfast, separated by >4 hours from acid reflux medications, calcium, iron, multivitamins. Pt. is taking it correctly - will check thyroid tests today: TSH and fT4 - If labs are abnormal, she will need to return for repeat TFTs in 1.5 months - OTW, RTC in 6 mo  Needs refills.  - time spent with the patient: 15 min, of which >50% was spent in obtaining information about her symptoms, reviewing her previous labs, evaluations, and treatments, counseling her about her condition (please see the discussed topics above), and developing a plan to further investigate it.  Component     Latest Ref Rng & Units 09/08/2017  T4,Free(Direct)     0.60 - 1.60 ng/dL 4.09 (H)  TSH     8.11 - 4.50 uIU/mL 0.41   Normal TSH. fT4 slightly high >> will repeat TFTs in 2 more months to make sure TSH does not decrease further, but no change in dose for now.  Carlus Pavlov, MD PhD [facility] Endocrinology

## 2017-09-08 NOTE — Patient Instructions (Signed)
Please continue Synthroid 200 mcg daily.  Take the thyroid hormone every day, with water, at least 30 minutes before breakfast, separated by at least 4 hours from: - acid reflux medications - calcium - iron - multivitamins  Please stop at the lab.  Please come back for a follow-up appointment in 6 months.

## 2017-10-04 DIAGNOSIS — Z01419 Encounter for gynecological examination (general) (routine) without abnormal findings: Secondary | ICD-10-CM | POA: Diagnosis not present

## 2017-10-04 DIAGNOSIS — Z803 Family history of malignant neoplasm of breast: Secondary | ICD-10-CM | POA: Diagnosis not present

## 2017-10-04 DIAGNOSIS — Z6829 Body mass index (BMI) 29.0-29.9, adult: Secondary | ICD-10-CM | POA: Diagnosis not present

## 2017-10-04 DIAGNOSIS — N390 Urinary tract infection, site not specified: Secondary | ICD-10-CM | POA: Diagnosis not present

## 2017-10-04 DIAGNOSIS — Z801 Family history of malignant neoplasm of trachea, bronchus and lung: Secondary | ICD-10-CM | POA: Diagnosis not present

## 2017-10-18 DIAGNOSIS — J329 Chronic sinusitis, unspecified: Secondary | ICD-10-CM | POA: Diagnosis not present

## 2017-11-11 IMAGING — CR DG ANKLE COMPLETE 3+V*L*
3 series · 3 of 3 positions shown · non-contrast
Comparison: None.

CLINICAL DATA: Left ankle pain after injury today.

EXAM:
LEFT ANKLE COMPLETE - 3+ VIEW

[x ankle ap left]
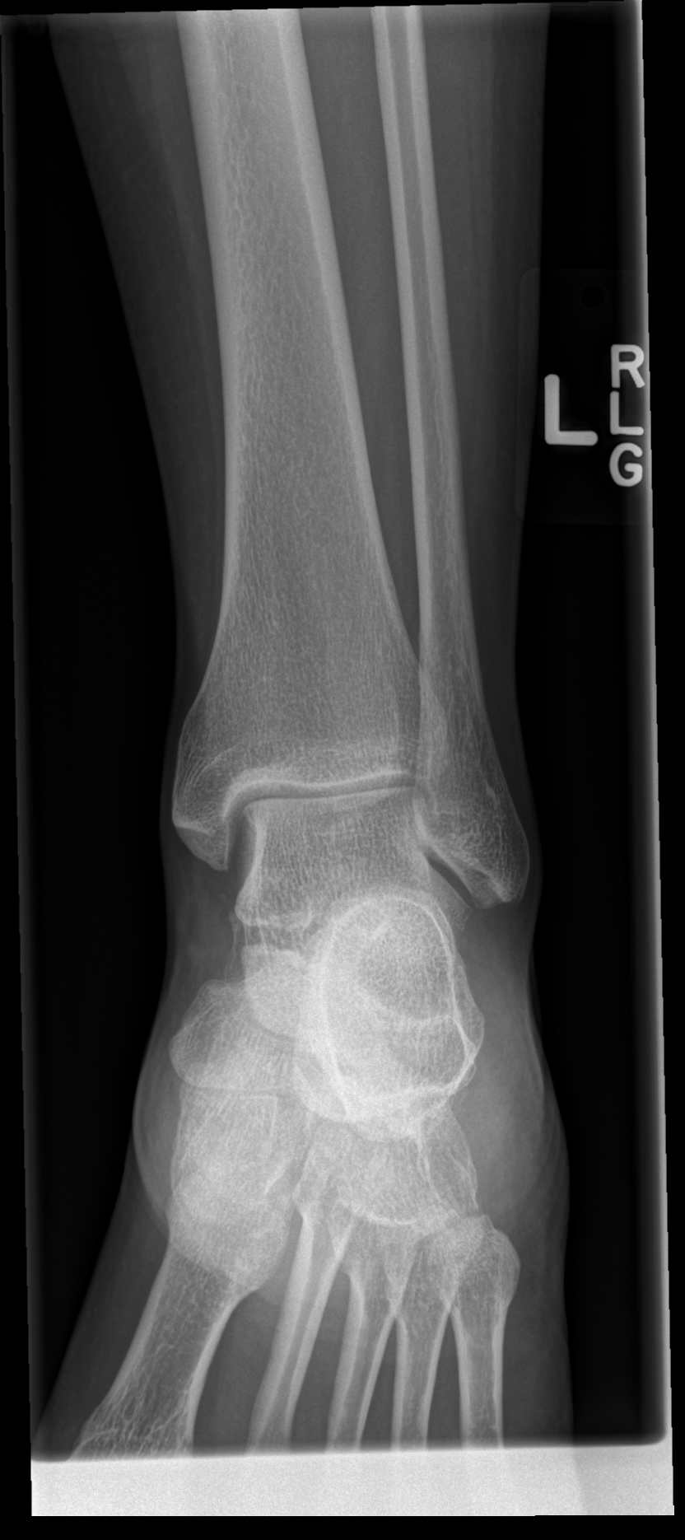

[x ankle obl left]
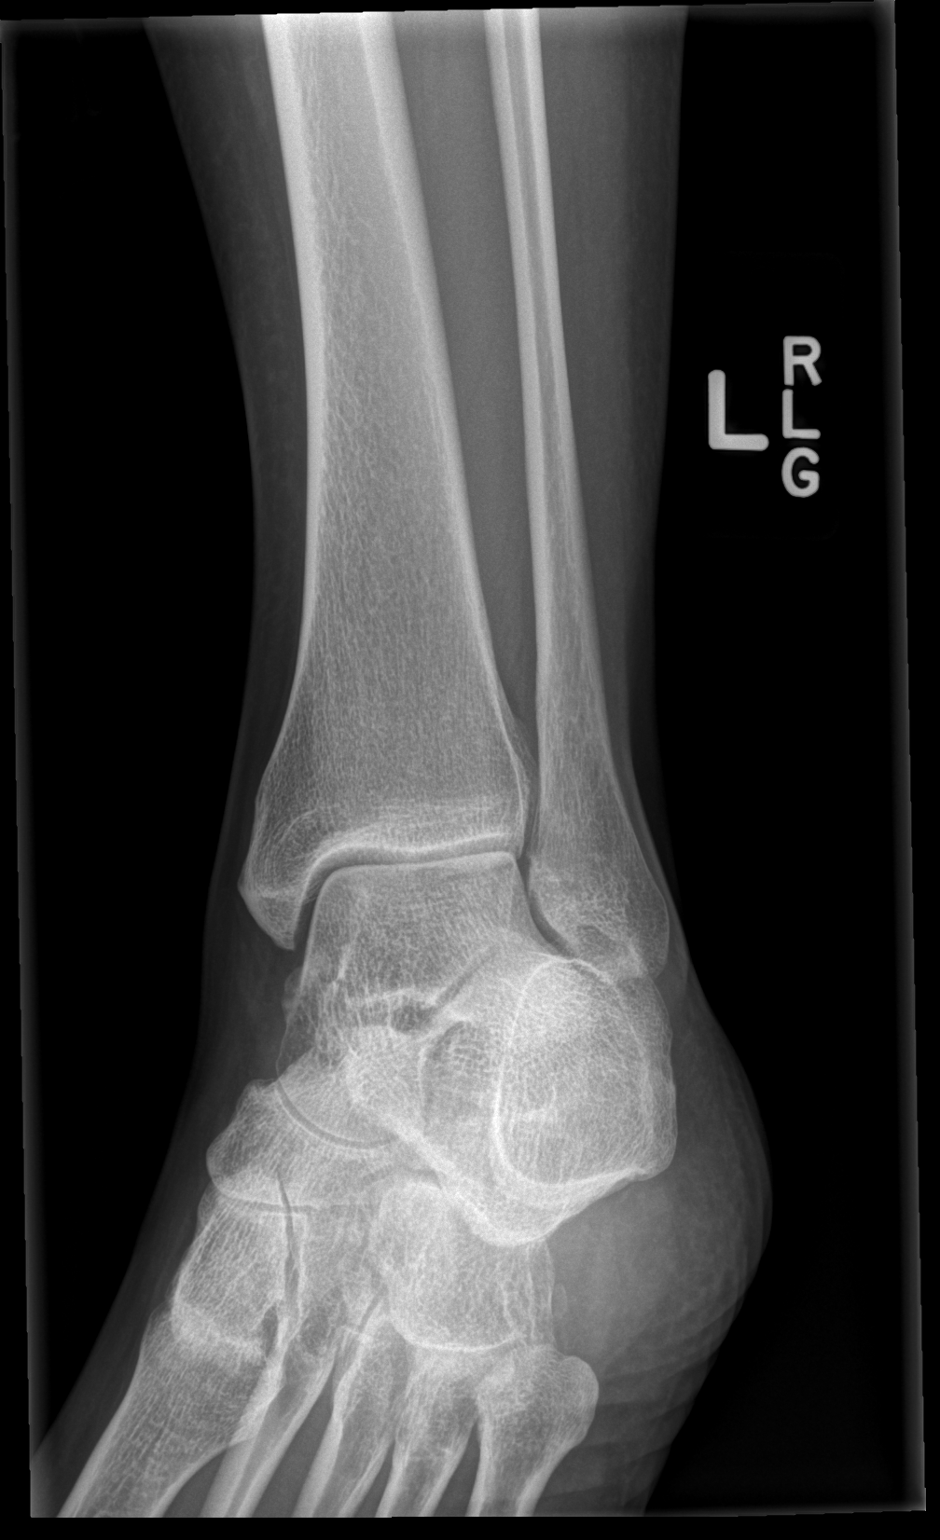

[x ankle lat left]
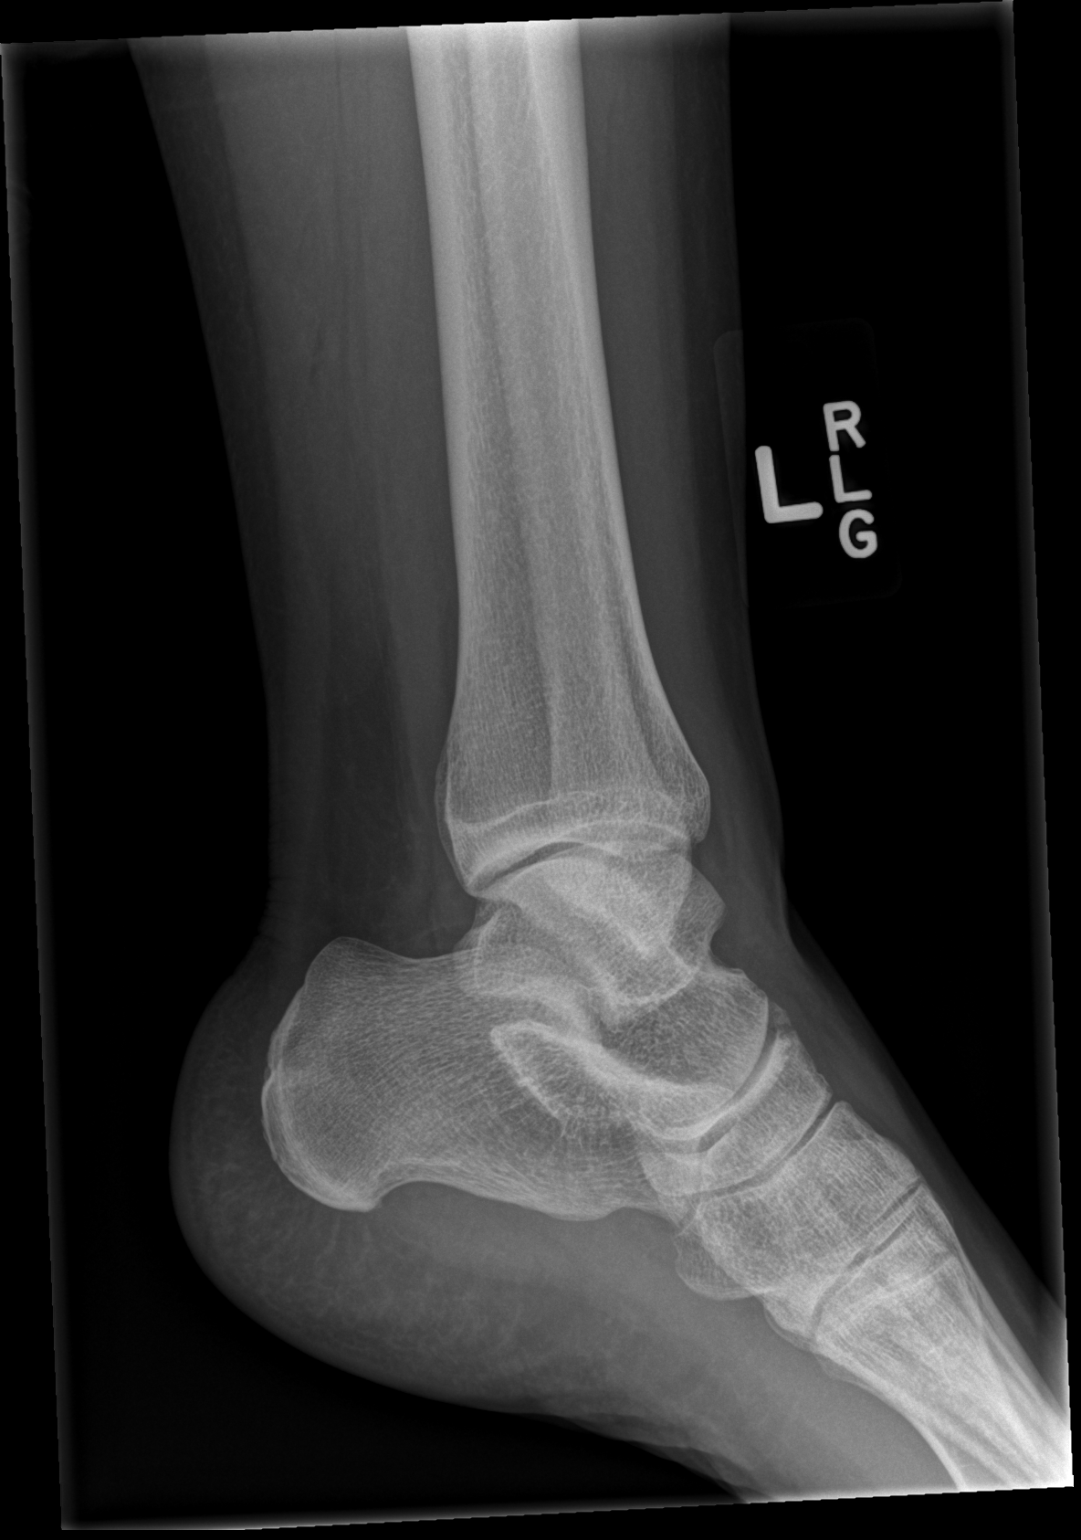

[3 of 3 positions shown; findings below may reference images not displayed]

FINDINGS: There is no evidence of fracture, dislocation, or joint effusion.
There is no evidence of arthropathy or other focal bone abnormality.
Soft tissues are unremarkable.
IMPRESSION: Normal left ankle.

## 2017-11-21 DIAGNOSIS — Z1231 Encounter for screening mammogram for malignant neoplasm of breast: Secondary | ICD-10-CM | POA: Diagnosis not present

## 2017-11-21 DIAGNOSIS — Z809 Family history of malignant neoplasm, unspecified: Secondary | ICD-10-CM | POA: Diagnosis not present

## 2018-01-19 DIAGNOSIS — Z0000 Encounter for general adult medical examination without abnormal findings: Secondary | ICD-10-CM | POA: Diagnosis not present

## 2018-01-19 DIAGNOSIS — Z5181 Encounter for therapeutic drug level monitoring: Secondary | ICD-10-CM | POA: Diagnosis not present

## 2018-01-19 DIAGNOSIS — Z13220 Encounter for screening for lipoid disorders: Secondary | ICD-10-CM | POA: Diagnosis not present

## 2018-03-08 ENCOUNTER — Ambulatory Visit: Payer: 59 | Admitting: Internal Medicine

## 2018-03-08 DIAGNOSIS — Z0289 Encounter for other administrative examinations: Secondary | ICD-10-CM

## 2018-06-04 DIAGNOSIS — E611 Iron deficiency: Secondary | ICD-10-CM | POA: Diagnosis not present

## 2018-06-04 DIAGNOSIS — Z23 Encounter for immunization: Secondary | ICD-10-CM | POA: Diagnosis not present

## 2018-06-04 DIAGNOSIS — R5382 Chronic fatigue, unspecified: Secondary | ICD-10-CM | POA: Diagnosis not present

## 2018-06-04 DIAGNOSIS — E538 Deficiency of other specified B group vitamins: Secondary | ICD-10-CM | POA: Diagnosis not present

## 2018-06-04 DIAGNOSIS — G5601 Carpal tunnel syndrome, right upper limb: Secondary | ICD-10-CM | POA: Diagnosis not present

## 2018-06-04 LAB — TSH: TSH: 12.63 — AB (ref <=5.90)

## 2018-06-05 ENCOUNTER — Encounter: Payer: Self-pay | Admitting: Internal Medicine

## 2018-06-08 DIAGNOSIS — E538 Deficiency of other specified B group vitamins: Secondary | ICD-10-CM | POA: Diagnosis not present

## 2018-06-21 ENCOUNTER — Encounter: Payer: Self-pay | Admitting: Neurology

## 2018-08-22 HISTORY — PX: BREAST BIOPSY: SHX20

## 2018-09-06 NOTE — Progress Notes (Signed)
[facility] Note - Initial Visit   Date: 09/07/18  Lisa Day MRN: [id number] DOB: May 29, 1976   Dear Dr. Justin Mend:  Thank you for your kind referral of Lisa Day for consultation of right arm and leg paresthesias. Although her history is well known to you, please allow [place] to reiterate it for the purpose of our medical record. The patient was accompanied to the clinic by self.    History of Present Illness: Lisa Day is a 43 y.o. right-handed African American female with Sarff' disease, fibromyalgia, migraine, and asthma presenting for evaluation of generalized paresthesias.    Starting around October 2019, she began having sensation of hands falling asleep, worse on the right and at nighttime.  She complains of pulsating feeling in the entire right arm and right leg. It occurs intermittently throughout the day.  No specific triggers.  It is worse when she is cold.  Nothing that alleviates her symptoms.  She also complains of numbness over the calf and entire foot.  She has generalized weakness of the arms.  No falls.  She also started having leg cramps. She is vegetarian and found to have low vitamin B12. She takes OTC vitamin B12 daily.   She has previously been evaluated at the [facility] and [facility] for headaches.  She had been on Botox, topiramate, and tizanidine.  CT head, MRI cervical spine, and MRI lumbar spine in the past has essentially been normal.   Out-side paper records, electronic medical record, and images have been reviewed where available and summarized as:  MRI cervical spine wwo contrast 10/21/2014:  This is a mildly abnormal MRI of the cervical spine showing mild C4-C5 and minimal C5-C6 degenerative changes as detailed above. There does not appear to be nerve root impingement at any of the cervical levels.  MRI lumbar spine wo contrast 02/08/2015: Negative examination.  No finding to explain the patient's  symptoms.  CT head 04/27/2014:  Negative  Past Medical History:  Diagnosis Date  . Allergic rhinitis   . Anxiety and depression   . Asthma   . Fibromyalgia   . Grave's disease   . Headache 07/02/2014    Past Surgical History:  Procedure Laterality Date  . APPENDECTOMY  2001  . CESAREAN SECTION  04/20/2011   Procedure: CESAREAN SECTION;  Surgeon: Cyril Mourning, MD;  Location: [facility];  Service: Gynecology;  Laterality: N/A;  Primary Cesarean Section with Delivery Baby Boy @ [phone]  . CHOLECYSTECTOMY  2006  . HIP ARTHROSCOPY       Medications:  Outpatient Encounter Medications as of 09/07/2018  Medication Sig Note  . acetaminophen (TYLENOL) 325 MG tablet Take 650 mg by mouth every 6 (six) hours as needed for moderate pain or headache.   . albuterol (PROVENTIL HFA;VENTOLIN HFA) 108 (90 Base) MCG/ACT inhaler Inhale 2 puffs into the lungs every 4 (four) hours as needed for wheezing or shortness of breath.   . beclomethasone (QVAR) 80 MCG/ACT inhaler Inhale 2 puffs into the lungs 2 (two) times daily. (Patient taking differently: Inhale 1 puff into the lungs 2 (two) times daily. )   . cyclobenzaprine (FLEXERIL) 10 MG tablet Take 10 mg by mouth 3 (three) times daily as needed for muscle spasms.   . DULoxetine (CYMBALTA) 20 MG capsule 1 CAPSULE ONCE DAILY FOR 2 WEEKS, THEN TWICE DAILY AFTER THAT AS DIRECTED ORALLY 30 DAY(S)   . EPINEPHrine 0.3 mg/0.3 mL IJ SOAJ injection USE AS DIRECTED FOR  SEVERE ALLERGIC REACTION.   . fluticasone (FLONASE) 50 MCG/ACT nasal spray Place 2 sprays into both nostrils at bedtime.   Lisa Day Kitchen levocetirizine (XYZAL) 5 MG tablet Take 1 tablet (5 mg total) by mouth every evening.   Lisa Day Kitchen levonorgestrel (MIRENA) 20 MCG/24HR IUD 1 each by Intrauterine route once. 07/22/2016: Got placed in oct 2017  . montelukast (SINGULAIR) 10 MG tablet Take 1 tablet (10 mg total) by mouth at bedtime.   Lisa Day Kitchen SYNTHROID 50 MCG tablet Take 4 tablets (200 mcg total) by mouth daily before breakfast.   .  [DISCONTINUED] rizatriptan (MAXALT) 10 MG tablet Take 1 tablet (10 mg total) by mouth 3 (three) times daily as needed for migraine. May repeat in 2 hours if needed    No facility-administered encounter medications on file as of 09/07/2018.      Allergies:  Allergies  Allergen Reactions  . Shrimp [Shellfish Allergy] Hives, Shortness Of Breath and Swelling  . Watermelon Concentrate Hives, Shortness Of Breath and Swelling  . Latex Rash    Family History: Family History  Problem Relation Age of Onset  . Hypertension Mother   . Breast cancer Maternal Grandmother   . Allergic rhinitis Father   . Asthma Father   . Migraines Brother   . Allergic rhinitis Brother   . Migraines Sister   . Angioedema Neg Hx   . Eczema Neg Hx   . Immunodeficiency Neg Hx   . Urticaria Neg Hx     Social History: Social History   Tobacco Use  . Smoking status: Never Smoker  . Smokeless tobacco: Never Used  Substance Use Topics  . Alcohol use: No    Alcohol/week: 0.0 standard drinks  . Drug use: No   Social History   Social History Narrative   Lives with boyfriend and 2 children in a 2 story home.  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      Review of Systems:  CONSTITUTIONAL: No fevers, chills, night sweats, or weight loss.   EYES: No visual changes or eye pain ENT: No hearing changes.  No history of nose bleeds.   RESPIRATORY: No cough, wheezing and shortness of breath.   CARDIOVASCULAR: Negative for chest pain, and palpitations.   GI: Negative for abdominal discomfort, blood in stools or black stools.  No recent change in bowel habits.   GU:  No history of incontinence.   MUSCLOSKELETAL: No history of joint pain or swelling.  +myalgias.   SKIN: Negative for lesions, rash, and itching.   HEMATOLOGY/ONCOLOGY: Negative for prolonged bleeding, bruising easily, and swollen nodes.  No history of cancer.   ENDOCRINE: Negative for cold or heat intolerance, polydipsia or goiter.   PSYCH:  No  depression or anxiety symptoms.   NEURO: As Above.   Vital Signs:  BP 100/70   Pulse 98   Ht '5\' 3"'$  (1.6 m)   Wt 158 lb 4 oz (71.8 kg)   SpO2 99%   BMI 28.03 kg/m    General Medical Exam:   General:  Well appearing, comfortable.   Eyes/ENT: see cranial nerve examination.   Neck: No masses appreciated.  Full range of motion without tenderness.  No carotid bruits. Respiratory:  Clear to auscultation, good air entry bilaterally.   Cardiac:  Regular rate and rhythm, no murmur.   Extremities:  No deformities, edema, or skin discoloration.  Skin:  No rashes or lesions.  Neurological Exam: MENTAL STATUS including orientation to time, place, person, recent and remote memory, attention span  and concentration, language, and fund of knowledge is normal.  Speech is not dysarthric.  CRANIAL NERVES: II:  No visual field defects.  Unremarkable fundi.   III-IV-VI: Pupils equal round and reactive to light.  Normal conjugate, extra-ocular eye movements in all directions of gaze.  No nystagmus.  No ptosis.   V:  Normal facial sensation.   VII:  Normal facial symmetry and movements.  No pathologic facial reflexes.  VIII:  Normal hearing and vestibular function.   IX-X:  Normal palatal movement.   XI:  Normal shoulder shrug and head rotation.   XII:  Normal tongue strength and range of motion, no deviation or fasciculation.  MOTOR:  No atrophy, fasciculations or abnormal movements.  No pronator drift.  Tone is normal.    Right Upper Extremity:    Left Upper Extremity:    Deltoid  5/5   Deltoid  5/5   Biceps  5/5   Biceps  5/5   Triceps  5/5   Triceps  5/5   Wrist extensors  5/5   Wrist extensors  5/5   Wrist flexors  5/5   Wrist flexors  5/5   Finger extensors  5/5   Finger extensors  5/5   Finger flexors  5/5   Finger flexors  5/5   Dorsal interossei  5/5   Dorsal interossei  5/5   Abductor pollicis  5/5   Abductor pollicis  5/5   Tone (Ashworth scale)  0  Tone (Ashworth scale)  0   Right  Lower Extremity:    Left Lower Extremity:    Hip flexors  5/5   Hip flexors  5/5   Hip extensors  5/5   Hip extensors  5/5   Knee flexors  5/5   Knee flexors  5/5   Knee extensors  5/5   Knee extensors  5/5   Dorsiflexors  5/5   Dorsiflexors  5/5   Plantarflexors  5/5   Plantarflexors  5/5   Toe extensors  5/5   Toe extensors  5/5   Toe flexors  5/5   Toe flexors  5/5   Tone (Ashworth scale)  0  Tone (Ashworth scale)  0   MSRs:  Reflexes are brisk and symmetric throughout (3+/4).  Plantars are down going.  SENSORY:  Normal and symmetric perception of light touch, pinprick, vibration, and proprioception.  Romberg's sign absent.   COORDINATION/GAIT: Normal finger-to- nose-finger and heel-to-shin.  Intact rapid alternating movements bilaterally.  Able to rise from a chair without using arms.  Gait narrow based and stable. Tandem and stressed gait intact.    IMPRESSION: Migratory paresthesias, worse over the right arm and leg.  Her neurological exam shows symmetric and brisk reflexes which in the absence of other associated UMN findings, is most likely normal for patient.  With her lateralizing and myriad of symptoms, MRI brain wwo contrast will be ordered to be sure demyelinating disease has been evaluated for, however my overall suspicion is low.  I will also check TSH, vitamin B12, vitamin B1, folate, copper.  Further recommendations based on these results.     Thank you for allowing me to participate in patient's care.  If I can answer any additional questions, I would be pleased to do so.    Sincerely,    Donika K. Posey Pronto, DO

## 2018-09-07 ENCOUNTER — Ambulatory Visit: Payer: 59 | Admitting: Neurology

## 2018-09-07 ENCOUNTER — Other Ambulatory Visit (INDEPENDENT_AMBULATORY_CARE_PROVIDER_SITE_OTHER): Payer: 59

## 2018-09-07 ENCOUNTER — Encounter: Payer: Self-pay | Admitting: Neurology

## 2018-09-07 VITALS — BP 100/70 | HR 98 | Ht 63.0 in | Wt 158.2 lb

## 2018-09-07 DIAGNOSIS — R202 Paresthesia of skin: Secondary | ICD-10-CM

## 2018-09-07 DIAGNOSIS — R292 Abnormal reflex: Secondary | ICD-10-CM

## 2018-09-07 LAB — FOLATE: FOLATE: 11.2 ng/mL (ref >5.9)

## 2018-09-07 LAB — TSH: TSH: 0.03 u[IU]/mL — AB (ref 0.35–4.50)

## 2018-09-07 LAB — VITAMIN B12: Vitamin B-12: 155 pg/mL — ABNORMAL LOW (ref 211–911)

## 2018-09-07 NOTE — Patient Instructions (Signed)
MRI brain wwo contrast  Check labs

## 2018-09-10 LAB — COPPER, SERUM: Copper: 107 ug/dL (ref 70–175)

## 2018-09-10 LAB — VITAMIN B1: Vitamin B1 (Thiamine): <6 nmol/L — ABNORMAL LOW (ref 8–30)

## 2018-09-11 ENCOUNTER — Telehealth: Payer: Self-pay | Admitting: *Deleted

## 2018-09-11 NOTE — Telephone Encounter (Signed)
Patient given results and she will come by to have T3 and T4 labs done.

## 2018-09-11 NOTE — Telephone Encounter (Signed)
-----   Message from Glendale Chard, DO sent at 09/10/2018  9:04 AM EST ----- Can we add free T3 and free T4 to her labs?

## 2018-09-14 ENCOUNTER — Other Ambulatory Visit (INDEPENDENT_AMBULATORY_CARE_PROVIDER_SITE_OTHER): Payer: 59

## 2018-09-14 ENCOUNTER — Telehealth: Payer: Self-pay | Admitting: *Deleted

## 2018-09-14 ENCOUNTER — Other Ambulatory Visit: Payer: Self-pay | Admitting: *Deleted

## 2018-09-14 DIAGNOSIS — R7989 Other specified abnormal findings of blood chemistry: Secondary | ICD-10-CM

## 2018-09-14 LAB — T3, FREE: T3, Free: 4 pg/mL (ref 2.3–4.2)

## 2018-09-14 LAB — T4, FREE: Free T4: 1.58 ng/dL (ref 0.60–1.60)

## 2018-09-14 MED ORDER — CYANOCOBALAMIN 1000 MCG/ML IJ SOLN: INTRAMUSCULAR | 0 refills | Status: DC

## 2018-09-14 MED ORDER — "SYRINGE 25G X 1"" 3 ML MISC": 1.0000 | 0 refills | Status: DC

## 2018-09-14 NOTE — Telephone Encounter (Signed)
-----   Message from Glendale Chard, DO sent at 09/12/2018  8:59 AM EST ----- Please inform patient that her vitamin B1 and B12 is very low.  Start vitamin B1 100mg  daily and Vitamin B12 IM injection daily x 7 days, weekly x 4 weeks, then monthly thereafter x 1 year.  Also, can we add free T3 and T4 to her labs?  Thanks.

## 2018-09-14 NOTE — Telephone Encounter (Signed)
Patient here for lab work.  Patient given B12 and B1 results and instructions.  She will give her own injections.   Rx to be called in.

## 2018-09-17 ENCOUNTER — Encounter: Payer: Self-pay | Admitting: *Deleted

## 2018-09-17 ENCOUNTER — Telehealth: Payer: Self-pay | Admitting: *Deleted

## 2018-09-17 NOTE — Telephone Encounter (Signed)
Results sent via My Chart.  

## 2018-09-17 NOTE — Telephone Encounter (Signed)
-----   Message from Glendale Chard, DO sent at 09/14/2018  4:47 PM EST ----- Please inform patient thyroid levels are normal. Thanks.

## 2018-09-19 ENCOUNTER — Other Ambulatory Visit: Payer: Self-pay | Admitting: Internal Medicine

## 2018-09-19 NOTE — Telephone Encounter (Signed)
Lost for f/u with me - further refills per PCP. Also, she did not read the last msg I sent her through MyChart Re: abnormal TSH.Marland KitchenMarland Kitchen

## 2018-09-19 NOTE — Telephone Encounter (Signed)
Last OV 09/08/17  no-showed for next appointment- no future appointment scheduled refill or refuse please advise

## 2018-09-25 ENCOUNTER — Ambulatory Visit
Admission: RE | Admit: 2018-09-25 | Discharge: 2018-09-25 | Disposition: A | Discharge status: Home / Self Care | Payer: 59 | Source: Ambulatory Visit | Attending: Neurology | Admitting: Neurology

## 2018-09-25 ENCOUNTER — Encounter: Payer: Self-pay | Admitting: *Deleted

## 2018-09-25 ENCOUNTER — Telehealth: Payer: Self-pay | Admitting: *Deleted

## 2018-09-25 DIAGNOSIS — R202 Paresthesia of skin: Secondary | ICD-10-CM

## 2018-09-25 DIAGNOSIS — R292 Abnormal reflex: Secondary | ICD-10-CM

## 2018-09-25 MED ORDER — GADOBENATE DIMEGLUMINE 529 MG/ML IV SOLN
15.0000 mL | Freq: Once | INTRAVENOUS | Status: AC | PRN
  Administered 2018-09-25: 15 mL via INTRAVENOUS

## 2018-09-25 NOTE — Telephone Encounter (Signed)
Results sent via My Chart.  

## 2018-09-25 NOTE — Telephone Encounter (Signed)
-----   Message from Glendale Chard, DO sent at 09/25/2018 12:32 PM EST ----- Please inform patient that her MRI brain is normal.  No evidence of anything worrisome such as stroke, multiple sclerosis, or tumor.  Symptoms are most likely due to vitamin B1 and B12 deficiency and should improve with time and supplementation.

## 2018-09-26 ENCOUNTER — Telehealth: Payer: Self-pay

## 2018-09-26 NOTE — Telephone Encounter (Signed)
Refill request for thyroid medication, patient last seen Jan 2019. Does not have appt scheduled.  Please advise.

## 2018-09-26 NOTE — Telephone Encounter (Signed)
Ok for 3 mo, but she needs an appt within this time frame

## 2018-09-27 MED ORDER — SYNTHROID 50 MCG PO TABS: 200.0000 ug | ORAL_TABLET | Freq: Every day | ORAL | 0 refills | Status: DC

## 2018-10-15 DIAGNOSIS — Z01419 Encounter for gynecological examination (general) (routine) without abnormal findings: Secondary | ICD-10-CM | POA: Diagnosis not present

## 2018-10-15 DIAGNOSIS — Z6827 Body mass index (BMI) 27.0-27.9, adult: Secondary | ICD-10-CM | POA: Diagnosis not present

## 2018-11-26 DIAGNOSIS — Z1231 Encounter for screening mammogram for malignant neoplasm of breast: Secondary | ICD-10-CM | POA: Diagnosis not present

## 2018-11-27 ENCOUNTER — Other Ambulatory Visit: Payer: Self-pay | Admitting: Obstetrics and Gynecology

## 2018-11-27 DIAGNOSIS — R928 Other abnormal and inconclusive findings on diagnostic imaging of breast: Secondary | ICD-10-CM

## 2018-12-06 ENCOUNTER — Ambulatory Visit
Admission: RE | Admit: 2018-12-06 | Discharge: 2018-12-06 | Disposition: A | Discharge status: Home / Self Care | Payer: 59 | Source: Ambulatory Visit | Attending: Obstetrics and Gynecology | Admitting: Obstetrics and Gynecology

## 2018-12-06 ENCOUNTER — Other Ambulatory Visit: Payer: Self-pay

## 2018-12-06 ENCOUNTER — Other Ambulatory Visit: Payer: Self-pay | Admitting: Obstetrics and Gynecology

## 2018-12-06 DIAGNOSIS — R921 Mammographic calcification found on diagnostic imaging of breast: Secondary | ICD-10-CM | POA: Diagnosis not present

## 2018-12-06 DIAGNOSIS — R928 Other abnormal and inconclusive findings on diagnostic imaging of breast: Secondary | ICD-10-CM

## 2018-12-14 ENCOUNTER — Ambulatory Visit
Admission: RE | Admit: 2018-12-14 | Discharge: 2018-12-14 | Disposition: A | Discharge status: Home / Self Care | Payer: 59 | Source: Ambulatory Visit | Attending: Obstetrics and Gynecology | Admitting: Obstetrics and Gynecology

## 2018-12-14 ENCOUNTER — Other Ambulatory Visit: Payer: Self-pay

## 2018-12-14 DIAGNOSIS — N6012 Diffuse cystic mastopathy of left breast: Secondary | ICD-10-CM | POA: Diagnosis not present

## 2018-12-14 DIAGNOSIS — R921 Mammographic calcification found on diagnostic imaging of breast: Secondary | ICD-10-CM

## 2018-12-14 DIAGNOSIS — N6314 Unspecified lump in the right breast, lower inner quadrant: Secondary | ICD-10-CM | POA: Diagnosis not present

## 2019-07-04 ENCOUNTER — Other Ambulatory Visit: Payer: Self-pay | Admitting: Internal Medicine

## 2019-09-13 ENCOUNTER — Other Ambulatory Visit: Payer: Self-pay

## 2019-09-17 ENCOUNTER — Ambulatory Visit: Payer: 59 | Admitting: Internal Medicine

## 2019-09-17 ENCOUNTER — Other Ambulatory Visit: Payer: Self-pay

## 2019-09-17 ENCOUNTER — Other Ambulatory Visit: Payer: Self-pay | Admitting: Internal Medicine

## 2019-09-17 ENCOUNTER — Encounter: Payer: Self-pay | Admitting: Internal Medicine

## 2019-09-17 VITALS — BP 118/60 | HR 85 | Ht 63.0 in | Wt 169.0 lb

## 2019-09-17 DIAGNOSIS — E890 Postprocedural hypothyroidism: Secondary | ICD-10-CM

## 2019-09-17 LAB — TSH: TSH: 80.34 u[IU]/mL — ABNORMAL HIGH (ref 0.35–4.50)

## 2019-09-17 LAB — T4, FREE: Free T4: 0 ng/dL — ABNORMAL LOW (ref 0.60–1.60)

## 2019-09-17 LAB — T3, FREE: T3, Free: 1.9 pg/mL — ABNORMAL LOW (ref 2.3–4.2)

## 2019-09-17 MED ORDER — SYNTHROID 50 MCG PO TABS: 150.0000 ug | ORAL_TABLET | Freq: Every day | ORAL | 3 refills | Status: DC

## 2019-09-17 NOTE — Patient Instructions (Signed)
I will call in the Levothyroxine after the results return.  Take the thyroid hormone every day, with water, at least 30 minutes before breakfast, separated by at least 4 hours from: - acid reflux medications - calcium - iron - multivitamins  Please stop at the lab.  Please come back for a follow-up appointment in 6 months.

## 2019-09-17 NOTE — Progress Notes (Signed)
Patient ID: [name], female   DOB: [birth year], 44 y.o.   MRN: [id number]   This visit occurred during the SARS-CoV-2 public health emergency.  Safety protocols were in place, including screening questions prior to the visit, additional usage of staff PPE, and extensive cleaning of exam room while observing appropriate contact time as indicated for disinfecting solutions.   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 Last visit 2 years ago.  Unfortunately, since she was not seen in clinic, she could not refill her levothyroxine prescription and she is now off levothyroxine since 06/2019!!!  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   She had more nausea and migraines since starting Tirosint.   We changed to Synthroid d.a.w in an effort to minimize adverse effects from the thyroid hormone tablet.  Her nausea resolved after switching to Synthroid 50 mcg tablets.  Previously, she was taking 200 mcg of Synthroid: - in am - fasting - at least 30 mfrom b'fast - no Ca, Fe, MVI, PPIs - not on Biotin  Reviewed her TFTs: Lab Results  Component Value Date   TSH 0.03 (L) 09/07/2018   TSH 12.63 (A) 06/04/2018   TSH 0.41 09/08/2017   TSH 9.43 (H) 03/09/2017   TSH 19.70 (H) 11/07/2016   TSH 10.06 (H) 09/19/2016   TSH 0.701 03/16/2011   FREET4 1.58 09/14/2018   FREET4 1.71 (H) 09/08/2017   FREET4 0.75 03/09/2017   FREET4 0.50 (L) 11/07/2016   FREET4 0.70 09/19/2016  08/03/2016: TSH 22.47 06/20/2016: TSH 8.52  05/03/2016: TSH 47.3, free T4 0.28, free T3 2.36 05/25/2008: TSH 0.01, free T4 4.58   Pt denies: - feeling nodules in neck - hoarseness - dysphagia - choking - SOB with lying down Feels pressure in  neck.  No FH of thyroid cancer. No FH of thyroid cancer. No h/o radiation tx to head or neck, except for RAI treatment.  No seaweed or kelp. No recent contrast studies. No herbal supplements. No Biotin use. No recent steroids use.   She has a history of asthma and also headaches, fibromyalgia-previously on Cymbalta, amitryptiline.   On B12 injections.  ROS: Constitutional: + Weight gain/no weight loss, + fatigue, no subjective hyperthermia, no subjective hypothermia, + increased urination Eyes: no blurry vision, no xerophthalmia ENT: no sore throat, + see HPI Cardiovascular: no CP/no SOB/no palpitations/no leg swelling Respiratory: no cough/no SOB/no wheezing Gastrointestinal: no N/no V/no D/no C/no acid reflux Musculoskeletal: no muscle aches/no joint aches Skin: no rashes, + hair loss, + dry skin Neurological: no tremors/no numbness/no tingling/no dizziness, + HA  I reviewed pt's medications, allergies, PMH, social hx, family hx, and changes were documented in the history of present illness. Otherwise, unchanged from my initial visit note.  Past Medical History:  Diagnosis Date  . Allergic rhinitis   . Anxiety and depression   . Asthma   . Fibromyalgia   . Grave's disease   . Headache 07/02/2014   Past Surgical History:  Procedure Laterality Date  . APPENDECTOMY  2001  . CESAREAN SECTION  04/20/2011   Procedure: CESAREAN SECTION;  Surgeon: Cyril Mourning, MD;  Location: [facility];  Service: Gynecology;  Laterality: N/A;  Primary Cesarean Section with Delivery Baby Boy @ [phone]  .  CHOLECYSTECTOMY  2006  . HIP ARTHROSCOPY     Social History   Social History  . Marital status: Single    Spouse name: N/A  . 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 Smoking status: Never Smoker  . Smokeless tobacco: Never Used  . Alcohol use No  . Drug use: No   Current Outpatient Medications on File Prior to Visit  Medication Sig  Dispense Refill  . acetaminophen (TYLENOL) 325 MG tablet Take 650 mg by mouth every 6 (six) hours as needed for moderate pain or headache.    . albuterol (PROVENTIL HFA;VENTOLIN HFA) 108 (90 Base) MCG/ACT inhaler Inhale 2 puffs into the lungs every 4 (four) hours as needed for wheezing or shortness of breath. 1 Inhaler 2  . beclomethasone (QVAR) 80 MCG/ACT inhaler Inhale 2 puffs into the lungs 2 (two) times daily. (Patient taking differently: Inhale 1 puff into the lungs 2 (two) times daily. ) 1 Inhaler 5  . cyanocobalamin (,VITAMIN B-12,) 1000 MCG/ML injection 1 ml IM daily x 7 days then 1 ml IM weekly x 4 weeks then 1 ml monthly x 1 year 30 mL 0  . cyclobenzaprine (FLEXERIL) 10 MG tablet Take 10 mg by mouth 3 (three) times daily as needed for muscle spasms.    . DULoxetine (CYMBALTA) 20 MG capsule 1 CAPSULE ONCE DAILY FOR 2 WEEKS, THEN TWICE DAILY AFTER THAT AS DIRECTED ORALLY 30 DAY(S)  11  . EPINEPHrine 0.3 mg/0.3 mL IJ SOAJ injection USE AS DIRECTED FOR SEVERE ALLERGIC REACTION. 2 Device 2  . fluticasone (FLONASE) 50 MCG/ACT nasal spray Place 2 sprays into both nostrils at bedtime. 16 g 5  . levocetirizine (XYZAL) 5 MG tablet Take 1 tablet (5 mg total) by mouth every evening. 30 tablet 5  . levonorgestrel (MIRENA) 20 MCG/24HR IUD 1 each by Intrauterine route once.    . montelukast (SINGULAIR) 10 MG tablet Take 1 tablet (10 mg total) by mouth at bedtime. 30 tablet 0  . SYNTHROID 50 MCG tablet Take 4 tablets (200 mcg total) by mouth daily before breakfast. Office visit needed for future refills. 240 tablet 0  . Syringe/Needle, Disp, (SYRINGE 3CC/25GX1") 25G X 1" 3 ML MISC 1 Package by Does not apply route See admin instructions. 50 each 0   No current facility-administered medications on file prior to visit.   Allergies  Allergen Reactions  . Shrimp [Shellfish Allergy] Hives, Shortness Of Breath and Swelling  . Watermelon Concentrate Hives, Shortness Of Breath and Swelling  . Gadolinium  Derivatives Nausea And Vomiting  . Latex Rash   Family History  Problem Relation Age of Onset  . Hypertension Mother   . Breast cancer Maternal Grandmother   . Allergic rhinitis Father   . Asthma Father   . Migraines Brother   . Allergic rhinitis Brother   . Migraines Sister   . Angioedema Neg Hx   . Eczema Neg Hx   . Immunodeficiency Neg Hx   . Urticaria Neg Hx    PE: BP 118/60   Pulse 85   Ht 5\' 3"  (1.6 m)   Wt 169 lb (76.7 kg)   SpO2 96%   BMI 29.94 kg/m  Wt Readings from Last 3 Encounters:  09/17/19 169 lb (76.7 kg)  09/07/18 158 lb 4 oz (71.8 kg)  09/08/17 166 lb 12.8 oz (75.7 kg)   Constitutional: overweight, in NAD Eyes: PERRLA, EOMI, no exophthalmos ENT: moist  mucous membranes, no thyromegaly, no cervical lymphadenopathy Cardiovascular: RRR, No MRG Respiratory: CTA B Gastrointestinal: abdomen soft, NT, ND, BS+ Musculoskeletal: no deformities, strength intact in all 4 Skin: moist, warm, no rashes Neurological: no tremor with outstretched hands, DTR normal in all 4  ASSESSMENT: 1. Postablative Hypothyroidism  PLAN:  1. Patient with longstanding, uncontrolled, hypothyroidism, on a rather large dose of levothyroxine, returning after an absence of 2 years.  Her TFTs have improved after switching to Synthroid 200 mcg (50 mcg x4 tablets as this tablet has no coloring agents).  Also, her nausea resolved after the switch but she continued to have fatigue, cold intolerance, headaches.  We did try Tirosint for better GI absorption but she could not tolerate this well. -Since last visit, she had another TSH checked in 08/2018 but I was not aware of the results.  The level was suppressed: Lab Results  Component Value Date   TSH 0.03 (L) 09/07/2018   - she continues on Synthroid 200 mcg daily - dose was changed after the results above returned - pt was feeling good on this dose, but unfortunately, she ran out of the medication in 06/2019!  She started to feel fatigue, she  gained weight, has dry skin and some pressure in her neck since she stopped Synthroid.  I strongly advised her not to run out anymore and to keep her appointments. - we discussed about taking the thyroid hormone every day, with water, >30 minutes before breakfast, separated by >4 hours from acid reflux medications, calcium, iron, multivitamins. Pt. was taking it correctly. - will check thyroid tests today: TSH, free T3 and fT4 - after we restart Synthroid she will need to return for repeat TFTs in 1.5 months - OTW, I will see her back in 6 months  Since her TSH was suppressed on 200 mcg Synthroid, after results are back, I will send a prescription for 150 mcg pharmacy (3x 50 mcg daily)  Component     Latest Ref Rng & Units 09/17/2019  T4,Free(Direct)     0.60 - 1.60 ng/dL 2.54 Repeated and verified X2. (L)  TSH     0.35 - 4.50 uIU/mL 80.34 (H)  Triiodothyronine,Free,Serum     2.3 - 4.2 pg/mL 1.9 (L)  Tests are abysmal, as expected.  I will send a prescription for Synthroid 150 mcg to her pharmacy.  We will need to repeat her test in 1.5 months.  Carlus Pavlov, MD PhD [facility]

## 2019-09-18 NOTE — Telephone Encounter (Signed)
Please advise, does she need brand?

## 2019-09-18 NOTE — Telephone Encounter (Signed)
Yes

## 2019-09-18 NOTE — Telephone Encounter (Signed)
Lisa Pavlov, MD  Darliss Ridgel I, CMA  Eddis Pingleton, can you please call pt: As expected, her thyroid tests are terrible. I will call in a prescription for Synthroid 150 mcg daily to her pharmacy.

## 2019-09-23 NOTE — Telephone Encounter (Signed)
Called pt and gave her MD message and she verbalized understanding.

## 2019-09-25 ENCOUNTER — Telehealth: Payer: Self-pay

## 2019-09-25 NOTE — Telephone Encounter (Signed)
PA for Synthroid faxed to insurance.

## 2019-10-02 ENCOUNTER — Telehealth: Payer: Self-pay | Admitting: Internal Medicine

## 2019-10-02 NOTE — Telephone Encounter (Signed)
Patient called and stated that she went to pick up her RX for SYNTHROID on 09/23/19 and the pharmacy told her that she was unable to pick up her RX - they said it had already been picked up on 09/19/19 and the patient states she never picked it up. Patient ph# [phone]

## 2019-10-22 ENCOUNTER — Other Ambulatory Visit: Payer: Self-pay | Admitting: Family Medicine

## 2019-10-22 DIAGNOSIS — M5412 Radiculopathy, cervical region: Secondary | ICD-10-CM

## 2019-10-22 MED ORDER — SYNTHROID 150 MCG PO TABS: 150.0000 ug | ORAL_TABLET | Freq: Every day | ORAL | 2 refills | Status: DC

## 2019-10-22 NOTE — Telephone Encounter (Signed)
I never did get a response.  I just tried sending it through CoverMyMeds with the insurance we have on file, however she was not located in the system so I think we do not have her most current information.  I sent the RX again hoping it will go through but if it kicks back a PA we will need her insurance info.

## 2019-10-22 NOTE — Telephone Encounter (Signed)
Patient calling back stating she still is unable to pick up her Synthroid at  [id number] [place], [place] - [address] Phone:  [phone]  Fax:  [phone]     They have told patient they do not have it. I see there was a PA sent to her ins on 09/25/19 - have we heard anything back from that?  Patient ph# [phone]

## 2019-10-22 NOTE — Addendum Note (Signed)
Addended by: Darliss Ridgel I on: 10/22/2019 11:39 AM   Modules accepted: Orders

## 2019-10-29 ENCOUNTER — Ambulatory Visit: Payer: 59 | Admitting: Internal Medicine

## 2019-11-01 ENCOUNTER — Telehealth: Payer: Self-pay

## 2019-11-01 MED ORDER — LEVOTHYROXINE SODIUM 150 MCG PO TABS: 150.0000 ug | ORAL_TABLET | Freq: Every day | ORAL | 3 refills | Status: DC

## 2019-11-01 NOTE — Telephone Encounter (Signed)
After hours call from patient stating she still has not gotten her thyroid medication.  Chart reviewed and it was sent successfully to her pharmacy. I called the pharmacy to ask why she is not able to get it filled and they told me it has been filled and ready for pick up since March 5th.  The pharmacy did say the cost for her is 135.00 due to it being brand name and it is excluded from her insurance plan. I asked if that meant it needed a PA to get it at a lower price because we ALWAYS get notified from pharmacies then a PA is needed or started.  The pharmacist checked on that and said her insurance did not give the pharmacy an option to do or start a PA it is just excluded from coverage. I asked if they could provide me with her current insurance BIN PCN and Group number because we do not have her most current insurance card with this information. With this information I was able to enter a PA request with CoverMyMeds. Will await to see if they approve.

## 2019-11-01 NOTE — Telephone Encounter (Signed)
We can definitely switch to generic levothyroxine tablets from my point of view.

## 2019-11-01 NOTE — Telephone Encounter (Signed)
Notified patient of message from Dr. Elvera Lennox, patient expressed understanding and agreement. No further questions.  Patient agrees to change back to Levothyroxine.  New RX sent and med list updated.

## 2019-11-01 NOTE — Telephone Encounter (Signed)
PA denied for brand name, it is excluded from coverage in accordance with the terms and conditions of the plan benefit.  Please advise.

## 2019-11-09 ENCOUNTER — Other Ambulatory Visit: Payer: Self-pay

## 2019-11-09 ENCOUNTER — Ambulatory Visit
Admission: RE | Admit: 2019-11-09 | Discharge: 2019-11-09 | Disposition: A | Discharge status: Home / Self Care | Payer: 59 | Source: Ambulatory Visit | Attending: Family Medicine | Admitting: Family Medicine

## 2019-11-09 DIAGNOSIS — M5412 Radiculopathy, cervical region: Secondary | ICD-10-CM

## 2019-11-27 ENCOUNTER — Other Ambulatory Visit: Payer: Self-pay | Admitting: Obstetrics and Gynecology

## 2019-11-27 DIAGNOSIS — R921 Mammographic calcification found on diagnostic imaging of breast: Secondary | ICD-10-CM

## 2019-12-10 ENCOUNTER — Other Ambulatory Visit: Payer: Self-pay | Admitting: Orthopedic Surgery

## 2019-12-10 DIAGNOSIS — M545 Low back pain, unspecified: Secondary | ICD-10-CM

## 2019-12-11 ENCOUNTER — Other Ambulatory Visit: Payer: Self-pay

## 2019-12-13 ENCOUNTER — Encounter: Payer: Self-pay | Admitting: Internal Medicine

## 2019-12-13 ENCOUNTER — Ambulatory Visit: Payer: 59 | Admitting: Internal Medicine

## 2019-12-13 ENCOUNTER — Other Ambulatory Visit: Payer: Self-pay

## 2019-12-13 VITALS — BP 118/80 | HR 68 | Ht 63.0 in | Wt 170.0 lb

## 2019-12-13 DIAGNOSIS — E890 Postprocedural hypothyroidism: Secondary | ICD-10-CM | POA: Diagnosis not present

## 2019-12-13 NOTE — Patient Instructions (Signed)
Please continue Levothyroxine 150 mcg daily.  Take the thyroid hormone every day, with water, at least 30 minutes before breakfast, separated by at least 4 hours from: - acid reflux medications - calcium - iron - multivitamins  Please stop at the lab.  Please come back for a follow-up appointment in 6 months. 

## 2019-12-13 NOTE — Progress Notes (Signed)
Patient ID: Lisa Day, female   DOB: 30-Jun-1976, 44 y.o.   MRN: [id number]   This visit occurred during the SARS-CoV-2 public health emergency.  Safety protocols were in place, including screening questions prior to the visit, additional usage of staff PPE, and extensive cleaning of exam room while observing appropriate contact time as indicated for disinfecting solutions.   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  Last visit 3 months ago.  At last visit, in 08/2019, since she could not refill her Synthroid after an absence of 2 years, she was off the medication for 2 weeks.  A TSH returned very high.  She was feeling poorly then: Fatigue, hair loss, weight gain, dry skin.  These have improved.  Reviewed and addended history: Pt. 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   She had more nausea and migraines since starting Tirosint.   We changed to Synthroid d.a.w in an effort to minimize adverse effects from the thyroid hormone tablet.  Her nausea resolved after switching to Synthroid 50 mcg tablets.  !!Previously, she was taking 200 mcg of Synthroid daily, but at last visit she was off the medication!!  We restarted Synthroid 150 mcg daily (she was only able to start this ~2 mo ago), but she had to change to generic in 10/2019 due to insurance coverage.  She takes this: - in am - fasting - at least 30 min from b'fast - no Ca, Fe, PPIs - + MVI - not on Biotin  Reviewed her TFTs: Lab Results  Component Value Date   TSH 80.34 (H) 09/17/2019   TSH 0.03 (L) 09/07/2018   TSH 12.63 (A) 06/04/2018   TSH 0.41 09/08/2017   TSH 9.43 (H) 03/09/2017   TSH 19.70 (H) 11/07/2016   TSH 10.06 (H) 09/19/2016   TSH 0.701 03/16/2011   FREET4 0.00 Repeated  and verified X2. (L) 09/17/2019   FREET4 1.58 09/14/2018   FREET4 1.71 (H) 09/08/2017   FREET4 0.75 03/09/2017   FREET4 0.50 (L) 11/07/2016   FREET4 0.70 09/19/2016  08/03/2016: TSH 22.47 06/20/2016: TSH 8.52  05/03/2016: TSH 47.3, free T4 0.28, free T3 2.36 05/25/2008: TSH 0.01, free T4 4.58   Pt denies: - feeling nodules in neck - hoarseness - dysphagia - choking - SOB with lying down  No FH of thyroid cancer. No h/o radiation tx to head or neck except RAI treatment.  No recent contrast studies. No herbal supplements. No Biotin use. No recent steroids use.   She also has a history of asthma and headaches, fibromyalgia-previously on Cymbalta, amitryptiline.   On B12 injections.  ROS: Constitutional: no weight gain/no weight loss, no fatigue, no subjective hyperthermia, no subjective hypothermia Eyes: no blurry vision, no xerophthalmia ENT: no sore throat, + see HPI Cardiovascular: no CP/no SOB/no palpitations/no leg swelling Respiratory: no cough/no SOB/no wheezing Gastrointestinal: no N/no V/no D/no C/no acid reflux Musculoskeletal: no muscle aches/no joint aches Skin: no rashes, no hair loss Neurological: no tremors/no numbness/no tingling/no dizziness  I reviewed pt's medications, allergies, PMH, social hx, family hx, and changes were documented in the history of present illness. Otherwise, unchanged from my initial visit note.  Past Medical History:  Diagnosis Date  . Allergic rhinitis   . Anxiety and depression   .  Asthma   . Fibromyalgia   . Grave's disease   . Headache 07/02/2014   Past Surgical History:  Procedure Laterality Date  . APPENDECTOMY  2001  . CESAREAN SECTION  04/20/2011   Procedure: CESAREAN SECTION;  Surgeon: Cyril Mourning, MD;  Location: [facility];  Service: Gynecology;  Laterality: N/A;  Primary Cesarean Section with Delivery Baby Boy @ [phone]  . CHOLECYSTECTOMY  2006  . HIP ARTHROSCOPY     Social History   Social History  . Marital  status: Single    Spouse name: N/A  . 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 Smoking status: Never Smoker  . Smokeless tobacco: Never Used  . Alcohol use No  . Drug use: No   Current Outpatient Medications on File Prior to Visit  Medication Sig Dispense Refill  . acetaminophen (TYLENOL) 325 MG tablet Take 650 mg by mouth every 6 (six) hours as needed for moderate pain or headache.    . albuterol (PROVENTIL HFA;VENTOLIN HFA) 108 (90 Base) MCG/ACT inhaler Inhale 2 puffs into the lungs every 4 (four) hours as needed for wheezing or shortness of breath. 1 Inhaler 2  . beclomethasone (QVAR) 80 MCG/ACT inhaler Inhale 2 puffs into the lungs 2 (two) times daily. (Patient taking differently: Inhale 1 puff into the lungs 2 (two) times daily. ) 1 Inhaler 5  . cyanocobalamin (,VITAMIN B-12,) 1000 MCG/ML injection 1 ml IM daily x 7 days then 1 ml IM weekly x 4 weeks then 1 ml monthly x 1 year 30 mL 0  . cyclobenzaprine (FLEXERIL) 10 MG tablet Take 10 mg by mouth 3 (three) times daily as needed for muscle spasms.    . DULoxetine (CYMBALTA) 20 MG capsule 1 CAPSULE ONCE DAILY FOR 2 WEEKS, THEN TWICE DAILY AFTER THAT AS DIRECTED ORALLY 30 DAY(S)  11  . EPINEPHrine 0.3 mg/0.3 mL IJ SOAJ injection USE AS DIRECTED FOR SEVERE ALLERGIC REACTION. 2 Device 2  . fluticasone (FLONASE) 50 MCG/ACT nasal spray Place 2 sprays into both nostrils at bedtime. 16 g 5  . levocetirizine (XYZAL) 5 MG tablet Take 1 tablet (5 mg total) by mouth every evening. 30 tablet 5  . levonorgestrel (MIRENA) 20 MCG/24HR IUD 1 each by Intrauterine route once.    Marland Kitchen levothyroxine (SYNTHROID) 150 MCG tablet Take 1 tablet (150 mcg total) by mouth daily. 90 tablet 3  . montelukast (SINGULAIR) 10 MG tablet Take 1 tablet (10 mg total) by mouth at bedtime. 30 tablet 0  . Syringe/Needle, Disp, (SYRINGE 3CC/25GX1") 25G X 1" 3 ML MISC 1 Package by Does not apply route See admin instructions. 50 each  0   No current facility-administered medications on file prior to visit.   Allergies  Allergen Reactions  . Shrimp [Shellfish Allergy] Hives, Shortness Of Breath and Swelling  . Watermelon Concentrate Hives, Shortness Of Breath and Swelling  . Gadolinium Derivatives Nausea And Vomiting  . Latex Rash   Family History  Problem Relation Age of Onset  . Hypertension Mother   . Breast cancer Maternal Grandmother   . Allergic rhinitis Father   . Asthma Father   . Migraines Brother   . Allergic rhinitis Brother   . Migraines Sister   . Angioedema Neg Hx   . Eczema Neg Hx   . Immunodeficiency Neg Hx   . Urticaria Neg Hx    PE: BP 118/80   Pulse 68   Ht 5'  3" (1.6 m)   Wt 170 lb (77.1 kg)   BMI 30.11 kg/m  Wt Readings from Last 3 Encounters:  12/13/19 170 lb (77.1 kg)  09/17/19 169 lb (76.7 kg)  09/07/18 158 lb 4 oz (71.8 kg)   Constitutional: overweight, in NAD Eyes: PERRLA, EOMI, no exophthalmos ENT: moist mucous membranes, no thyromegaly, no cervical lymphadenopathy Cardiovascular: RRR, No MRG Respiratory: CTA B Gastrointestinal: abdomen soft, NT, ND, BS+ Musculoskeletal: no deformities, strength intact in all 4 Skin: moist, warm, no rashes Neurological: no tremor with outstretched hands, DTR normal in all 4  ASSESSMENT: 1. Postablative Hypothyroidism  PLAN:  1. Patient with longstanding, uncontrolled, hypothyroidism, on a rather large dose of levothyroxine in the past, whom I seen 3 months ago after an absence of 2 years.  In the interim, she was not able to refill her Synthroid so she had to come off the medication for 2 months.  A TSH returned very high, at 80.34.  At that time, we restarted Synthroid, at a lower dose, 150 mcg daily, which which she was finally able to start in 09/2019.  Last month, her insurance stopped covering the brand name, so she switched to generic.  She has no problems tolerating the generic tablet of 150 mcg daily of note, we had to use 50 mcg  tablets in the past due to the lack of coloring agents.  However, she is tolerating the higher dose tablet now. - latest thyroid labs reviewed with pt >> very high at 3 months ago: Lab Results  Component Value Date   TSH 80.34 (H) 09/17/2019   - she continues on LT4 150 mcg daily - pt feels good on this dose, however, she did not lose weight, weight is stable since last visit - we discussed about taking the thyroid hormone every day, with water, >30 minutes before breakfast, separated by >4 hours from acid reflux medications, calcium, iron, multivitamins. Pt. is taking it correctly. - will check thyroid tests today: TSH and fT4 - If labs are abnormal, she will need to return for repeat TFTs in 1.5 months - OTW, I we will see her back in 6 month  Component     Latest Ref Rng & Units 12/13/2019  T4,Free(Direct)     0.82 - 1.77 ng/dL 1.61  TSH     0.960 - 4.540 uIU/mL 0.605  Normal TFTs now.  We will continue the current dose of levothyroxine.  Lisa Pavlov, MD PhD [facility]

## 2019-12-14 LAB — TSH: TSH: 0.605 u[IU]/mL (ref 0.450–4.500)

## 2019-12-14 LAB — T4, FREE: Free T4: 1.7 ng/dL (ref 0.82–1.77)

## 2019-12-18 ENCOUNTER — Ambulatory Visit
Admission: RE | Admit: 2019-12-18 | Discharge: 2019-12-18 | Disposition: A | Discharge status: Home / Self Care | Payer: 59 | Source: Ambulatory Visit | Attending: Obstetrics and Gynecology | Admitting: Obstetrics and Gynecology

## 2019-12-18 ENCOUNTER — Other Ambulatory Visit: Payer: Self-pay

## 2019-12-18 DIAGNOSIS — R921 Mammographic calcification found on diagnostic imaging of breast: Secondary | ICD-10-CM

## 2019-12-30 ENCOUNTER — Ambulatory Visit
Admission: RE | Admit: 2019-12-30 | Discharge: 2019-12-30 | Disposition: A | Discharge status: Home / Self Care | Payer: 59 | Source: Ambulatory Visit | Attending: Orthopedic Surgery | Admitting: Orthopedic Surgery

## 2019-12-30 ENCOUNTER — Other Ambulatory Visit: Payer: Self-pay

## 2019-12-30 DIAGNOSIS — M545 Low back pain, unspecified: Secondary | ICD-10-CM

## 2020-06-05 ENCOUNTER — Other Ambulatory Visit: Payer: Self-pay | Admitting: Neurosurgery

## 2020-06-05 DIAGNOSIS — M4802 Spinal stenosis, cervical region: Secondary | ICD-10-CM

## 2020-06-18 ENCOUNTER — Ambulatory Visit
Admission: RE | Admit: 2020-06-18 | Discharge: 2020-06-18 | Disposition: A | Discharge status: Home / Self Care | Payer: 59 | Source: Ambulatory Visit | Attending: Neurosurgery | Admitting: Neurosurgery

## 2020-06-18 DIAGNOSIS — M4802 Spinal stenosis, cervical region: Secondary | ICD-10-CM

## 2020-06-19 ENCOUNTER — Ambulatory Visit: Payer: 59 | Admitting: Internal Medicine

## 2020-06-19 ENCOUNTER — Encounter: Payer: Self-pay | Admitting: Internal Medicine

## 2020-06-19 ENCOUNTER — Other Ambulatory Visit: Payer: Self-pay

## 2020-06-19 VITALS — BP 120/78 | HR 100 | Ht 63.0 in | Wt 173.4 lb

## 2020-06-19 DIAGNOSIS — E890 Postprocedural hypothyroidism: Secondary | ICD-10-CM | POA: Diagnosis not present

## 2020-06-19 NOTE — Progress Notes (Signed)
Patient ID: Lisa Day, female   DOB: 12-03-1975, 44 y.o.   MRN: [id number]   This visit occurred during the SARS-CoV-2 public health emergency.  Safety protocols were in place, including screening questions prior to the visit, additional usage of staff PPE, and extensive cleaning of exam room while observing appropriate contact time as indicated for disinfecting solutions.   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  Last visit 6 months ago.  Reviewed and addended history: Pt. has been dx with Payeur' disease in 2006 and developed post-ablative hypothyroidism after RAI treatment >> on Levothyroxine 175 mcg >> now on Tirosint 150 mcg + added 25 mcg daily in 08/2016.   She had more nausea and migraines since starting Tirosint.   We changed to Synthroid d.a.w in an effort to minimize adverse effects from the thyroid hormone tablet.  Her nausea resolved after switching to Synthroid 50 mcg tablets.  Previously, she was taking 200 mcg of Synthroid daily, but in 08/2019 she was off the medication!  We started LT4 150 mcg daily (changed to generic 10/2019 due to insurance coverage) her TFTs improved to normal.  Pt is on levothyroxine 150 mcg daily, taken: - in am - fasting - at least 30 min from b'fast - no calcium - no iron - no multivitamins - no PPIs - not on Biotin  In 08/2019, as she could not refill her Synthroid due to an absence of 2 years from the office, she was off the medication for 2 weeks.  The TSH returned very high, in the 80s, and she was feeling very poorly: Fatigue, hair loss, weight gain, dry skin.  These have all improved.    Reviewed her TFTs: Lab Results  Component Value Date   TSH 0.605 12/13/2019   TSH 80.34 (H) 09/17/2019   TSH 0.03 (L) 09/07/2018   TSH 12.63 (A) 06/04/2018   TSH 0.41 09/08/2017   TSH 9.43 (H) 03/09/2017   TSH 19.70 (H) 11/07/2016   TSH 10.06 (H)  09/19/2016   TSH 0.701 03/16/2011   FREET4 1.70 12/13/2019   FREET4 0.00 Repeated and verified X2. (L) 09/17/2019   FREET4 1.58 09/14/2018   FREET4 1.71 (H) 09/08/2017   FREET4 0.75 03/09/2017   FREET4 0.50 (L) 11/07/2016   FREET4 0.70 09/19/2016  08/03/2016: TSH 22.47 06/20/2016: TSH 8.52  05/03/2016: TSH 47.3, free T4 0.28, free T3 2.36 05/25/2008: TSH 0.01, free T4 4.58   Pt denies: - feeling nodules in neck - hoarseness - dysphagia - choking - SOB with lying down  No FH of thyroid cancer. No h/o radiation tx to head or neck.  No herbal supplements. No Biotin use. No recent steroids use - on Prednisone 1 mo ago.  She also has a history of asthma, headaches, fibromyalgia-previously on Cymbalta, amitriptyline.  Prev. On B12 injections, now off.  ROS: Constitutional: no weight gain/no weight loss, no fatigue,+ subjective hyperthermia (night sweats), no subjective hypothermia Eyes: no blurry vision, no xerophthalmia ENT: no sore throat, + see HPI Cardiovascular: no CP/no SOB/no palpitations/no leg swelling Respiratory: no cough/no SOB/no wheezing Gastrointestinal: no N/no V/no D/no C/no acid reflux Musculoskeletal: no muscle aches/no joint aches Skin: no rashes, no hair loss Neurological: no tremors/no numbness/no tingling/no dizziness, + HA (not new)  I reviewed pt's medications, allergies, PMH, social hx, family hx, and changes were documented in the history of present illness. Otherwise, unchanged from my initial visit  note.  Past Medical History:  Diagnosis Date  . Allergic rhinitis   . Anxiety and depression   . Asthma   . Fibromyalgia   . Grave's disease   . Headache 07/02/2014   Past Surgical History:  Procedure Laterality Date  . APPENDECTOMY  2001  . CESAREAN SECTION  04/20/2011   Procedure: CESAREAN SECTION;  Surgeon: Jeani Hawking, MD;  Location: [facility];  Service: Gynecology;  Laterality: N/A;  Primary Cesarean Section with Delivery Baby Boy @ [phone]   . CHOLECYSTECTOMY  2006  . HIP ARTHROSCOPY     Social History   Social History  . Marital status: Single    Spouse name: N/A  . 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 Smoking status: Never Smoker  . Smokeless tobacco: Never Used  . Alcohol use No  . Drug use: No   Current Outpatient Medications on File Prior to Visit  Medication Sig Dispense Refill  . acetaminophen (TYLENOL) 325 MG tablet Take 650 mg by mouth every 6 (six) hours as needed for moderate pain or headache.    . albuterol (PROVENTIL HFA;VENTOLIN HFA) 108 (90 Base) MCG/ACT inhaler Inhale 2 puffs into the lungs every 4 (four) hours as needed for wheezing or shortness of breath. 1 Inhaler 2  . beclomethasone (QVAR) 80 MCG/ACT inhaler Inhale 2 puffs into the lungs 2 (two) times daily. (Patient taking differently: Inhale 1 puff into the lungs 2 (two) times daily. ) 1 Inhaler 5  . cyanocobalamin (,VITAMIN B-12,) 1000 MCG/ML injection 1 ml IM daily x 7 days then 1 ml IM weekly x 4 weeks then 1 ml monthly x 1 year 30 mL 0  . cyclobenzaprine (FLEXERIL) 10 MG tablet Take 10 mg by mouth 3 (three) times daily as needed for muscle spasms.    . DULoxetine (CYMBALTA) 20 MG capsule 1 CAPSULE ONCE DAILY FOR 2 WEEKS, THEN TWICE DAILY AFTER THAT AS DIRECTED ORALLY 30 DAY(S)  11  . EPINEPHrine 0.3 mg/0.3 mL IJ SOAJ injection USE AS DIRECTED FOR SEVERE ALLERGIC REACTION. 2 Device 2  . fluticasone (FLONASE) 50 MCG/ACT nasal spray Place 2 sprays into both nostrils at bedtime. 16 g 5  . levocetirizine (XYZAL) 5 MG tablet Take 1 tablet (5 mg total) by mouth every evening. 30 tablet 5  . levonorgestrel (MIRENA) 20 MCG/24HR IUD 1 each by Intrauterine route once.    Marland Kitchen levothyroxine (SYNTHROID) 150 MCG tablet Take 1 tablet (150 mcg total) by mouth daily. 90 tablet 3  . montelukast (SINGULAIR) 10 MG tablet Take 1 tablet (10 mg total) by mouth at bedtime. 30 tablet 0  . Syringe/Needle, Disp, (SYRINGE  3CC/25GX1") 25G X 1" 3 ML MISC 1 Package by Does not apply route See admin instructions. 50 each 0   No current facility-administered medications on file prior to visit.   Allergies  Allergen Reactions  . Shrimp [Shellfish Allergy] Hives, Shortness Of Breath and Swelling  . Watermelon Concentrate Hives, Shortness Of Breath and Swelling  . Gadolinium Derivatives Nausea And Vomiting  . Latex Rash   Family History  Problem Relation Age of Onset  . Hypertension Mother   . Breast cancer Maternal Grandmother   . Allergic rhinitis Father   . Asthma Father   . Migraines Brother   . Allergic rhinitis Brother   . Migraines Sister   . Angioedema Neg Hx   . Eczema Neg Hx   . Immunodeficiency Neg  Hx   . Urticaria Neg Hx    PE: BP 120/78   Pulse 100   Ht 5\' 3"  (1.6 m)   Wt 173 lb 6.4 oz (78.7 kg)   BMI 30.72 kg/m  Wt Readings from Last 3 Encounters:  06/19/20 173 lb 6.4 oz (78.7 kg)  12/13/19 170 lb (77.1 kg)  09/17/19 169 lb (76.7 kg)   Constitutional: overweight, in NAD Eyes: PERRLA, EOMI, no exophthalmos ENT: moist mucous membranes, no thyromegaly, no cervical lymphadenopathy Cardiovascular: tachycardia, RR, No MRG Respiratory: CTA B Gastrointestinal: abdomen soft, NT, ND, BS+ Musculoskeletal: no deformities, strength intact in all 4 Skin: moist, warm, no rashes Neurological: no tremor with outstretched hands, DTR normal in all 4  ASSESSMENT: 1. Postablative Hypothyroidism  PLAN:  1. Patient with longstanding, uncontrolled, hypothyroidism, with history of being lost for follow-up for 2 years before our visit in 08/2019, during which she ran out levothyroxine.  At that time she was on 200 mcg daily.  A TSH obtained at that visit was very high, at 80.  We restarted levothyroxine, a lower dose, 150 mcg daily and her TFTs normalized.  She continues on this dose now.  Of note, in the past, we had to use 50 mcg tablets due to lack of coloring agents, however, she is tolerating the  150 mcg tablet well. - latest thyroid labs reviewed with pt >> normal: Lab Results  Component Value Date   TSH 0.605 12/13/2019   - she continues on LT4 150 mcg daily - pt feels good on this dose.  She does complain of night sweats, as well as headaches, but they are both chronic. - we discussed about taking the thyroid hormone every day, with water, >30 minutes before breakfast, separated by >4 hours from acid reflux medications, calcium, iron, multivitamins. Pt. is taking it correctly. - will check thyroid tests today: TSH and fT4 - If labs are abnormal, she will need to return for repeat TFTs in 1.5 months - OTW, I will see her back in a year  Component     Latest Ref Rng & Units 06/19/2020  T4,Free(Direct)     0.82 - 1.77 ng/dL 06/21/2020  TSH     5.46 - 2.703 uIU/mL 36.800 (H)  TSH very high.  I suspect incomplete compliance with levothyroxine.  I will check with her.  5.009, MD PhD [facility]

## 2020-06-19 NOTE — Patient Instructions (Signed)
Please continue Levothyroxine 150 mcg daily.  Take the thyroid hormone every day, with water, at least 30 minutes before breakfast, separated by at least 4 hours from: - acid reflux medications - calcium - iron - multivitamins  Please stop at the lab.  Please come back for a follow-up appointment in 6 months. 

## 2020-06-20 LAB — TSH: TSH: 36.8 u[IU]/mL — ABNORMAL HIGH (ref 0.450–4.500)

## 2020-06-20 LAB — T4, FREE: Free T4: 0.91 ng/dL (ref 0.82–1.77)

## 2020-06-23 ENCOUNTER — Ambulatory Visit: Payer: 59 | Admitting: Physical Therapy

## 2020-06-23 ENCOUNTER — Encounter: Payer: Self-pay | Admitting: Internal Medicine

## 2020-07-06 ENCOUNTER — Other Ambulatory Visit: Payer: Self-pay

## 2020-07-06 ENCOUNTER — Encounter: Payer: Self-pay | Admitting: Physical Therapy

## 2020-07-06 ENCOUNTER — Ambulatory Visit: Payer: 59 | Attending: Neurosurgery | Admitting: Physical Therapy

## 2020-07-06 DIAGNOSIS — M6281 Muscle weakness (generalized): Secondary | ICD-10-CM | POA: Diagnosis present

## 2020-07-06 DIAGNOSIS — M542 Cervicalgia: Secondary | ICD-10-CM | POA: Insufficient documentation

## 2020-07-06 DIAGNOSIS — R293 Abnormal posture: Secondary | ICD-10-CM | POA: Insufficient documentation

## 2020-07-06 NOTE — Patient Instructions (Signed)
Access Code: [email or web address] URL: [email or web address] Date: 07/06/2020 Prepared by: Rosana Hoes  Exercises Cervical Extension AROM with Strap - 3 x daily - 7 x weekly - 10 reps Seated Assisted Cervical Rotation with Towel - 3 x daily - 7 x weekly - 10 reps Gentle Upper Trap Stretch - 3 x daily - 7 x weekly - 1-2 reps - 20 seconds hold Gentle Levator Scapulae Stretch - 3 x daily - 7 x weekly - 1-2 reps - 20 seconds hold Standing Upper Trapezius Mobilization with Small Ball Seated Scapular Retraction - 3 x daily - 7 x weekly - 10 reps - 3-5 seconds hold Seated Thoracic Lumbar Extension with Pectoralis Stretch - 3 x daily - 7 x weekly - 10 reps

## 2020-07-07 ENCOUNTER — Encounter: Payer: Self-pay | Admitting: Physical Therapy

## 2020-07-07 NOTE — Therapy (Addendum)
[place], [place], [address] Phone: [phone]   Fax:  [phone]  Physical Therapy Evaluation / Discharge  Patient Details  Name: Lisa Day MRN: [id number] Date of Birth: Mar 28, 1976 Referring Provider (PT): Vallarie Mare, MD   Encounter Date: 07/06/2020   PT End of Session - 07/06/20 1451    Visit Number 1    Number of Visits 8    Date for PT Re-Evaluation 08/31/20    Authorization Type UHC    Authorization Time Period FOTO by 6th visit    PT Start Time 1451    PT Stop Time 1530    PT Time Calculation (min) 39 min    Activity Tolerance Patient tolerated treatment well    Behavior During Therapy [facility] for tasks assessed/performed           Past Medical History:  Diagnosis Date  . Allergic rhinitis   . Anxiety and depression   . Asthma   . Fibromyalgia   . Grave's disease   . Headache 07/02/2014    Past Surgical History:  Procedure Laterality Date  . APPENDECTOMY  2001  . CESAREAN SECTION  04/20/2011   Procedure: CESAREAN SECTION;  Surgeon: Cyril Mourning, MD;  Location: [facility];  Service: Gynecology;  Laterality: N/A;  Primary Cesarean Section with Delivery Baby Boy @ [phone]  . CHOLECYSTECTOMY  2006  . HIP ARTHROSCOPY      There were no vitals filed for this visit.    Subjective Assessment - 07/06/20 1452    Subjective Patient reports she has been having neck, shoulder, and back pain. Has been going on for a while but last 6 months has been very bad. Starts at the base of the neck and radiates down. Pain comes and goes, sometimes is present for days and others where she won't have any pain.    Pertinent History Rieser disease    Limitations Sitting;Standing;House hold activities    How long can you sit comfortably? 30 minutes    How long can you stand comfortably? 15 minutes    How long can you walk comfortably? 1 hour    Diagnostic tests X-ray, MRI, CT    Patient Stated  Goals Relief from pain    Currently in Pain? Yes    Pain Score 8     Pain Location Neck    Pain Orientation Lower    Pain Descriptors / Indicators Aching;Tightness;Sharp;Throbbing    Pain Type Chronic pain    Pain Radiating Towards Starts at base of the neck, radiates down back, center to the right side    Pain Onset More than a month ago    Pain Frequency Constant    Aggravating Factors  Driving, sitting up straight, lying down on back    Pain Relieving Factors Heat    Effect of Pain on Daily Activities Patient limited with tasks at work while sitting, driving, general activity              [facility] PT Assessment - 07/07/20 0001      Assessment   Medical Diagnosis Spinal stenosis, cervical region    Referring Provider (PT) Vallarie Mare, MD    Onset Date/Surgical Date --   ongoing for years, worsening past 6 months   Next MD Visit 07/20/2020    Prior Therapy None      Precautions   Precautions None      Restrictions   Weight Bearing Restrictions No  Balance Screen   Has the patient fallen in the past 6 months No    Has the patient had a decrease in activity level because of a fear of falling?  No    Is the patient reluctant to leave their home because of a fear of falling?  No      Prior Function   Level of Independence Independent    Vocation Full time employment    Vocation Requirements Patient works at home so is mostly sitting, working on [employer] climbing      Cognition   Overall Cognitive Status Within Functional Limits for tasks assessed      Observation/Other Assessments   Observations Patient appears in no apparent distress    Focus on Therapeutic Outcomes (FOTO)  46% functional status      Sensation   Light Touch Appears Intact      Coordination   Gross Motor Movements are Fluid and Coordinated Yes      Posture/Postural Control   Posture Comments Rounded shoulder and forward head posture      ROM / Strength   AROM /  PROM / Strength AROM;Strength      AROM   Overall AROM Comments Shoulder AROM grossly WFL, she does perform movement cautiously and reports upper trap discomfort    AROM Assessment Site Cervical    Cervical Flexion 50    Cervical Extension 25   "feels like something is stuck"   Cervical - Right Side Bend 15   "feels like something is stuck"   Cervical - Left Side Bend 30    Cervical - Right Rotation 70   increased pain right upper trap   Cervical - Left Rotation 70      Strength   Overall Strength Comments Myotomal testing WFL, periscapular strength grossly 4-/5 MMT      Palpation   Spinal mobility Cervical CPA with increased pain around C4-C7, concordant to "stuck" feeling    Palpation comment TTP bilateral cervical paraspinals, upper/mid trap region, rhomboids, posterior cuff      Special Tests   Other special tests Spurling's negative for pain distal to shoulder region      Transfers   Transfers Independent with all Transfers                      Objective measurements completed on examination: See above findings.       [place] Adult PT Treatment/Exercise - 07/07/20 0001      Exercises   Exercises Neck      Neck Exercises: Seated   Shoulder Rolls 10 reps   5 sec hold   Shoulder Rolls Limitations shoulder blade squeezes, cue to avoid shrug      Manual Therapy   Manual therapy comments Instructed patient on self STM using ball for upper trap, levator, posterior cuff, rhomboid regions      Neck Exercises: Stretches   Upper Trapezius Stretch 20 seconds    Levator Stretch 20 seconds    Other Neck Stretches Cervical SNAG for rotation and extension using pillow case x 10 each    Other Neck Stretches Seated thoracic extension over chair with [place] for chest stretch x 10                  PT Education - 07/06/20 1451    Education Details Exam findings, POC, HEP    Person(s) Educated Patient    Methods Explanation;Demonstration;Verbal cues;Handout  Comprehension Verbalized understanding;Returned demonstration;Verbal cues required;Need further instruction            PT Short Term Goals - 07/06/20 1503      PT SHORT TERM GOAL #1   Title Patient will be I with initial HEP to progress with PT    Time 4    Period Weeks    Status New    Target Date 08/03/20      PT SHORT TERM GOAL #2   Title PT with review FOTO results with patient by 3rd visit    Time 3    Period Weeks    Status New    Target Date 07/27/20      PT SHORT TERM GOAL #3   Title Patient will report resting pain level </= 5/10 pain level to improve functional ability    Time 4    Period Weeks    Status New    Target Date 08/03/20             PT Long Term Goals - 07/07/20 0820      PT LONG TERM GOAL #1   Title Patient will be I with final HEP to maintain progress from PT    Time 8    Period Weeks    Status New    Target Date 08/31/20      PT LONG TERM GOAL #2   Title Patient will report improved functional status of >/= 62% on FOTO    Time 8    Period Weeks    Status New    Target Date 08/31/20      PT LONG TERM GOAL #3   Title Patient will demonstrate cervical AROM WFL and </= 2/10 pain level to imrpove safety with driving and ability to look up for household tasks    Time 8    Period Weeks    Status New    Target Date 08/31/20      PT LONG TERM GOAL #4   Title Patient will demonstrate improve periscapular strength to >/= 4/5 MMT in order to tolerate sitting >/= 45 minutes so she can work without limitation    Time 8    Period Weeks    Status New    Target Date 08/31/20      PT LONG TERM GOAL #5   Title Patient will report pain level </= 4/5 with all activities to reduce functional limitation    Time 8    Period Weeks    Status New    Target Date 08/31/20                  Plan - 07/06/20 1502    Clinical Impression Statement Patient presents to PT with report of chronic neck pain that has been worsening over past 6 months  with no apparent mechanism. Patient exhibits postural deficits, limitation in active motion due to muscular and joint mobility impairments, postural weakness and endurance deficits, and chronic pain that is likely contributing to her symptoms. Patient was provided exercises to initiate mobility progression for cervical region and postural control to reduce pain. She would benefit from continued skilled PT to progress her mobility and strength to reduce pain and maximize functional level.    Personal Factors and Comorbidities Time since onset of injury/illness/exacerbation;Past/Current Experience;Comorbidity 2    Comorbidities Febres disease, history of fibromyalgia    Examination-Activity Limitations Sit;Stand;Locomotion Level;Lift;Reach Overhead    Examination-Participation Restrictions Cleaning;Occupation;Meal Prep;Community Activity;Driving;Laundry;Shop    Stability/Clinical Decision Making Evolving/Moderate  complexity    Clinical Decision Making Moderate    Rehab Potential Good    PT Frequency 1x / week    PT Duration 8 weeks    PT Treatment/Interventions ADLs/Self Care Home Management;Cryotherapy;Electrical Stimulation;Iontophoresis 56m/ml Dexamethasone;Moist Heat;Traction;Ultrasound;Neuromuscular re-education;Balance training;Therapeutic exercise;Therapeutic activities;Functional mobility training;Stair training;Gait training;Patient/family education;Manual techniques;Dry needling;Passive range of motion;Taping;Spinal Manipulations;Joint Manipulations    PT Next Visit Plan Review HEP and progress PRN, manual/DN/modalities for cervical mobility and upper trap region, incorporate chin tucks for postural conrol, postural strengthening    PT Home Exercise Plan [id number]: cervical SNAG for extension and rotation, upper trap and levator stretch, shoulder blade squeezes, thoracic extension over chair with HSt. Libory self STM using tennis ball    Consulted and Agree with Plan of Care Patient            Patient will benefit from skilled therapeutic intervention in order to improve the following deficits and impairments:  Decreased range of motion, Pain, Decreased activity tolerance, Postural dysfunction, Decreased strength, Impaired flexibility  Visit Diagnosis: Cervicalgia  Muscle weakness (generalized)  Abnormal posture     Problem List Patient Active Problem List   Diagnosis Date Noted  . 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 Allergy with anaphylaxis due to food 05/16/2016  . Allergic rhinitis due to pollen 05/16/2016  . Moderate persistent asthma 05/16/2016  . Right hip pain 05/01/2015  . Headache 07/02/2014  . Routine health maintenance 01/02/2013  . S/P cesarean section 04/20/2011  . Current pregnancy with history of pre-term labor 03/17/2011    CHilda Blades PT, DPT, LAT, ATC 07/07/20  8:37 AM Phone: [phone]: 3[place] SquareCM[facility] [address]Phone: [phone]  Fax:  [phone] Name: Lisa LILJAMRN: [id number] of Birth: 2Oct 06, 1977  PHYSICAL THERAPY DISCHARGE SUMMARY  Visits from Start of Care: 1  Current functional level related to goals / functional outcomes: See above   Remaining deficits: See above   Education / Equipment: HEP Plan:                                                    Patient goals were not met. Patient is being discharged due to not returning since the last visit.  ?????    CHilda Blades PT, DPT, LAT, ATC 08/19/20  3:15 PM Phone: [phone]: [phone]

## 2020-07-28 ENCOUNTER — Ambulatory Visit: Payer: 59 | Admitting: Physical Therapy

## 2020-07-30 ENCOUNTER — Encounter: Payer: 59 | Admitting: Physical Therapy

## 2020-08-04 ENCOUNTER — Ambulatory Visit: Payer: 59 | Admitting: Physical Therapy

## 2020-08-11 ENCOUNTER — Ambulatory Visit: Payer: 59 | Attending: Neurosurgery | Admitting: Physical Therapy

## 2020-08-13 ENCOUNTER — Telehealth: Payer: Self-pay | Admitting: Physical Therapy

## 2020-08-13 NOTE — Telephone Encounter (Signed)
Attempted to contact patient due to missed appointment on 08/11/20. Left VM informing patient of missed appointment and next scheduled appointment on 08/18/20. Reminded patient of attendance policy.   Rosana Hoes, PT, DPT, LAT, ATC 08/13/20  8:20 AM Phone: [phone] Fax: [phone]

## 2020-08-18 ENCOUNTER — Ambulatory Visit: Payer: 59 | Admitting: Physical Therapy

## 2020-08-19 ENCOUNTER — Telehealth: Payer: Self-pay | Admitting: Physical Therapy

## 2020-08-19 NOTE — Telephone Encounter (Signed)
Attempted to contact patient due to 2nd consecutive no-show. Left VM. Patient was informed that she would be discharged from PT due to attendance policy and she would need new PT referral in order to schedule more visits.   Rosana Hoes, PT, DPT, LAT, ATC 08/19/20  3:13 PM Phone: [phone] Fax: [phone]

## 2020-12-16 ENCOUNTER — Other Ambulatory Visit: Payer: Self-pay | Admitting: Obstetrics and Gynecology

## 2020-12-16 DIAGNOSIS — Z09 Encounter for follow-up examination after completed treatment for conditions other than malignant neoplasm: Secondary | ICD-10-CM

## 2020-12-16 DIAGNOSIS — N63 Unspecified lump in unspecified breast: Secondary | ICD-10-CM

## 2021-01-21 ENCOUNTER — Ambulatory Visit
Admission: RE | Admit: 2021-01-21 | Discharge: 2021-01-21 | Disposition: A | Discharge status: Home / Self Care | Payer: 59 | Source: Ambulatory Visit | Attending: Obstetrics and Gynecology | Admitting: Obstetrics and Gynecology

## 2021-01-21 ENCOUNTER — Other Ambulatory Visit: Payer: Self-pay

## 2021-01-21 DIAGNOSIS — Z09 Encounter for follow-up examination after completed treatment for conditions other than malignant neoplasm: Secondary | ICD-10-CM

## 2021-01-21 DIAGNOSIS — N63 Unspecified lump in unspecified breast: Secondary | ICD-10-CM

## 2021-06-25 ENCOUNTER — Ambulatory Visit: Payer: 59 | Admitting: Internal Medicine

## 2021-06-25 ENCOUNTER — Encounter: Payer: Self-pay | Admitting: Internal Medicine

## 2021-06-25 ENCOUNTER — Other Ambulatory Visit: Payer: Self-pay

## 2021-06-25 VITALS — BP 130/88 | HR 69 | Ht 63.0 in | Wt 165.2 lb

## 2021-06-25 DIAGNOSIS — E890 Postprocedural hypothyroidism: Secondary | ICD-10-CM

## 2021-06-25 NOTE — Progress Notes (Signed)
Patient ID: Lisa Day, female   DOB: Apr 10, 1976, 45 y.o.   MRN: [id number]   This visit occurred during the SARS-CoV-2 public health emergency.  Safety protocols were in place, including screening questions prior to the visit, additional usage of staff PPE, and extensive cleaning of exam room while observing appropriate contact time as indicated for disinfecting solutions.   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  Last visit 1 year ago.  Interim hx: She has fatigue, dry skin and eyes. Sees ophthalmologist - last OV in 03/2021. On eye drops. No hair loss. Also, constipation. Occasional feet and fingers swelling.  Reviewed and addended history: Pt. 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   She had more nausea and migraines since starting Tirosint.   We changed to Synthroid d.a.w in an effort to minimize adverse effects from the thyroid hormone tablet.  Her nausea resolved after switching to Synthroid 50 mcg tablets.  Previously, she was taking 200 mcg of Synthroid daily, but in 08/2019 she was off the medication!  We started LT4 150 mcg daily (changed to generic 10/2019 due to insurance coverage) her TFTs improved to normal.  However, at last visit, in 05/2020, TSH was again very high...  In 04-12/2020: TSH reportedly suppressed >> dose of LT4 decreased to 100 mcg daily by OBGYN dr. (I was not aware...)    06-02/2021: TSH reportedly normal -however, per check of records from [place] after the visit: TSH was 38.6, very high  Pt is on levothyroxine 100 mcg daily, taken: - in am - fasting - at least 30 min from b'fast - no calcium - no iron - no multivitamins - no PPIs - not on Biotin; on B12  In 08/2019, as she could not  refill her Synthroid due to an absence of 2 years from the office, she was off the medication for 2 weeks.  The TSH returned very high, in the 80s, and she was feeling very poorly: Fatigue, hair loss, weight gain, dry skin.  These have all improved afterwards.    Reviewed her TFTs: 02/12/2021: TSH 38.6 Lab Results  Component Value Date   TSH 36.800 (H) 06/19/2020   TSH 0.605 12/13/2019   TSH 80.34 (H) 09/17/2019   TSH 0.03 (L) 09/07/2018   TSH 12.63 (A) 06/04/2018   TSH 0.41 09/08/2017   TSH 9.43 (H) 03/09/2017   TSH 19.70 (H) 11/07/2016   TSH 10.06 (H) 09/19/2016   TSH 0.701 03/16/2011   FREET4 0.91 06/19/2020   FREET4 1.70 12/13/2019   FREET4 0.00 Repeated and verified X2. (L) 09/17/2019   FREET4 1.58 09/14/2018   FREET4 1.71 (H) 09/08/2017   FREET4 0.75 03/09/2017   FREET4 0.50 (L) 11/07/2016   FREET4 0.70 09/19/2016  08/03/2016: TSH 22.47 06/20/2016: TSH 8.52  05/03/2016: TSH 47.3, free T4 0.28, free T3 2.36 05/25/2008: TSH 0.01, free T4 4.58   Pt denies: - feeling nodules in neck - hoarseness - dysphagia - choking - SOB with lying down  No FH of thyroid cancer. No h/o radiation tx to head or neck.  No herbal supplements. No Biotin use. No recent steroids use.  She also has a history of asthma, headaches, fibromyalgia-previously on Cymbalta, amitriptyline.  Prev. On B12 injections, now on p.o.  B12.  ROS: + see HPI  + HA s - chronic  I reviewed pt's medications, allergies, PMH, social hx, family hx, and changes were documented in the history of present illness. Otherwise, unchanged from my initial visit note.  Past Medical History:  Diagnosis Date   Allergic rhinitis    Anxiety and depression    Asthma    Fibromyalgia    Grave's disease    Headache 07/02/2014   Past Surgical History:  Procedure Laterality Date   APPENDECTOMY  2001   CESAREAN SECTION  04/20/2011   Procedure: CESAREAN SECTION;  Surgeon: Jeani Hawking, MD;  Location: [facility];  Service:  Gynecology;  Laterality: N/A;  Primary Cesarean Section with Delivery Baby Boy @ 0421   CHOLECYSTECTOMY  2006   HIP ARTHROSCOPY     Social History   Social History   Marital status: Single    Spouse name: N/A   Number of children: 2   Occupational History   research    Social History Main Topics   Smoking status: Never Smoker   Smokeless tobacco: Never Used   Alcohol use No   Drug use: No   Current Outpatient Medications on File Prior to Visit  Medication Sig Dispense Refill   acetaminophen (TYLENOL) 325 MG tablet Take 650 mg by mouth every 6 (six) hours as needed for moderate pain or headache.     albuterol (PROVENTIL HFA;VENTOLIN HFA) 108 (90 Base) MCG/ACT inhaler Inhale 2 puffs into the lungs every 4 (four) hours as needed for wheezing or shortness of breath. 1 Inhaler 2   beclomethasone (QVAR) 80 MCG/ACT inhaler Inhale 2 puffs into the lungs 2 (two) times daily. (Patient taking differently: Inhale 1 puff into the lungs 2 (two) times daily. ) 1 Inhaler 5   cyanocobalamin (,VITAMIN B-12,) 1000 MCG/ML injection 1 ml IM daily x 7 days then 1 ml IM weekly x 4 weeks then 1 ml monthly x 1 year 30 mL 0   cyclobenzaprine (FLEXERIL) 10 MG tablet Take 10 mg by mouth 3 (three) times daily as needed for muscle spasms.     DULoxetine (CYMBALTA) 20 MG capsule 1 CAPSULE ONCE DAILY FOR 2 WEEKS, THEN TWICE DAILY AFTER THAT AS DIRECTED ORALLY 30 DAY(S)  11   EPINEPHrine 0.3 mg/0.3 mL IJ SOAJ injection USE AS DIRECTED FOR SEVERE ALLERGIC REACTION. 2 Device 2   fluticasone (FLONASE) 50 MCG/ACT nasal spray Place 2 sprays into both nostrils at bedtime. 16 g 5   levocetirizine (XYZAL) 5 MG tablet Take 1 tablet (5 mg total) by mouth every evening. 30 tablet 5   levonorgestrel (MIRENA) 20 MCG/24HR IUD 1 each by Intrauterine route once.     levothyroxine (SYNTHROID) 150 MCG tablet Take 1 tablet (150 mcg total) by mouth daily. 90 tablet 3   montelukast (SINGULAIR) 10 MG tablet Take 1 tablet (10 mg total) by  mouth at bedtime. 30 tablet 0   Syringe/Needle, Disp, (SYRINGE 3CC/25GX1") 25G X 1" 3 ML MISC 1 Package by Does not apply route See admin instructions. 50 each 0   No current facility-administered medications on file prior to visit.   Allergies  Allergen Reactions   Shrimp [Shellfish Allergy] Hives, Shortness Of Breath and Swelling   Watermelon Concentrate Hives, Shortness Of Breath and Swelling   Gadolinium Derivatives Nausea And Vomiting   Latex Rash   Family History  Problem Relation Age of Onset   Hypertension Mother    Breast cancer Maternal Grandmother    Allergic rhinitis  Father    Asthma Father    Migraines Brother    Allergic rhinitis Brother    Migraines Sister    Angioedema Neg Hx    Eczema Neg Hx    Immunodeficiency Neg Hx    Urticaria Neg Hx    PE: BP 130/88 (BP Location: Right Arm, Patient Position: Sitting, Cuff Size: Normal)   Pulse 69   Ht 5\' 3"  (1.6 m)   Wt 165 lb 3.2 oz (74.9 kg)   SpO2 98%   BMI 29.26 kg/m  Wt Readings from Last 3 Encounters:  06/25/21 165 lb 3.2 oz (74.9 kg)  06/19/20 173 lb 6.4 oz (78.7 kg)  12/13/19 170 lb (77.1 kg)   Constitutional: overweight, in NAD Eyes: PERRLA, EOMI, no exophthalmos ENT: moist mucous membranes, no thyromegaly, no cervical lymphadenopathy Cardiovascular: RRR, No MRG Respiratory: CTA B Gastrointestinal: abdomen soft, NT, ND, BS+ Musculoskeletal: no deformities, strength intact in all 4 Skin: moist, warm, no rashes Neurological: no tremor with outstretched hands, DTR normal in all 4  ASSESSMENT: 1. Postablative Hypothyroidism  PLAN:  1. Patient with longstanding, uncontrolled, hypothyroidism, with history of being lost for follow-up for 2 years before our visit in 08/2019, during which she ran out levothyroxine.  At that time she was on 200 mcg daily.  A TSH obtained afterwards was very high, at 80.  We restarted levothyroxine, lower dose, 150 mcg daily, and her TFTs normalized she continues on this dose  now.  Of note, in the past we had to use 50 mcg tablets due to lack of coloring agents, however, she was tolerating the 150 mcg tablet well.  As of now, she is on 100 mcg tablet (see below), which she is also tolerating well.. - thyroid labs from last visit reviewed with pt. >> very high: Lab Results  Component Value Date   TSH 36.800 (H) 06/19/2020  - At last visit we did not change the dose of levothyroxine as I suspected that she missed doses.  I checked with her, but she did not get in touch with me or return for labs. -She tells me that she had labs with her OB/GYN doctor last spring and the TSH was suppressed so her levothyroxine dose was reduced from 150 to 100 mcg daily.  I was not aware of this change.  Reportedly TFTs were normal afterwards, however, after the visit, I was able to check her records from [place] and the TSH on 02/12/2021 was very high, at 38.6. -On the current dose, she feels that she is retaining fluid, also complains of dry eyes and constipation.  No weight gain, in fact, she lost 8 pounds since our last in person visit. - we discussed about taking the thyroid hormone every day, with water, >30 minutes before breakfast, separated by >4 hours from acid reflux medications, calcium, iron, multivitamins. Pt. is taking it correctly.  She did not miss doses in the last month.  I advised her that if this happens, she can take to the next day. - will check thyroid tests today: TSH and fT4 - If labs are abnormal, she will need to return for repeat TFTs in 1.5 months - RTC in 6 mo  Needs refills.  Component     Latest Ref Rng & Units 06/25/2021  T4,Free(Direct)     0.82 - 1.77 ng/dL 5.36  TSH     4.680 - 3.212 uIU/mL 30.100 (H)  At this point, we will increase her levothyroxine dose to 125 mcg daily  and have her back for labs in 1.5 months.  Carlus Pavlov, MD PhD [facility]

## 2021-06-25 NOTE — Patient Instructions (Addendum)
Please continue Levothyroxine 100 mcg daily.  Take the thyroid hormone every day, with water, at least 30 minutes before breakfast, separated by at least 4 hours from: - acid reflux medications - calcium - iron - multivitamins  Please stop at the lab.  Please come back for a follow-up appointment in 6 months. 

## 2021-06-26 LAB — T4, FREE: Free T4: 0.83 ng/dL (ref 0.82–1.77)

## 2021-06-26 LAB — TSH: TSH: 30.1 u[IU]/mL — ABNORMAL HIGH (ref 0.450–4.500)

## 2021-06-28 MED ORDER — LEVOTHYROXINE SODIUM 125 MCG PO TABS: 125.0000 ug | ORAL_TABLET | Freq: Every day | ORAL | 3 refills | Status: DC

## 2021-09-13 ENCOUNTER — Telehealth: Payer: Self-pay | Admitting: Family Medicine

## 2021-09-13 ENCOUNTER — Telehealth: Payer: Self-pay | Admitting: Internal Medicine

## 2021-09-13 NOTE — Telephone Encounter (Signed)
Pt is calling in stating that she would like to have her most recent lab work for the thyroids.  This information should be sent to Dr. Lorina Rabon [phone] (F)  [phone] (P).

## 2021-09-13 NOTE — Telephone Encounter (Signed)
Error

## 2021-09-14 NOTE — Telephone Encounter (Signed)
Labs routed to Dr. Lorina Rabon [phone] via [place].

## 2021-09-16 ENCOUNTER — Other Ambulatory Visit (INDEPENDENT_AMBULATORY_CARE_PROVIDER_SITE_OTHER): Payer: 59

## 2021-09-16 ENCOUNTER — Other Ambulatory Visit: Payer: Self-pay

## 2021-09-16 DIAGNOSIS — E890 Postprocedural hypothyroidism: Secondary | ICD-10-CM | POA: Diagnosis not present

## 2021-09-16 LAB — T4, FREE: Free T4: 1.07 ng/dL (ref 0.60–1.60)

## 2021-09-16 LAB — TSH: TSH: 1.99 u[IU]/mL (ref 0.35–5.50)

## 2021-11-11 ENCOUNTER — Other Ambulatory Visit: Payer: Self-pay | Admitting: Internal Medicine

## 2021-12-23 ENCOUNTER — Ambulatory Visit: Payer: 59 | Admitting: Internal Medicine

## 2021-12-24 ENCOUNTER — Encounter: Payer: Self-pay | Admitting: Internal Medicine

## 2021-12-24 ENCOUNTER — Ambulatory Visit: Payer: 59 | Admitting: Internal Medicine

## 2021-12-24 VITALS — BP 128/82 | HR 80 | Ht 63.0 in | Wt 177.8 lb

## 2021-12-24 DIAGNOSIS — E890 Postprocedural hypothyroidism: Secondary | ICD-10-CM

## 2021-12-24 LAB — T4, FREE: Free T4: 0.76 ng/dL (ref 0.60–1.60)

## 2021-12-24 LAB — TSH: TSH: 2.45 u[IU]/mL (ref 0.35–5.50)

## 2021-12-24 MED ORDER — LEVOTHYROXINE SODIUM 125 MCG PO TABS: 125.0000 ug | ORAL_TABLET | Freq: Every day | ORAL | 3 refills | Status: DC

## 2021-12-24 NOTE — Progress Notes (Signed)
Patient ID: Lisa Day, female   DOB: Feb 16, 1976, 46 y.o.   MRN: [id number]  ? ?This visit occurred during the SARS-CoV-2 public health emergency.  Safety protocols were in place, including screening questions prior to the visit, additional usage of staff PPE, and extensive cleaning of exam room while observing appropriate contact time as indicated for disinfecting solutions.  ? 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  Last visit 6 months ago. ? ?Interim hx: ?She has fatigue, constipation, dry skin and eyes - on eye drops.  She has blurry and double vision and since last visit she saw Dr. Dimas Millin. She will start Tepezza. ?Improved hair loss.  + weight gain. She still has HAs. ?At this visit she does not that she had an episode of chest pain and saw her PCP few weeks ago.  The chest pain was deemed costochondral and she was given a prednisone taper. ?She also describes palpitations approximately 1-2 times a week. ? ?Reviewed and addended history: ?Pt. has been dx with Cotroneo' disease in 2006 and developed post-ablative hypothyroidism after RAI treatment >> on Levothyroxine 175 mcg >> now on Tirosint 150 mcg + added 25 mcg daily in 08/2016. ? ? She had more nausea and migraines since starting Tirosint. ? ? We changed to Synthroid d.a.w in an effort to minimize adverse effects from the thyroid hormone tablet. ? ?Her nausea resolved after switching to Synthroid 50 mcg tablets. ? ?Previously, she was taking 200 mcg of Synthroid daily, but in 08/2019 she could not refill the medication due to an absence of 2 years from the office, so she was off the medication for 2 weeks.  The TSH returned very high, in the 80s, and she was feeling very poorly: Fatigue, hair loss, weight gain, dry skin.  These have all improved afterwards.   ? ?We started LT4 150 mcg daily (changed to generic 10/2019 due to insurance coverage)  her TFTs improved to normal. ? ?However, at last visit, in 05/2020, TSH was again very high... ? ?In 04-12/2020: TSH reportedly suppressed >> dose of LT4 decreased to 100 mcg daily by OBGYN dr. (I was not aware...) ?  ? 06-02/2021: TSH reportedly normal -however, per check of records from [place] after the visit: TSH was 38.6, very high ? ?In 06/2021, TSH was still elevated, at 30.1, so increased her LT4 dose to 125 mcg daily. ? ?Pt is currently on levothyroxine 125 mcg daily, taken: ?- in am ?- fasting ?- at least 30 min from b'fast ?- no calcium ?- no iron ?- no multivitamins ?- no PPIs ?- not on Biotin; on B12 ? ?Reviewed her TFTs: ?Lab Results  ?Component Value Date  ? TSH 1.99 09/16/2021  ? TSH 30.100 (H) 06/25/2021  ? TSH 36.800 (H) 06/19/2020  ? TSH 0.605 12/13/2019  ? TSH 80.34 (H) 09/17/2019  ? TSH 0.03 (L) 09/07/2018  ? TSH 12.63 (A) 06/04/2018  ? TSH 0.41 09/08/2017  ? TSH 9.43 (H) 03/09/2017  ? TSH 19.70 (H) 11/07/2016  ? FREET4 1.07 09/16/2021  ? FREET4 0.83 06/25/2021  ? FREET4 0.91 06/19/2020  ? FREET4 1.70 12/13/2019  ? FREET4 0.00 Repeated and verified X2. (L) 09/17/2019  ? FREET4 1.58 09/14/2018  ? FREET4 1.71 (H) 09/08/2017  ? FREET4 0.75 03/09/2017  ? FREET4 0.50 (L) 11/07/2016  ? FREET4 0.70 09/19/2016  ?02/12/2021: TSH 38.6 ?08/03/2016: TSH 22.47 ?06/20/2016: TSH 8.52  ?05/03/2016:  TSH 47.3, free T4 0.28, free T3 2.36 ?05/25/2008: TSH 0.01, free T4 4.58  ? ?Pt denies: ?- feeling nodules in neck ?- hoarseness ?- dysphagia ?- choking ?- SOB with lying down ? ?No FH of thyroid cancer. No h/o radiation tx to head or neck. ?No herbal supplements. No Biotin use. + recent steroids use. ? ?She also has a history of asthma, headaches, fibromyalgia-previously on Cymbalta, amitriptyline. ?Prev. On B12 injections, now on p.o. B12. ? ?ROS: ?+ see HPI  ?+ HA s - chronic ? ?I reviewed pt's medications, allergies, PMH, social hx, family hx, and changes were documented in the history of present illness.  Otherwise, unchanged from my initial visit note. ? ?Past Medical History:  ?Diagnosis Date  ? Allergic rhinitis   ? Anxiety and depression   ? Asthma   ? Fibromyalgia   ? Grave's disease   ? Headache 07/02/2014  ? ?Past Surgical History:  ?Procedure Laterality Date  ? APPENDECTOMY  2001  ? CESAREAN SECTION  04/20/2011  ? Procedure: CESAREAN SECTION;  Surgeon: Cyril Mourning, MD;  Location: [facility];  Service: Gynecology;  Laterality: N/A;  Primary Cesarean Section with Delivery Baby Boy @ [phone]  ? CHOLECYSTECTOMY  2006  ? HIP ARTHROSCOPY    ? ?Social History  ? ?Social History  ? Marital status: Single  ?  Spouse name: N/A  ? Number of children: 2  ? 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 ?Social History Main Topics  ? Smoking status: Never Smoker  ? Smokeless tobacco: Never Used  ? Alcohol use No  ? Drug use: No  ? ?Current Outpatient Medications on File Prior to Visit  ?Medication Sig Dispense Refill  ? acetaminophen (TYLENOL) 325 MG tablet Take 650 mg by mouth every 6 (six) hours as needed for moderate pain or headache.    ? albuterol (PROVENTIL HFA;VENTOLIN HFA) 108 (90 Base) MCG/ACT inhaler Inhale 2 puffs into the lungs every 4 (four) hours as needed for wheezing or shortness of breath. 1 Inhaler 2  ? beclomethasone (QVAR) 80 MCG/ACT inhaler Inhale 2 puffs into the lungs 2 (two) times daily. (Patient taking differently: Inhale 1 puff into the lungs 2 (two) times daily.) 1 Inhaler 5  ? cyanocobalamin (,VITAMIN B-12,) 1000 MCG/ML injection 1 ml IM daily x 7 days then 1 ml IM weekly x 4 weeks then 1 ml monthly x 1 year 30 mL 0  ? cyclobenzaprine (FLEXERIL) 10 MG tablet Take 10 mg by mouth 3 (three) times daily as needed for muscle spasms.    ? DULoxetine (CYMBALTA) 20 MG capsule 1 CAPSULE ONCE DAILY FOR 2 WEEKS, THEN TWICE DAILY AFTER THAT AS DIRECTED ORALLY 30 DAY(S)  11  ? EPINEPHrine 0.3 mg/0.3 mL IJ SOAJ injection USE AS DIRECTED FOR SEVERE ALLERGIC REACTION. 2 Device 2  ? fluticasone (FLONASE) 50 MCG/ACT nasal  spray Place 2 sprays into both nostrils at bedtime. 16 g 5  ? levocetirizine (XYZAL) 5 MG tablet Take 1 tablet (5 mg total) by mouth every evening. 30 tablet 5  ? levonorgestrel (MIRENA) 20 MCG/24HR IUD 1 each by Intrauterine route once.    ? levothyroxine (SYNTHROID) 125 MCG tablet Take 1 tablet (125 mcg total) by mouth daily. 45 tablet 3  ? montelukast (SINGULAIR) 10 MG tablet Take 1 tablet (10 mg total) by mouth at bedtime. 30 tablet 0  ? Syringe/Needle, Disp, (SYRINGE 3CC/25GX1") 25G X 1" 3 ML MISC 1 Package by Does not apply route See admin instructions. [place]  each 0  ? ?No current facility-administered medications on file prior to visit.  ? ?Allergies  ?Allergen Reactions  ? Shrimp [Shellfish Allergy] Hives, Shortness Of Breath and Swelling  ? Watermelon Concentrate Hives, Shortness Of Breath and Swelling  ? Gadolinium Derivatives Nausea And Vomiting  ? Latex Rash  ? ?Family History  ?Problem Relation Age of Onset  ? Hypertension Mother   ? Breast cancer Maternal Grandmother   ? Allergic rhinitis Father   ? Asthma Father   ? Migraines Brother   ? Allergic rhinitis Brother   ? Migraines Sister   ? Angioedema Neg Hx   ? Eczema Neg Hx   ? Immunodeficiency Neg Hx   ? Urticaria Neg Hx   ? ?PE: ?BP 128/82 (BP Location: Left Arm, Patient Position: Sitting, Cuff Size: Normal)   Pulse 80   Ht 5\' 3"  (1.6 m)   Wt 177 lb 12.8 oz (80.6 kg)   SpO2 99%   BMI 31.50 kg/m?  ?Wt Readings from Last 3 Encounters:  ?12/24/21 177 lb 12.8 oz (80.6 kg)  ?06/25/21 165 lb 3.2 oz (74.9 kg)  ?06/19/20 173 lb 6.4 oz (78.7 kg)  ? ?Constitutional: overweight, in NAD ?Eyes: PERRLA, EOMI, + exophthalmos ?ENT: moist mucous membranes, no thyromegaly, no cervical lymphadenopathy ?Cardiovascular: RRR, No MRG ?Respiratory: CTA B ?Musculoskeletal: no deformities, strength intact in all 4 ?Skin: moist, warm, no rashes ?Neurological: no tremor with outstretched hands, DTR normal in all 4 ? ?ASSESSMENT: ?1. Postablative Hypothyroidism ? ?2.   Palpitations ? ?PLAN:  ?1. Patient with longstanding, uncontrolled, hypothyroidism, with history of being lost for follow-up for 2 years before our visit in 08/2019, during which she ran out of levothyroxine.  At that time s

## 2021-12-24 NOTE — Patient Instructions (Addendum)
Please continue Levothyroxine 125 mcg daily.  Take the thyroid hormone every day, with water, at least 30 minutes before breakfast, separated by at least 4 hours from: - acid reflux medications - calcium - iron - multivitamins  Please stop at the lab.  Please come back for a follow-up appointment in 6 months.    

## 2022-01-06 ENCOUNTER — Telehealth: Payer: Self-pay

## 2022-01-06 NOTE — Telephone Encounter (Signed)
Pt lvm requesting a refill on Levothyroxine medication.

## 2022-01-06 NOTE — Telephone Encounter (Signed)
Called and lvm for pt advising rx was sent 12/24/21. Request pt call back if rx needs to be sent to different pharmacy.

## 2022-02-18 ENCOUNTER — Encounter: Payer: Self-pay | Admitting: Internal Medicine

## 2022-02-18 DIAGNOSIS — E890 Postprocedural hypothyroidism: Secondary | ICD-10-CM

## 2022-02-18 MED ORDER — LEVOTHYROXINE SODIUM 125 MCG PO TABS: 125.0000 ug | ORAL_TABLET | Freq: Every day | ORAL | 3 refills | Status: DC

## 2022-02-25 ENCOUNTER — Other Ambulatory Visit: Payer: Self-pay

## 2022-02-25 DIAGNOSIS — E890 Postprocedural hypothyroidism: Secondary | ICD-10-CM

## 2022-02-25 MED ORDER — LEVOTHYROXINE SODIUM 125 MCG PO TABS: 125.0000 ug | ORAL_TABLET | Freq: Every day | ORAL | 3 refills | Status: DC

## 2022-06-28 ENCOUNTER — Encounter: Payer: Self-pay | Admitting: Internal Medicine

## 2022-06-28 ENCOUNTER — Ambulatory Visit: Payer: 59 | Admitting: Internal Medicine

## 2022-06-28 VITALS — BP 110/78 | HR 80 | Ht 63.0 in | Wt 168.6 lb

## 2022-06-28 DIAGNOSIS — E890 Postprocedural hypothyroidism: Secondary | ICD-10-CM | POA: Diagnosis not present

## 2022-06-28 DIAGNOSIS — Z8639 Personal history of other endocrine, nutritional and metabolic disease: Secondary | ICD-10-CM

## 2022-06-28 NOTE — Patient Instructions (Addendum)
Please continue Levothyroxine 125 mcg daily.  Take the thyroid hormone every day, with water, at least 30 minutes before breakfast, separated by at least 4 hours from: - acid reflux medications - calcium - iron - multivitamins  Please stop at the lab.  Please come back for a follow-up appointment in 1 year but labs at 6 months.

## 2022-06-28 NOTE — Progress Notes (Signed)
Patient ID: Lisa Day, female   DOB: 05-13-1976, 46 y.o.   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  Last visit 6 months ago.  Interim hx: Her fatigue has resolved, still having some constipation, still has dry skin and eyes.  Before last visit she had blurry and double vision and saw Dr. Lorina Rabon.  Plan was to start Netty Starring -she was reticent to start yet. At last visit she had weight gain -was able to lose 8 pounds since then. She also had palpitations - now only occasionally.  Reviewed history: Pt. has been dx with Colin' disease in 2006 and developed post-ablative hypothyroidism after RAI treatment >> on Levothyroxine 175 mcg >> now on Tirosint 150 mcg + added 25 mcg daily in 08/2016.   She had more nausea and migraines since starting Tirosint.   We changed to Synthroid d.a.w in an effort to minimize adverse effects from the thyroid hormone tablet.  Her nausea resolved after switching to Synthroid 50 mcg tablets.  Previously, she was taking 200 mcg of Synthroid daily, but in 08/2019 she could not refill the medication due to an absence of 2 years from the office, so she was off the medication for 2 weeks.  The TSH returned very high, in the 80s, and she was feeling very poorly: Fatigue, hair loss, weight gain, dry skin.  These have all improved afterwards.    We started LT4 150 mcg daily (changed to generic 10/2019 due to insurance coverage) her TFTs improved to normal.  However, at last visit, in 05/2020, TSH was again very high...  In 04-12/2020: TSH reportedly suppressed >> dose of LT4 decreased to 100 mcg daily by OBGYN dr. (I was not aware...)    06-02/2021: TSH reportedly normal -however, per check of records from [place] after the visit: TSH was 38.6, very high  In 06/2021, TSH was still elevated, at 30.1, so increased her LT4 dose to 125 mcg  daily.  Pt is currently on levothyroxine 125 mcg daily, taken: - in am - fasting - at least 30 min from b'fast - no calcium - no iron - no multivitamins - no PPIs - not on Biotin; on B12  Reviewed her TFTs: Lab Results  Component Value Date   TSH 2.45 12/24/2021   TSH 1.99 09/16/2021   TSH 30.100 (H) 06/25/2021   TSH 36.800 (H) 06/19/2020   TSH 0.605 12/13/2019   TSH 80.34 (H) 09/17/2019   TSH 0.03 (L) 09/07/2018   TSH 12.63 (A) 06/04/2018   TSH 0.41 09/08/2017   TSH 9.43 (H) 03/09/2017   FREET4 0.76 12/24/2021   FREET4 1.07 09/16/2021   FREET4 0.83 06/25/2021   FREET4 0.91 06/19/2020   FREET4 1.70 12/13/2019   FREET4 0.00 Repeated and verified X2. (L) 09/17/2019   FREET4 1.58 09/14/2018   FREET4 1.71 (H) 09/08/2017   FREET4 0.75 03/09/2017   FREET4 0.50 (L) 11/07/2016  02/12/2021: TSH 38.6 08/03/2016: TSH 22.47 06/20/2016: TSH 8.52  05/03/2016: TSH 47.3, free T4 0.28, free T3 2.36 05/25/2008: TSH 0.01, free T4 4.58   No results found for: "TSI"  Pt denies: - feeling nodules in neck - hoarseness - dysphagia - choking  No FH of thyroid cancer. No h/o radiation tx to head or neck. No herbal supplements. No Biotin use. + recent steroids use.  She also has a history of asthma, headaches, fibromyalgia-previously on  Cymbalta, amitriptyline. Prev. On B12 injections, now on p.o. B12.  ROS: + see HPI   I reviewed pt's medications, allergies, PMH, social hx, family hx, and changes were documented in the history of present illness. Otherwise, unchanged from my initial visit note.  Past Medical History:  Diagnosis Date   Allergic rhinitis    Anxiety and depression    Asthma    Fibromyalgia    Grave's disease    Headache 07/02/2014   Past Surgical History:  Procedure Laterality Date   APPENDECTOMY  2001   CESAREAN SECTION  04/20/2011   Procedure: CESAREAN SECTION;  Surgeon: Cyril Mourning, MD;  Location: [facility];  Service: Gynecology;  Laterality: N/A;   Primary Cesarean Section with Delivery Baby Boy @ [facility]  2006   HIP ARTHROSCOPY     Social History   Social History   Marital status: Single    Spouse name: N/A   Number of children: 2   Occupational History   research    Social History Main Topics   Smoking status: Never Smoker   Smokeless tobacco: Never Used   Alcohol use No   Drug use: No   Current Outpatient Medications on File Prior to Visit  Medication Sig Dispense Refill   acetaminophen (TYLENOL) 325 MG tablet Take 650 mg by mouth every 6 (six) hours as needed for moderate pain or headache.     albuterol (PROVENTIL HFA;VENTOLIN HFA) 108 (90 Base) MCG/ACT inhaler Inhale 2 puffs into the lungs every 4 (four) hours as needed for wheezing or shortness of breath. 1 Inhaler 2   beclomethasone (QVAR) 80 MCG/ACT inhaler Inhale 2 puffs into the lungs 2 (two) times daily. (Patient taking differently: Inhale 1 puff into the lungs 2 (two) times daily.) 1 Inhaler 5   cyclobenzaprine (FLEXERIL) 10 MG tablet Take 10 mg by mouth 3 (three) times daily as needed for muscle spasms.     EPINEPHrine 0.3 mg/0.3 mL IJ SOAJ injection USE AS DIRECTED FOR SEVERE ALLERGIC REACTION. 2 Device 2   fluticasone (FLONASE) 50 MCG/ACT nasal spray Place 2 sprays into both nostrils at bedtime. 16 g 5   levocetirizine (XYZAL) 5 MG tablet Take 1 tablet (5 mg total) by mouth every evening. 30 tablet 5   levonorgestrel (MIRENA) 20 MCG/24HR IUD 1 each by Intrauterine route once.     levothyroxine (SYNTHROID) 125 MCG tablet Take 1 tablet (125 mcg total) by mouth daily before breakfast. 90 tablet 3   montelukast (SINGULAIR) 10 MG tablet Take 1 tablet (10 mg total) by mouth at bedtime. 30 tablet 0   No current facility-administered medications on file prior to visit.   Allergies  Allergen Reactions   Shrimp [Shellfish Allergy] Hives, Shortness Of Breath and Swelling   Watermelon Concentrate Hives, Shortness Of Breath and Swelling   Gadolinium  Derivatives Nausea And Vomiting   Latex Rash   Family History  Problem Relation Age of Onset   Hypertension Mother    Breast cancer Maternal Grandmother    Allergic rhinitis Father    Asthma Father    Migraines Brother    Allergic rhinitis Brother    Migraines Sister    Angioedema Neg Hx    Eczema Neg Hx    Immunodeficiency Neg Hx    Urticaria Neg Hx    PE: BP 110/78 (BP Location: Left Arm, Patient Position: Sitting, Cuff Size: Normal)   Pulse 80   Ht 5\' 3"  (1.6 m)   Wt 168 lb 9.6  oz (76.5 kg)   SpO2 99%   BMI 29.87 kg/m  Wt Readings from Last 3 Encounters:  06/28/22 168 lb 9.6 oz (76.5 kg)  12/24/21 177 lb 12.8 oz (80.6 kg)  06/25/21 165 lb 3.2 oz (74.9 kg)   Constitutional: overweight, in NAD Eyes:  EOMI, + exophthalmos; swelling of upper and lower eyelids bilaterally. ENT: no neck masses, no cervical lymphadenopathy Cardiovascular: RRR, No MRG Respiratory: CTA B Musculoskeletal: no deformities Skin:no rashes Neurological: no tremor with outstretched hands  ASSESSMENT: 1. Postablative Hypothyroidism  2.  H/o [place] ds.  PLAN:  1. Patient with longstanding, uncontrolled, hypothyroidism, with h/o being lost for follow-up for 2 years before her visit in 08/2019, during which she ran out of levothyroxine.  At that time she was on 200 mcg a day.  A TSH obtained at that time was very high, at 80.  We restarted levothyroxine at the lower dose and her TFTs normalized.  However, afterwards, we were able to decrease the dose further, currently on 125 mcg daily. - latest thyroid labs reviewed with pt. >> normal: Lab Results  Component Value Date   TSH 2.45 12/24/2021  - she continues on LT4 125 mcg daily - pt feels good on this dose.  Before last visit she gained 12 pounds and she had palpitations.  At this visit, palpitations resolved, and she lost 8 pounds. - we discussed about taking the thyroid hormone every day, with water, >30 minutes before breakfast, separated by >4  hours from acid reflux medications, calcium, iron, multivitamins. Pt. is taking it correctly. - will check thyroid tests today: TSH and fT4 - If labs are abnormal, she will need to return for repeat TFTs in 1.5 months - I we will see her back in a year but with labs in 6 months.  2.  H/o [place] ds. -Patient with a history of Silber' disease with persistent Twiggs' ophthalmopathy -she continues to complain of dry eyes, and at today's exam she has palpebral swelling. -She is seeing Dr. Lorina Rabon with ophthalmology.  She is planning to start Tepezza, but would like to find out more information about this.  She is planning to attend a seminar. -At today's visit, we will recheck her TSI's  Component     Latest Ref Rng 06/28/2022  TSH     0.35 - 5.50 uIU/mL 1.01   T4,Free(Direct)     0.60 - 1.60 ng/dL 1.35   TSI     <140 % baseline 97    Normal labs.  Philemon Kingdom, MD PhD [facility]

## 2022-06-29 LAB — TSH: TSH: 1.01 u[IU]/mL (ref 0.35–5.50)

## 2022-06-29 LAB — T4, FREE: Free T4: 1.35 ng/dL (ref 0.60–1.60)

## 2022-06-30 LAB — THYROID STIMULATING IMMUNOGLOBULIN: TSI: 97 % baseline (ref <140)

## 2022-11-04 ENCOUNTER — Other Ambulatory Visit: Payer: Self-pay | Admitting: Family

## 2022-11-04 DIAGNOSIS — N6310 Unspecified lump in the right breast, unspecified quadrant: Secondary | ICD-10-CM

## 2022-11-04 DIAGNOSIS — N6452 Nipple discharge: Secondary | ICD-10-CM

## 2022-11-14 ENCOUNTER — Other Ambulatory Visit: Payer: Self-pay | Admitting: Family

## 2022-11-14 ENCOUNTER — Ambulatory Visit
Admission: RE | Admit: 2022-11-14 | Discharge: 2022-11-14 | Disposition: A | Discharge status: Home / Self Care | Payer: 59 | Source: Ambulatory Visit | Attending: Family | Admitting: Family

## 2022-11-14 DIAGNOSIS — N6310 Unspecified lump in the right breast, unspecified quadrant: Secondary | ICD-10-CM

## 2022-11-14 DIAGNOSIS — N6452 Nipple discharge: Secondary | ICD-10-CM

## 2022-11-17 ENCOUNTER — Ambulatory Visit
Admission: RE | Admit: 2022-11-17 | Discharge: 2022-11-17 | Disposition: A | Discharge status: Home / Self Care | Payer: 59 | Source: Ambulatory Visit | Attending: Family | Admitting: Family

## 2022-11-17 DIAGNOSIS — N6310 Unspecified lump in the right breast, unspecified quadrant: Secondary | ICD-10-CM

## 2022-11-17 HISTORY — PX: BREAST BIOPSY: SHX20

## 2022-12-17 ENCOUNTER — Other Ambulatory Visit: Payer: Self-pay | Admitting: Family

## 2022-12-17 DIAGNOSIS — N6310 Unspecified lump in the right breast, unspecified quadrant: Secondary | ICD-10-CM

## 2022-12-27 ENCOUNTER — Other Ambulatory Visit (INDEPENDENT_AMBULATORY_CARE_PROVIDER_SITE_OTHER): Payer: 59

## 2022-12-27 DIAGNOSIS — E890 Postprocedural hypothyroidism: Secondary | ICD-10-CM | POA: Diagnosis not present

## 2022-12-28 ENCOUNTER — Other Ambulatory Visit: Payer: Self-pay | Admitting: Internal Medicine

## 2022-12-28 DIAGNOSIS — E890 Postprocedural hypothyroidism: Secondary | ICD-10-CM

## 2022-12-28 LAB — T4, FREE: Free T4: 1.39 ng/dL (ref 0.60–1.60)

## 2022-12-28 LAB — TSH: TSH: 0.2 u[IU]/mL — ABNORMAL LOW (ref 0.35–5.50)

## 2022-12-28 MED ORDER — LEVOTHYROXINE SODIUM 112 MCG PO TABS: 112.0000 ug | ORAL_TABLET | Freq: Every day | ORAL | 3 refills | Status: DC

## 2023-01-09 ENCOUNTER — Ambulatory Visit
Admission: RE | Admit: 2023-01-09 | Discharge: 2023-01-09 | Disposition: A | Discharge status: Home / Self Care | Payer: 59 | Source: Ambulatory Visit | Attending: Family | Admitting: Family

## 2023-01-09 DIAGNOSIS — N6310 Unspecified lump in the right breast, unspecified quadrant: Secondary | ICD-10-CM

## 2023-01-09 MED ORDER — GADOPICLENOL 0.5 MMOL/ML IV SOLN
7.5000 mL | Freq: Once | INTRAVENOUS | Status: AC | PRN
  Administered 2023-01-09: 7.5 mL via INTRAVENOUS

## 2023-01-11 ENCOUNTER — Other Ambulatory Visit: Payer: Self-pay | Admitting: Family

## 2023-01-11 DIAGNOSIS — R9389 Abnormal findings on diagnostic imaging of other specified body structures: Secondary | ICD-10-CM

## 2023-01-17 ENCOUNTER — Other Ambulatory Visit: Payer: 59

## 2023-01-18 ENCOUNTER — Other Ambulatory Visit: Payer: 59

## 2023-01-27 ENCOUNTER — Ambulatory Visit
Admission: RE | Admit: 2023-01-27 | Discharge: 2023-01-27 | Disposition: A | Discharge status: Home / Self Care | Payer: 59 | Source: Ambulatory Visit | Attending: Family | Admitting: Family

## 2023-01-27 DIAGNOSIS — R9389 Abnormal findings on diagnostic imaging of other specified body structures: Secondary | ICD-10-CM

## 2023-02-01 ENCOUNTER — Other Ambulatory Visit: Payer: Self-pay

## 2023-02-01 ENCOUNTER — Ambulatory Visit: Payer: 59 | Admitting: Sports Medicine

## 2023-02-01 VITALS — BP 114/70 | Ht 63.0 in | Wt 140.0 lb

## 2023-02-01 DIAGNOSIS — M25512 Pain in left shoulder: Secondary | ICD-10-CM | POA: Diagnosis not present

## 2023-02-01 MED ORDER — METHYLPREDNISOLONE ACETATE 40 MG/ML IJ SUSP
40.0000 mg | Freq: Once | INTRAMUSCULAR | Status: AC
  Administered 2023-02-01: 40 mg via INTRA_ARTICULAR

## 2023-02-01 NOTE — Progress Notes (Signed)
Lisa Day - 47 y.o. female MRN [id number]  Date of birth: [birth year]    CHIEF COMPLAINT:   left shoulder pain    SUBJECTIVE:   HPI: Lisa Day is a 47yo F with no significant pmh who presents to clinic with a one month history of left shoulder pain. The patient states that her pain began unprovoked and describes it as a sharp sensation felt non locally in the left shoulder. Movements such as reaching overhead, doing the dishes, and reaching behind her seem to worsen her symptoms. Two days ago she reports an acute worsening of her pain when trying to reach a cabinet overhead. She describes a burning sensation that radiates to the elbow.She has tried taking Meloxicam, Flexiril, and squeezing an exercise ball without significant relief of her pain. The patient endorses numbness and tingling that radiates down the arm to all five fingers of the left hand.   ROS:     See HPI  PERTINENT  PMH / PSH FH / / SH:  Past Medical, Surgical, Social, and Family History Reviewed & Updated in the EMR.  Pertinent findings include:  Right Hip Degenerative Joint Disease  OBJECTIVE: BP 114/70   Ht 5\' 3"  (1.6 m)   Wt 140 lb (63.5 kg)   BMI 24.80 kg/m   Physical Exam:  Vital signs are reviewed.  GEN: Alert and oriented, NAD Pulm: Breathing unlabored PSY: normal mood, congruent affect  MSK: Left Shoulder - No obvious deformity, swelling, or erythema. Tender to palpation over bicipital groove and trapezius muscle. Non tender to palpation over AC joint. On active ROM testing patient had forward flexion to 100 degrees, abduction to 90 degrees. Full Passive ROM. Full ROM with active and passive external and internal ROM. 4/5 strength with external and internal rotation, with an equal amount of pain noted with both movements. Pain with empty can test, although patient still had strength. Positive O'Brien's test. Positive Hawkins test. Patient no able to position herself for lift-off test. Neurovascularly  intact distally.  Following the description of risks including infection bleeding, damage to surrounding structures, patient provided verbal/written consent for left subacromial bursa corticosteroid injection procedure. Timeout was performed.  Patient was sterilely prepped in the usual fashion with alcohol swab x2. Following topical anesthetization with ethyl chloride the left subacromial bursa was identified through anatomic guidance and injected with a solution of 40mg  Depo-Medrol + 4 cc 1% Lidocaine. Patient tolerated well without complication. Precautions provided.   ASSESSMENT & PLAN:  1. Left Shoulder Pain The most likely etiology of this patient's clinical presentation is left shoulder rotator cuff strain. Physical exam findings of positive empty can test and weakness in the shoulder combined with history of pain with overhead movements are consistent with this diagnosis. It is unusual for a patient to strain the rotator cuff unprovoked however. Other etiologies of her presentation such as adhesive capsulitis were considered but her ROM was greater than what we would expect with this pathology. [facility] Joint pathology was also considered but physical exam was not consistent with this diagnosis. Through shared decision making, the decision was reached to proceed with a cortisone injection into the left shoulder followed by physical therapy. - Cortisone injection given into the left shoulder as detailed in the objective section of this note - Physician directed physical therapy exercises were given to the patient - Continue taking Meloxicam as needed for pain - Follow up in 4-6 weeks. If pain has not improved at time of follow up, consider ultrasound  and formal PT.  Eustaquio Boyden, MS4  Patient was seen and evaluated separate from the medical student.  I agree with his exam, assessment and plan.  Hopeful that she get some relief from the steroid injection and that she can continue with her home exercise  program.  Follow-up in 4 to 6 weeks.  If she has continued pain would consider ultrasound evaluation.  Michelle A. Leonor Liv, DO PGY 4, [facility] Fellow

## 2023-02-21 ENCOUNTER — Encounter: Payer: Self-pay | Admitting: Internal Medicine

## 2023-02-24 ENCOUNTER — Other Ambulatory Visit: Payer: Self-pay | Admitting: Surgery

## 2023-03-06 ENCOUNTER — Encounter (HOSPITAL_BASED_OUTPATIENT_CLINIC_OR_DEPARTMENT_OTHER): Payer: Self-pay | Admitting: Surgery

## 2023-03-06 ENCOUNTER — Other Ambulatory Visit: Payer: Self-pay

## 2023-03-08 ENCOUNTER — Ambulatory Visit: Payer: 59 | Admitting: Sports Medicine

## 2023-03-08 ENCOUNTER — Encounter: Payer: Self-pay | Admitting: Sports Medicine

## 2023-03-08 VITALS — BP 126/78 | Ht 63.0 in | Wt 140.0 lb

## 2023-03-08 DIAGNOSIS — M7502 Adhesive capsulitis of left shoulder: Secondary | ICD-10-CM | POA: Diagnosis not present

## 2023-03-08 MED ORDER — GABAPENTIN 300 MG PO CAPS: 300.0000 mg | ORAL_CAPSULE | Freq: Every day | ORAL | 0 refills | Status: DC

## 2023-03-08 NOTE — Progress Notes (Signed)
PCP: Shirlean Mylar, MD  Subjective:   HPI: Patient is a 47 y.o. female here for f/u of L shoulder pain. Last seen 02/01/23 when she received a subacromial injection and encouraged to use meloxicam and flexeril.  Since then, symptoms have not improved.  She feels the pain is slightly worse.  She has been using meloxicam and Flexeril regularly and has been trying some home exercises without relief.  Does endorse some radiating pain down the lateral aspect of her left upper arm.  Denies any recent traumatic injury to the area.  Past Medical History:  Diagnosis Date   Allergic rhinitis    Anxiety and depression    Asthma    Fibromyalgia    Grave's disease    Headache 07/02/2014    Current Outpatient Medications on File Prior to Visit  Medication Sig Dispense Refill   albuterol (PROVENTIL HFA;VENTOLIN HFA) 108 (90 Base) MCG/ACT inhaler Inhale 2 puffs into the lungs every 4 (four) hours as needed for wheezing or shortness of breath. 1 Inhaler 2   beclomethasone (QVAR) 80 MCG/ACT inhaler Inhale 2 puffs into the lungs 2 (two) times daily. (Patient taking differently: Inhale 1 puff into the lungs 2 (two) times daily.) 1 Inhaler 5   EPINEPHrine 0.3 mg/0.3 mL IJ SOAJ injection USE AS DIRECTED FOR SEVERE ALLERGIC REACTION. 2 Device 2   fluticasone (FLONASE) 50 MCG/ACT nasal spray Place 2 sprays into both nostrils at bedtime. 16 g 5   levocetirizine (XYZAL) 5 MG tablet Take 1 tablet (5 mg total) by mouth every evening. 30 tablet 5   levonorgestrel (MIRENA) 20 MCG/24HR IUD 1 each by Intrauterine route once.     levothyroxine (SYNTHROID) 112 MCG tablet Take 1 tablet (112 mcg total) by mouth daily before breakfast. 45 tablet 3   LORazepam (ATIVAN PO) Take by mouth.     Lumateperone Tosylate (CAPLYTA PO) Take by mouth.     montelukast (SINGULAIR) 10 MG tablet Take 1 tablet (10 mg total) by mouth at bedtime. 30 tablet 0   No current facility-administered medications on file prior to visit.    Past  Surgical History:  Procedure Laterality Date   APPENDECTOMY  2001   BREAST BIOPSY Right 11/17/2022   [place] RT BREAST BX W LOC DEV 1ST LESION IMG BX SPEC US GUIDE 11/17/2022 [facility]MO[facility]   CESAREAN SECTION  04/20/2011   Procedure: CESAREAN SECTION;  Surgeon: Jeani Hawking, MD;  Location: [facility];  Service: Gynecology;  Laterality: N/A;  Primary Cesarean Section with Delivery Baby Boy @ 0421   CHOLECYSTECTOMY  2006   HIP ARTHROSCOPY      Allergies  Allergen Reactions   Shrimp [Shellfish Allergy] Hives, Shortness Of Breath and Swelling   Watermelon Concentrate Hives, Shortness Of Breath and Swelling   Gadolinium Derivatives Nausea And Vomiting   Latex Rash    There were no vitals taken for this visit.      No data to display              No data to display              Objective:  Physical Exam:  Gen: NAD, comfortable in exam room  L shoulder: Inspection: No gross deformities, ecchymoses, swelling Palpation: Tender to palpation at [facility] joint and along upper scapula.  Pain with palpation of deltoid.  Otherwise nontender ROM: Active abduction and flexion limited to about 90 degrees due to pain. Passive external rotation limited to about 50 degrees (compared to 70 in  R shoulder). Strength: 5-/5 strength with abduction and external rotation. Otherwise 5/5 strength Special tests: Positive empty can, hawkins, neers. Negative yergason.    Assessment & Plan:  1.  Left shoulder pain 2/2 adhesive capsulitis and possible impingement: Low suspicion for rotator cuff tear given lack of injury and exam findings consistent with adhesive capsulitis.  Feel the patient would benefit most from physical therapy.  Physical therapist will work only on range of motion exercises at this time.  No strengthening.  Also prescribed gabapentin 300mg  daily for neuropathic pain. F/u in 4 weeks, consider MRI if no improvement.  Patient seen and evaluated with the resident.  I agree with the above  plan of care.  Patient has limited passive external rotation and abduction suggestive of adhesive capsulitis.  She will start formal physical therapy we will prescribe gabapentin 300 mg at night.  Follow-up again 4 weeks after starting physical therapy.  If no initial improvement is noted at follow-up, the consider imaging at that time.  This note was dictated using Dragon naturally speaking software and may contain errors in syntax, spelling, or content which have not been identified prior to signing this note.

## 2023-03-08 NOTE — Progress Notes (Signed)
       Patient Instructions  The night before surgery:  No food after midnight. ONLY clear liquids after midnight  The day of surgery (if you do NOT have diabetes):  Drink ONE (1) Pre-Surgery Clear Ensure as directed.   This drink was given to you during your hospital  pre-op appointment visit. The pre-op nurse will instruct you on the time to drink the  Pre-Surgery Ensure depending on your surgery time. Finish the drink at the designated time by the pre-op nurse.  Nothing else to drink after completing the  Pre-Surgery Clear Ensure.  The day of surgery (if you have diabetes): Drink ONE (1) Gatorade 2 (G2) as directed. This drink was given to you during your hospital  pre-op appointment visit.  The pre-op nurse will instruct you on the time to drink the   Gatorade 2 (G2) depending on your surgery time. Color of the Gatorade may vary. Red is not allowed. Nothing else to drink after completing the  Gatorade 2 (G2).         If you have questions, please contact your surgeon's office.   Patient given CHG presurgical wash and education provided. Patient verbalized understanding.

## 2023-03-11 ENCOUNTER — Encounter (HOSPITAL_BASED_OUTPATIENT_CLINIC_OR_DEPARTMENT_OTHER): Payer: Self-pay | Admitting: Surgery

## 2023-03-11 NOTE — Anesthesia Preprocedure Evaluation (Signed)
Anesthesia Evaluation  Patient identified by MRN, date of birth, ID band Patient awake    Reviewed: Allergy & Precautions, NPO status , Patient's Chart, lab work & pertinent test results  Airway Mallampati: III  TM Distance: >3 FB     Dental no notable dental hx.    Pulmonary asthma    Pulmonary exam normal breath sounds clear to auscultation       Cardiovascular negative cardio ROS Normal cardiovascular exam Rhythm:Regular Rate:Normal     Neuro/Psych  Headaches PSYCHIATRIC DISORDERS Anxiety Depression     Neuromuscular disease    GI/Hepatic negative GI ROS, Neg liver ROS,,,  Endo/Other  Hypothyroidism  Hx/o Grave's disease Chronic right breast abscess  Renal/GU negative Renal ROS  negative genitourinary   Musculoskeletal  (+)  Fibromyalgia -  Abdominal   Peds  Hematology negative hematology ROS (+)   Anesthesia Other Findings   Reproductive/Obstetrics                              Anesthesia Physical Anesthesia Plan  ASA: 2  Anesthesia Plan: General   Post-op Pain Management: Dilaudid IV, Tylenol PO (pre-op)* and Precedex   Induction: Intravenous  PONV Risk Score and Plan: 4 or greater and Treatment may vary due to age or medical condition, Midazolam and Ondansetron  Airway Management Planned: LMA  Additional Equipment: None  Intra-op Plan:   Post-operative Plan: Extubation in OR  Informed Consent: I have reviewed the patients History and Physical, chart, labs and discussed the procedure including the risks, benefits and alternatives for the proposed anesthesia with the patient or authorized representative who has indicated his/her understanding and acceptance.     Dental advisory given  Plan Discussed with: CRNA and Anesthesiologist  Anesthesia Plan Comments:         Anesthesia Quick Evaluation

## 2023-03-12 NOTE — H&P (Signed)
REFERRING PHYSICIAN: Lindell Noe, MD PROVIDER: Wayne Both, MD MRN: [id number] DOB: March 09, 1976 DATE OF ENCOUNTER: 02/24/2023 Subjective   Chief Complaint: New Consultation and Breast Problem  History of Present Illness: Lisa Day is a 47 y.o. female who is seen as an office consultation for evaluation of New Consultation and Breast Problem  This is a pleasant 47 year old female who presents with a chronic right breast abscess. She started having inflammation in the right breast medially at the nipple areolar complex in March of this year. She was having both spontaneous and expressed yellow nipple discharge. She underwent mammograms and ultrasounds showing an area of chronic inflammation and debris. She had a see of the area which showed chronic inflammation consistent with the abscess. Because of strong family history of breast cancer she also had an MRI of her breast bilaterally which again was consistent with a chronic infection around the nipple areolar complex. She has had no prior history of breast infections. She reports responding fairly well to doxycycline but tenderness, slight induration, and drainage from the nipple has persisted. She is otherwise healthy without complaints. She is not a smoker  Review of Systems: A complete review of systems was obtained from the patient. I have reviewed this information and discussed as appropriate with the patient. See HPI as well for other ROS.  ROS   Medical History: Past Medical History:  Diagnosis Date  Anxiety  Arthritis  Asthma, unspecified asthma severity, unspecified whether complicated, unspecified whether persistent ([facility])  Thyroid disease   There is no problem list on file for this patient.  Past Surgical History:  Procedure Laterality Date  APPENDECTOMY 2001  CHOLECYSTECTOMY 2006  CESAREAN SECTION 04/20/2011  BIOPSY BREAST W/ LOC DEVICE PLACEMENT AND STEREOTACTIC GUIDANCE Right 11/17/2022   ARTHROPLASTY HIP TOTAL    Allergies  Allergen Reactions  Shellfish Containing Products Hives, Shortness Of Breath, Swelling and Unknown  Watermelon Hives, Shortness Of Breath and Swelling  Gadolinium-Containing Contrast Media Nausea And Vomiting  Latex Rash   Current Outpatient Medications on File Prior to Visit  Medication Sig Dispense Refill  CAPLYTA 42 mg capsule Take 42 mg by mouth once daily  levonorgestreL (MIRENA) IUD placed 01/2022 Intrauterine  levothyroxine (SYNTHROID) 112 MCG tablet Take 112 mcg by mouth every morning before breakfast  levothyroxine (SYNTHROID) 125 MCG tablet Take 125 mcg by mouth once daily  LORazepam (ATIVAN) 2 MG tablet TAKE 1 TABLET BY MOUTH EVERY DAY FOR SEVERE ANXIETY (THIS IS A 25-MONTH SUPPLY)  meloxicam (MOBIC) 15 MG tablet 1 tablet Orally Once a day for 30 days As needed  montelukast (SINGULAIR) 10 mg tablet TAKE 1 TABLET BY MOUTH ONCE DAILY for 90  zolpidem (AMBIEN) 5 MG tablet Take 5 mg by mouth at bedtime   No current facility-administered medications on file prior to visit.   Family History  Problem Relation Age of Onset  High blood pressure (Hypertension) Mother    Social History   Tobacco Use  Smoking Status Never  Smokeless Tobacco Never    Social History   Socioeconomic History  Marital status: Single  Tobacco Use  Smoking status: Never  Smokeless tobacco: Never  Substance and Sexual Activity  Alcohol use: Yes  Alcohol/week: 2.0 standard drinks of alcohol  Types: 2 Standard drinks or equivalent per week  Drug use: Never   Objective:   Vitals:  02/24/23 1003  BP: 129/86  Pulse: 66  Temp: 36.8 C (98.3 F)  Weight: 62.7 kg (138 lb 3.2 oz)  Height:  160 cm (5\' 3" )  PainSc: 0-No pain   Body mass index is 24.48 kg/m.  Physical Exam   She appears well on exam  There are chronic skin changes with induration at the 3 o'clock position of the right breast along the nipple areolar complex. It is currently nontender  today and there is no nipple discharge  Labs, Imaging and Diagnostic Testing: I have reviewed her mammograms, ultrasound, MRI, and pathology results  Assessment and Plan:   Diagnoses and all orders for this visit:  Chronic abscess of breast  Other orders - doxycycline (MONODOX) 100 MG capsule; Take 1 capsule (100 mg total) by mouth 2 (two) times daily for 10 days   This is a chronic recurrent right breast abscess. This may secondary to a chronic sebaceous cyst given the dilated ducts and findings on imaging and biopsy. Nonetheless, surgical excision of the chronic abscess is recommended to prevent ongoing infections as well as to completely rule out malignancy. I discussed the diagnosis with the patient in detail. We discussed the surgical procedure. We discussed the risks which includes but is not limited to bleeding, infection, recurrence of infections, the need for a drain postoperatively, wound breakdown, postoperative recovery, etc. I will go ahead and send antibiotics to the pharmacy in case she has a flareup of the infection between now and the time of surgery. She understands and agrees with the plans.

## 2023-03-13 ENCOUNTER — Other Ambulatory Visit: Payer: Self-pay

## 2023-03-13 ENCOUNTER — Ambulatory Visit (HOSPITAL_BASED_OUTPATIENT_CLINIC_OR_DEPARTMENT_OTHER): Payer: 59 | Admitting: Anesthesiology

## 2023-03-13 ENCOUNTER — Encounter (HOSPITAL_BASED_OUTPATIENT_CLINIC_OR_DEPARTMENT_OTHER)
Admission: RE | Disposition: A | Discharge status: Home / Self Care | Payer: Self-pay | Source: Home / Self Care | Attending: Surgery

## 2023-03-13 ENCOUNTER — Ambulatory Visit (HOSPITAL_BASED_OUTPATIENT_CLINIC_OR_DEPARTMENT_OTHER)
Admission: RE | Admit: 2023-03-13 | Discharge: 2023-03-13 | Disposition: A | Discharge status: Home / Self Care | Payer: 59 | Attending: Surgery | Admitting: Surgery

## 2023-03-13 ENCOUNTER — Encounter (HOSPITAL_BASED_OUTPATIENT_CLINIC_OR_DEPARTMENT_OTHER): Payer: Self-pay | Admitting: Surgery

## 2023-03-13 DIAGNOSIS — Z01818 Encounter for other preprocedural examination: Secondary | ICD-10-CM

## 2023-03-13 DIAGNOSIS — N611 Abscess of the breast and nipple: Secondary | ICD-10-CM | POA: Diagnosis not present

## 2023-03-13 DIAGNOSIS — J45909 Unspecified asthma, uncomplicated: Secondary | ICD-10-CM | POA: Diagnosis not present

## 2023-03-13 DIAGNOSIS — Z803 Family history of malignant neoplasm of breast: Secondary | ICD-10-CM | POA: Insufficient documentation

## 2023-03-13 HISTORY — PX: EVACUATION BREAST HEMATOMA: SHX1537

## 2023-03-13 LAB — POCT PREGNANCY, URINE: Preg Test, Ur: NEGATIVE

## 2023-03-13 SURGERY — EVACUATION, HEMATOMA, BREAST
Anesthesia: General | Site: Breast | Laterality: Right

## 2023-03-13 MED ORDER — LIDOCAINE-EPINEPHRINE 1 %-1:100000 IJ SOLN
INTRAMUSCULAR | Status: AC
  Filled 2023-03-13: qty 1

## 2023-03-13 MED ORDER — CEFAZOLIN SODIUM-DEXTROSE 2-4 GM/100ML-% IV SOLN
2.0000 g | INTRAVENOUS | Status: AC
  Administered 2023-03-13: 2 g via INTRAVENOUS

## 2023-03-13 MED ORDER — DEXAMETHASONE SODIUM PHOSPHATE 10 MG/ML IJ SOLN
INTRAMUSCULAR | Status: AC
  Filled 2023-03-13: qty 1

## 2023-03-13 MED ORDER — ONDANSETRON HCL 4 MG/2ML IJ SOLN: 4.0000 mg | Freq: Once | INTRAMUSCULAR | Status: DC | PRN

## 2023-03-13 MED ORDER — MIDAZOLAM HCL 5 MG/5ML IJ SOLN
INTRAMUSCULAR | Status: DC | PRN
  Administered 2023-03-13: 2 mg via INTRAVENOUS

## 2023-03-13 MED ORDER — 0.9 % SODIUM CHLORIDE (POUR BTL) OPTIME
TOPICAL | Status: DC | PRN
  Administered 2023-03-13: 10 mL

## 2023-03-13 MED ORDER — ACETAMINOPHEN 500 MG PO TABS
ORAL_TABLET | ORAL | Status: AC
  Filled 2023-03-13: qty 2

## 2023-03-13 MED ORDER — CEFAZOLIN SODIUM-DEXTROSE 2-4 GM/100ML-% IV SOLN
INTRAVENOUS | Status: AC
  Filled 2023-03-13: qty 100

## 2023-03-13 MED ORDER — BUPIVACAINE-EPINEPHRINE 0.5% -1:200000 IJ SOLN
INTRAMUSCULAR | Status: DC | PRN
  Administered 2023-03-13: 10 mL

## 2023-03-13 MED ORDER — LACTATED RINGERS IV SOLN: INTRAVENOUS | Status: DC

## 2023-03-13 MED ORDER — PROPOFOL 500 MG/50ML IV EMUL
INTRAVENOUS | Status: AC
  Filled 2023-03-13: qty 50

## 2023-03-13 MED ORDER — FENTANYL CITRATE (PF) 100 MCG/2ML IJ SOLN: 25.0000 ug | INTRAMUSCULAR | Status: DC | PRN

## 2023-03-13 MED ORDER — ONDANSETRON HCL 4 MG/2ML IJ SOLN
INTRAMUSCULAR | Status: AC
  Filled 2023-03-13: qty 2

## 2023-03-13 MED ORDER — OXYMETAZOLINE HCL 0.05 % NA SOLN
NASAL | Status: AC
  Filled 2023-03-13: qty 30

## 2023-03-13 MED ORDER — BUPIVACAINE-EPINEPHRINE (PF) 0.5% -1:200000 IJ SOLN
INTRAMUSCULAR | Status: AC
  Filled 2023-03-13: qty 60

## 2023-03-13 MED ORDER — ACETAMINOPHEN 500 MG PO TABS
1000.0000 mg | ORAL_TABLET | ORAL | Status: AC
  Administered 2023-03-13: 1000 mg via ORAL

## 2023-03-13 MED ORDER — CHLORHEXIDINE GLUCONATE CLOTH 2 % EX PADS: 6.0000 | MEDICATED_PAD | Freq: Once | CUTANEOUS | Status: DC

## 2023-03-13 MED ORDER — MIDAZOLAM HCL 2 MG/2ML IJ SOLN
INTRAMUSCULAR | Status: AC
  Filled 2023-03-13: qty 2

## 2023-03-13 MED ORDER — FENTANYL CITRATE (PF) 100 MCG/2ML IJ SOLN
INTRAMUSCULAR | Status: DC | PRN
  Administered 2023-03-13 (×2): 25 ug via INTRAVENOUS
  Administered 2023-03-13: 50 ug via INTRAVENOUS

## 2023-03-13 MED ORDER — OXYCODONE HCL 5 MG PO TABS
ORAL_TABLET | ORAL | Status: AC
  Filled 2023-03-13: qty 1

## 2023-03-13 MED ORDER — PROPOFOL 10 MG/ML IV BOLUS
INTRAVENOUS | Status: DC | PRN
Start: 2023-03-13 — End: 2023-03-13
  Administered 2023-03-13: 50 mg via INTRAVENOUS
  Administered 2023-03-13: 150 mg via INTRAVENOUS

## 2023-03-13 MED ORDER — BUPIVACAINE HCL (PF) 0.25 % IJ SOLN
INTRAMUSCULAR | Status: AC
  Filled 2023-03-13: qty 30

## 2023-03-13 MED ORDER — OXYCODONE HCL 5 MG PO TABS
5.0000 mg | ORAL_TABLET | Freq: Once | ORAL | Status: AC | PRN
  Administered 2023-03-13: 5 mg via ORAL

## 2023-03-13 MED ORDER — LIDOCAINE 2% (20 MG/ML) 5 ML SYRINGE
INTRAMUSCULAR | Status: AC
  Filled 2023-03-13: qty 5

## 2023-03-13 MED ORDER — PHENYLEPHRINE HCL (PRESSORS) 10 MG/ML IV SOLN
INTRAVENOUS | Status: DC | PRN
  Administered 2023-03-13: 80 ug via INTRAVENOUS

## 2023-03-13 MED ORDER — ENSURE PRE-SURGERY PO LIQD: 296.0000 mL | Freq: Once | ORAL | Status: DC

## 2023-03-13 MED ORDER — OXYCODONE HCL 5 MG/5ML PO SOLN: 5.0000 mg | Freq: Once | ORAL | Status: AC | PRN

## 2023-03-13 MED ORDER — OXYCODONE HCL 5 MG PO TABS: 5.0000 mg | ORAL_TABLET | Freq: Four times a day (QID) | ORAL | 0 refills | Status: DC | PRN

## 2023-03-13 MED ORDER — ONDANSETRON HCL 4 MG/2ML IJ SOLN
INTRAMUSCULAR | Status: DC | PRN
  Administered 2023-03-13: 4 mg via INTRAVENOUS

## 2023-03-13 MED ORDER — DEXAMETHASONE SODIUM PHOSPHATE 4 MG/ML IJ SOLN
INTRAMUSCULAR | Status: DC | PRN
  Administered 2023-03-13: 4 mg via INTRAVENOUS

## 2023-03-13 MED ORDER — FENTANYL CITRATE (PF) 100 MCG/2ML IJ SOLN
INTRAMUSCULAR | Status: AC
  Filled 2023-03-13: qty 2

## 2023-03-13 MED ORDER — LIDOCAINE 2% (20 MG/ML) 5 ML SYRINGE
INTRAMUSCULAR | Status: DC | PRN
  Administered 2023-03-13: 40 mg via INTRAVENOUS

## 2023-03-13 SURGICAL SUPPLY — 42 items
ADH SKN CLS APL DERMABOND .7 (GAUZE/BANDAGES/DRESSINGS) ×1
APL PRP STRL LF DISP 70% ISPRP (MISCELLANEOUS) ×1
BLADE SURG 15 STRL LF DISP TIS (BLADE) ×2 IMPLANT
BLADE SURG 15 STRL SS (BLADE) ×1
CANISTER SUCT 1200ML W/VALVE (MISCELLANEOUS) IMPLANT
CHLORAPREP W/TINT 26 (MISCELLANEOUS) ×2 IMPLANT
CLIP TI WIDE RED SMALL 6 (CLIP) IMPLANT
COVER BACK TABLE 60X90IN (DRAPES) ×2 IMPLANT
COVER MAYO STAND STRL (DRAPES) ×2 IMPLANT
DERMABOND ADVANCED .7 DNX12 (GAUZE/BANDAGES/DRESSINGS) ×2 IMPLANT
DRAPE LAPAROTOMY 100X72 PEDS (DRAPES) ×2 IMPLANT
DRAPE UTILITY XL STRL (DRAPES) ×2 IMPLANT
ELECT REM PT RETURN 9FT ADLT (ELECTROSURGICAL) ×1
ELECTRODE REM PT RTRN 9FT ADLT (ELECTROSURGICAL) ×2 IMPLANT
GAUZE SPONGE 4X4 12PLY STRL LF (GAUZE/BANDAGES/DRESSINGS) ×2 IMPLANT
GLOVE BIOGEL PI IND STRL 7.0 (GLOVE) IMPLANT
GLOVE SURG SIGNA 7.5 PF LTX (GLOVE) ×2 IMPLANT
GLOVE SURG SS PI 7.0 STRL IVOR (GLOVE) IMPLANT
GLOVE SURG SYN 7.5 E (GLOVE) ×1
GLOVE SURG SYN 7.5 PF PI (GLOVE) IMPLANT
GOWN STRL REUS W/ TWL LRG LVL3 (GOWN DISPOSABLE) ×2 IMPLANT
GOWN STRL REUS W/ TWL XL LVL3 (GOWN DISPOSABLE) ×2 IMPLANT
GOWN STRL REUS W/TWL LRG LVL3 (GOWN DISPOSABLE) ×1
GOWN STRL REUS W/TWL XL LVL3 (GOWN DISPOSABLE) ×1
KIT MARKER MARGIN INK (KITS) ×2 IMPLANT
NDL HYPO 25X1 1.5 SAFETY (NEEDLE) ×2 IMPLANT
NEEDLE HYPO 25X1 1.5 SAFETY (NEEDLE) ×1
NS IRRIG 1000ML POUR BTL (IV SOLUTION) ×2 IMPLANT
PACK BASIN DAY SURGERY FS (CUSTOM PROCEDURE TRAY) ×2 IMPLANT
PENCIL SMOKE EVACUATOR (MISCELLANEOUS) ×2 IMPLANT
SLEEVE SCD COMPRESS KNEE MED (STOCKING) IMPLANT
SPIKE FLUID TRANSFER (MISCELLANEOUS) IMPLANT
SPONGE T-LAP 4X18 ~~LOC~~+RFID (SPONGE) ×2 IMPLANT
SUT MNCRL AB 4-0 PS2 18 (SUTURE) ×2 IMPLANT
SUT SILK 2 0 SH (SUTURE) ×2 IMPLANT
SUT VIC AB 3-0 SH 27 (SUTURE) ×1
SUT VIC AB 3-0 SH 27X BRD (SUTURE) ×2 IMPLANT
SYR CONTROL 10ML LL (SYRINGE) ×2 IMPLANT
TOWEL GREEN STERILE FF (TOWEL DISPOSABLE) ×2 IMPLANT
TRAY FAXITRON CT DISP (TRAY / TRAY PROCEDURE) IMPLANT
TUBE CONNECTING 20X1/4 (TUBING) IMPLANT
YANKAUER SUCT BULB TIP NO VENT (SUCTIONS) IMPLANT

## 2023-03-13 NOTE — Op Note (Signed)
EXCISION CHRONIC RIGHT BREAST ABSCESS  Procedure Note  Lisa Day 03/13/2023   Pre-op Diagnosis: CHRONIC RIGHT BREAST ABSCESS     Post-op Diagnosis: same  Procedure(s): EXCISION CHRONIC RIGHT BREAST ABSCESS  Surgeon(s): Abigail Miyamoto, MD  Anesthesia: General  Staff:  Circulator: Lenn Cal, RN Scrub Person: Randalyn Rhea, RN Circulator Assistant: Raliegh Scarlet, RN  Estimated Blood Loss: Minimal               Specimens: sent to path  Indications: This is a 47 year old female with a chronic right breast abscess.  She had a biopsy of the area in March that was consistent with chronic inflammation.  She had mammograms, ultrasound, and MRI given her family history of breast cancer.  The area improved but seem consistent with a chronically infected sebaceous cyst.  The decision was made to proceed to the operating room for excision of this area in the right breast  Findings: The area appeared consistent with a chronically inflamed sebaceous cyst.  There was no purulence.  The previous biopsy clip was removed as well as all inflamed, chronic cyst appearing tissue.  This did extend to the nipple  Procedure: The patient was brought to the operating room and identified as correct patient.  She was placed upon the operating table and general anesthesia was induced.  Her right breast was prepped and draped in usual sterile fashion.  I anesthetized the medial edge of the areola with Marcaine and then made a circumareolar incision with a scalpel.  I then dissected down superficially into the surrounding breast tissue.  The patient had tissue consistent with a chronic inflamed sebaceous cyst.  I excised the cystic area and granulation tissue circumferentially and down to the deeper tissue to remove the area.  The area extended all the way to the nipple inside to dissect underneath the nipple and take some nipple skin as well.  The remaining breast tissue appears healthy.   I sent the specimen to pathology for evaluation.  The initial biopsy clip did come out of the specimen and was visible.  We irrigated the wound with saline.  I injected further Marcaine into the wound.  We achieved hemostasis with the cautery.  I then closed the subcutaneous tissue with interrupted 3-0 Vicryl sutures and closed the skin with interrupted 4-0 Monocryl sutures.  Dermabond was then applied.  The patient tolerated the procedure well.  All counts were correct at the end of the procedure.  She was then extubated in the operating room and taken in stable condition to the recovery room.          Abigail Miyamoto   Date: 03/13/2023  Time: 8:07 AM

## 2023-03-13 NOTE — Anesthesia Procedure Notes (Signed)
Procedure Name: LMA Insertion Date/Time: 03/13/2023 7:39 AM  Performed by: Burna Cash, CRNAPre-anesthesia Checklist: Patient identified, Emergency Drugs available, Suction available and Patient being monitored Patient Re-evaluated:Patient Re-evaluated prior to induction Oxygen Delivery Method: Circle system utilized Preoxygenation: Pre-oxygenation with 100% oxygen Induction Type: IV induction Ventilation: Mask ventilation without difficulty LMA: LMA inserted LMA Size: 4.0 Number of attempts: 1 Airway Equipment and Method: Bite block Placement Confirmation: positive ETCO2 Tube secured with: Tape Dental Injury: Teeth and Oropharynx as per pre-operative assessment

## 2023-03-13 NOTE — Anesthesia Postprocedure Evaluation (Signed)
Anesthesia Post Note  Patient: Lisa Day  Procedure(s) Performed: EXCISION CHRONIC RIGHT BREAST ABSCESS (Right: Breast)     Patient location during evaluation: PACU Anesthesia Type: General Level of consciousness: awake and alert and oriented Pain management: pain level controlled Vital Signs Assessment: post-procedure vital signs reviewed and stable Respiratory status: spontaneous breathing, nonlabored ventilation and respiratory function stable Cardiovascular status: blood pressure returned to baseline and stable Postop Assessment: no apparent nausea or vomiting Anesthetic complications: no   No notable events documented.  Last Vitals:  Vitals:   03/13/23 0815 03/13/23 0830  BP: (!) 93/58 106/73  Pulse: 61 (!) 54  Resp: 16 17  Temp:    SpO2: 100% 100%    Last Pain:  Vitals:   03/13/23 0830  TempSrc:   PainSc: 1                  Areli Jowett A.

## 2023-03-13 NOTE — Discharge Instructions (Addendum)
Next dose of Tylenol due after 12:35 today if needed  [employer] Office Phone Number [phone]  BREAST BIOPSY/ PARTIAL MASTECTOMY: POST OP INSTRUCTIONS  Always review your discharge instruction sheet given to you by the facility where your surgery was performed.  IF YOU HAVE DISABILITY OR FAMILY LEAVE FORMS, YOU MUST BRING THEM TO THE OFFICE FOR PROCESSING.  DO NOT GIVE THEM TO YOUR DOCTOR.  A prescription for pain medication may be given to you upon discharge.  Take your pain medication as prescribed, if needed.  If narcotic pain medicine is not needed, then you may take acetaminophen (Tylenol) or ibuprofen (Advil) as needed. Take your usually prescribed medications unless otherwise directed If you need a refill on your pain medication, please contact your pharmacy.  They will contact our office to request authorization.  Prescriptions will not be filled after 5pm or on week-ends. You should eat very light the first 24 hours after surgery, such as soup, crackers, pudding, etc.  Resume your normal diet the day after surgery. Most patients will experience some swelling and bruising in the breast.  Ice packs and a good support bra will help.  Swelling and bruising can take several days to resolve.  It is common to experience some constipation if taking pain medication after surgery.  Increasing fluid intake and taking a stool softener will usually help or prevent this problem from occurring.  A mild laxative (Milk of Magnesia or Miralax) should be taken according to package directions if there are no bowel movements after 48 hours. Unless discharge instructions indicate otherwise, you may remove your bandages 24-48 hours after surgery, and you may shower at that time.  You may have steri-strips (small skin tapes) in place directly over the incision.  These strips should be left on the skin for 7-10 days.  If your surgeon used skin glue on the incision, you may shower in 24 hours.   The glue will flake off over the next 2-3 weeks.  Any sutures or staples will be removed at the office during your follow-up visit. ACTIVITIES:  You may resume regular daily activities (gradually increasing) beginning the next day.  Wearing a good support bra or sports bra minimizes pain and swelling.  You may have sexual intercourse when it is comfortable. You may drive when you no longer are taking prescription pain medication, you can comfortably wear a seatbelt, and you can safely maneuver your car and apply brakes. RETURN TO WORK:  ______________________________________________________________________________________ Bonita Quin should see your doctor in the office for a follow-up appointment approximately two weeks after your surgery.  Your doctor's nurse will typically make your follow-up appointment when she calls you with your pathology report.  Expect your pathology report 2-3 business days after your surgery.  You may call to check if you do not hear from [place] after three days. OTHER INSTRUCTIONS: YOU MAY SHOWER STARTING TOMORROW TRY TO LEAVE THE GLUE ON AT LEAST ONE WEEK ICE PACK, TYLENOL, AND IBUPROFEN ALSO FOR PAIN NO VIGOROUS ACTIVITY FOR ONE WEEK EXPECT SOME DRAINAGE INCLUDING THROUGH THE NIPPLE _______________________________________________________________________________________________ _____________________________________________________________________________________________________________________________________ _____________________________________________________________________________________________________________________________________ _____________________________________________________________________________________________________________________________________  WHEN TO CALL YOUR DOCTOR: Fever over 101.0 Nausea and/or vomiting. Extreme swelling or bruising. Continued bleeding from incision. Increased pain, redness, or drainage from the incision.  The clinic staff is  available to answer your questions during regular business hours.  Please don't hesitate to call and ask to speak to one of the nurses for clinical concerns.  If you have a medical emergency, go  to the nearest emergency room or call 911.  A surgeon from [facility] is always on call at the hospital.  For further questions, please visit [email or web address]    Post Anesthesia Home Care Instructions  Activity: Get plenty of rest for the remainder of the day. A responsible individual must stay with you for 24 hours following the procedure.  For the next 24 hours, DO NOT: -Drive a car -Advertising copywriter -Drink alcoholic beverages -Take any medication unless instructed by your physician -Make any legal decisions or sign important papers.  Meals: Start with liquid foods such as gelatin or soup. Progress to regular foods as tolerated. Avoid greasy, spicy, heavy foods. If nausea and/or vomiting occur, drink only clear liquids until the nausea and/or vomiting subsides. Call your physician if vomiting continues.  Special Instructions/Symptoms: Your throat may feel dry or sore from the anesthesia or the breathing tube placed in your throat during surgery. If this causes discomfort, gargle with warm salt water. The discomfort should disappear within 24 hours.  If you had a scopolamine patch placed behind your ear for the management of post- operative nausea and/or vomiting:  1. The medication in the patch is effective for 72 hours, after which it should be removed.  Wrap patch in a tissue and discard in the trash. Wash hands thoroughly with soap and water. 2. You may remove the patch earlier than 72 hours if you experience unpleasant side effects which may include dry mouth, dizziness or visual disturbances. 3. Avoid touching the patch. Wash your hands with soap and water after contact with the patch.

## 2023-03-13 NOTE — Interval H&P Note (Signed)
History and Physical Interval Note:no change in H and P  03/13/2023 7:05 AM  Lisa Day  has presented today for surgery, with the diagnosis of CHRONIC RIGHT BREAST ABSCESS.  The various methods of treatment have been discussed with the patient and family. After consideration of risks, benefits and other options for treatment, the patient has consented to  Procedure(s) with comments: EXCISION CHRONIC RIGHT BREAST ABSCESS (Right) - LMA as a surgical intervention.  The patient's history has been reviewed, patient examined, no change in status, stable for surgery.  I have reviewed the patient's chart and labs.  Questions were answered to the patient's satisfaction.     Lisa Day

## 2023-03-13 NOTE — Transfer of Care (Signed)
Immediate Anesthesia Transfer of Care Note  Patient: Farrah Janace Hoard  Procedure(s) Performed: EXCISION CHRONIC RIGHT BREAST ABSCESS (Right: Breast)  Patient Location: PACU  Anesthesia Type:General  Level of Consciousness: sedated  Airway & Oxygen Therapy: Patient Spontanous Breathing and Patient connected to face mask oxygen  Post-op Assessment: Report given to RN and Post -op Vital signs reviewed and stable  Post vital signs: Reviewed and stable  Last Vitals:  Vitals Value Taken Time  BP 93/54 03/13/23 0810  Temp    Pulse 64 03/13/23 0812  Resp 17 03/13/23 0812  SpO2 100 % 03/13/23 0812  Vitals shown include unfiled device data.  Last Pain:  Vitals:   03/13/23 0631  TempSrc: Temporal  PainSc: 0-No pain      Patients Stated Pain Goal: 3 (03/13/23 0631)  Complications: No notable events documented.

## 2023-03-14 ENCOUNTER — Encounter (HOSPITAL_BASED_OUTPATIENT_CLINIC_OR_DEPARTMENT_OTHER): Payer: Self-pay | Admitting: Surgery

## 2023-03-14 LAB — SURGICAL PATHOLOGY

## 2023-03-17 ENCOUNTER — Encounter: Payer: Self-pay | Admitting: Internal Medicine

## 2023-03-17 ENCOUNTER — Ambulatory Visit (AMBULATORY_SURGERY_CENTER): Payer: 59

## 2023-03-17 VITALS — Ht 63.0 in | Wt 145.0 lb

## 2023-03-17 DIAGNOSIS — Z1211 Encounter for screening for malignant neoplasm of colon: Secondary | ICD-10-CM

## 2023-03-17 MED ORDER — NA SULFATE-K SULFATE-MG SULF 17.5-3.13-1.6 GM/177ML PO SOLN
1.0000 | Freq: Once | ORAL | 0 refills | Status: AC
Start: 2023-03-17 — End: 2023-03-17

## 2023-03-17 NOTE — Progress Notes (Signed)

## 2023-03-20 ENCOUNTER — Ambulatory Visit: Payer: 59 | Attending: Ophthalmology | Admitting: Audiologist

## 2023-03-20 DIAGNOSIS — H9193 Unspecified hearing loss, bilateral: Secondary | ICD-10-CM | POA: Diagnosis present

## 2023-03-20 NOTE — Procedures (Unsigned)
  Outpatient Audiology and [facility] [address] [place], [place]  [address] [phone]  AUDIOLOGICAL  EVALUATION  NAME: Lisa Day     DOB:   1975/11/06      MRN: [id number]                                                                                     DATE: 03/20/2023     REFERENT: Camie Patience, FNP STATUS: Outpatient DIAGNOSIS: Normal Hearing  History: Lisa Day was seen for an audiological evaluation due to concerns for her hearing after receiving infusions. She was told to get a hearing test before receiving infusions. She was referred by Dr. Caroleen Hamman. Lisa Day feels she hears well. She has no tinnitus, pain or pressure in either ear.    Evaluation:  Otoscopy showed a clear view of the tympanic membranes, bilaterally Tympanometry results were consistent with normal middle ear function, bilaterally   Audiometric testing was completed using Conventional Audiometry techniques with insert earphones and high frequency headphones. Test results are consistent with normal hearing 250-12.5kHz bilaterally. Speech Recognition Thresholds were obtained at 10dB HL in the right ear and at 5 dB HL in the left ear. Word Recognition Testing was 100% in each ear.   Results:  The test results were reviewed with Lisa Day. She has excellent hearing in each ear. She has little risk of hearing loss that will impact her communication due to normal hearing up to 12.5kHz.    Recommendations: 1.   Recommend testing after receiving any infusion that may be ototoxic.   23 minutes spent testing and counseling on results.   If you have any questions please feel free to contact me at [phone].  Ammie Ferrier Audiologist, Au.D., CCC-A 03/20/2023  3:13 PM  Cc: Camie Patience, FNP

## 2023-03-25 ENCOUNTER — Ambulatory Visit: Payer: 59 | Attending: Sports Medicine

## 2023-03-25 DIAGNOSIS — G8929 Other chronic pain: Secondary | ICD-10-CM | POA: Diagnosis present

## 2023-03-25 DIAGNOSIS — M25612 Stiffness of left shoulder, not elsewhere classified: Secondary | ICD-10-CM | POA: Insufficient documentation

## 2023-03-25 DIAGNOSIS — M7502 Adhesive capsulitis of left shoulder: Secondary | ICD-10-CM | POA: Diagnosis not present

## 2023-03-25 DIAGNOSIS — M25512 Pain in left shoulder: Secondary | ICD-10-CM | POA: Diagnosis present

## 2023-03-25 NOTE — Therapy (Addendum)
OUTPATIENT PHYSICAL THERAPY SHOULDER EVALUATION   Patient Name: Lisa Day MRN: [id number] DOB:08-04-1976, 47 y.o., female Today's Date: 03/25/2023  END OF SESSION:  PT End of Session - 03/25/23 1005     Visit Number 1    Number of Visits 17    Date for PT Re-Evaluation 05/20/23    Authorization Type U[employer]    PT Start Time 0815    PT Stop Time 0855    PT Time Calculation (min) 40 min    Activity Tolerance Patient tolerated treatment well;Patient limited by pain    Behavior During Therapy [facility] for tasks assessed/performed             Past Medical History:  Diagnosis Date   Allergic rhinitis    Allergy    Anxiety and depression    Asthma    Fibromyalgia    Grave's disease    Headache 07/02/2014   Past Surgical History:  Procedure Laterality Date   APPENDECTOMY  2001   BREAST BIOPSY Right 11/17/2022   [place] RT BREAST BX W LOC DEV 1ST LESION IMG BX SPEC US GUIDE 11/17/2022 [facility]   CESAREAN SECTION  04/20/2011   Procedure: CESAREAN SECTION;  Surgeon: Jeani Hawking, MD;  Location: [facility];  Service: Gynecology;  Laterality: N/A;  Primary Cesarean Section with Delivery Baby Boy @ 0421   CHOLECYSTECTOMY  2006   EVACUATION BREAST HEMATOMA Right 03/13/2023   Procedure: EXCISION CHRONIC RIGHT BREAST ABSCESS;  Surgeon: Abigail Miyamoto, MD;  Location: [facility];  Service: General;  Laterality: Right;  LMA   HIP ARTHROSCOPY     Patient Active Problem List   Diagnosis Date Noted   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   Allergy with anaphylaxis due to food 05/16/2016   Allergic rhinitis due to pollen 05/16/2016   Moderate persistent asthma 05/16/2016   Right hip pain 05/01/2015   Headache 07/02/2014   Routine health maintenance 01/02/2013   S/P cesarean section 04/20/2011   Current pregnancy with history of pre-term labor 03/17/2011    PCP: Camie Patience, FNP  REFERRING PROVIDER: Camie Patience, FNP  REFERRING DIAG: Ralene Cork,  DO  THERAPY DIAG:  Stiffness of left shoulder, not elsewhere classified  Chronic left shoulder pain  Rationale for Evaluation and Treatment: Rehabilitation  ONSET DATE: 2-3 months   SUBJECTIVE:                                                                                                                                                                                      SUBJECTIVE STATEMENT: Patient reports to PT with Lt shoulder pain that is primarily at her joint, with  some radiating pain towards neck and distally towards elbow. She states that she didn't notice any improvement following subacromial injection. She is confirms that she is taking a muscle relaxer. She does not recall MOI and seems to have developed gradually.     Hand dominance: Right  PERTINENT HISTORY: PMHx includes anxiety/depression, asthma, fibromyalgia, Dockter' disease  PAIN:  Are you having pain? Yes: NPRS scale: 5-10/10 Pain location: Lt shoulder, with some radiating symptoms  Pain description: sharp, pulling, occasional paresthesia Aggravating factors: stretching/reaching, overhead reaching, bathing/dressing  Relieving factors: none identified  PRECAUTIONS: None   WEIGHT BEARING RESTRICTIONS: No  FALLS:  Has patient fallen in last 6 months? No  LIVING ENVIRONMENT: Lives with: lives with their family Lives in: House/apartment Stairs: Yes: Internal: 12 steps; none and External: 5 steps; none Has following equipment at home: None  OCCUPATION: Sedentary, desk job   PLOF: Independent  PATIENT GOALS: To have decreased pain and improved mobility; ability to return to bathing and dressing without pain.  Ability to reach to shelf without increased symptoms  NEXT MD VISIT:   OBJECTIVE:   DIAGNOSTIC FINDINGS:  None performed/available at time of evaluation.  PATIENT SURVEYS:  FOTO 48 current, 65 predicted  COGNITION: Overall cognitive status: Within functional limits for tasks  assessed     SENSATION: Not tested  POSTURE: Slightly rounded shoulders, Minimal forward while seated  UPPER EXTREMITY ROM:   Active ROM Left eval  Shoulder flexion 85  Shoulder extension   Shoulder abduction 48  Shoulder adduction   Shoulder internal rotation WNL  Shoulder external rotation 0  Elbow flexion   Elbow extension   Wrist flexion   Wrist extension   Wrist ulnar deviation   Wrist radial deviation   Wrist pronation   Wrist supination   (Blank rows = not tested)  UPPER EXTREMITY MMT: Not formally assessed at eval due to range of motion deficits  MMT Right eval Left eval  Shoulder flexion    Shoulder extension    Shoulder abduction    Shoulder adduction    Shoulder internal rotation    Shoulder external rotation    Middle trapezius    Lower trapezius    Elbow flexion    Elbow extension    Wrist flexion    Wrist extension    Wrist ulnar deviation    Wrist radial deviation    Wrist pronation    Wrist supination    Grip strength (lbs)    (Blank rows = not tested)  SHOULDER SPECIAL TESTS: None performed at evaluation due to range of motion deficits and current mobility deficits  PALPATION:  Moderate to severe tenderness to palpation throughout the left shoulder and parascapular musculature, with most tenderness with palpation to supraspinatus/infraspinatus insertion.    TODAY'S TREATMENT:  [facility] Adult PT Treatment:                                                DATE: 03/25/2023  Manual Therapy: Passive range of motion in all directions as patient patient tolerated  Therapeutic Activity: Patient education and home exercise prescription as noted below     PATIENT EDUCATION: Education details: reviewed initial home exercise program; discussion of POC, prognosis and goals for skilled PT   Person educated:  Patient Education method: Explanation, Demonstration, and Handouts Education comprehension: verbalized understanding, returned demonstration, and needs further education  HOME EXERCISE PROGRAM: Access Code: [email or web address] URL: [email or web address] Date: 03/25/2023 Prepared by: Mauri Reading  Exercises - Seated Shoulder Pendulum Exercise  - 2 x daily - 7 x weekly - 3 reps - 30 sec hold - Horizontal Shoulder Pendulum with Table Support  - 2 x daily - 7 x weekly - 3 reps - 30 sec hold - Seated Shoulder Flexion Towel Slide at Table Top Full Range of Motion  - 2 x daily - 7 x weekly - 1-2 sets - 10 reps - 3 sec hold - Standing Single Arm Shoulder Flexion Towel Slide at Table Top  - 2 x daily - 7 x weekly - 1-2 sets - 10 reps - 3 sec hold  Patient Education - Frozen Shoulder  ASSESSMENT:  CLINICAL IMPRESSION: Patient is a 47 y.o. female who was seen today for physical therapy evaluation and treatment for Left shoulder pain with mobility deficits. She does demonstrate moderate-to-severe tenderness to palpation along L shoulder and periscapular musculature and has decreased shoulder AROM in multiple directions as consistent with referring diagnosis of adhesive capsulitis. She will require skilled PT to address relevant deficits and maximize outcomes.   OBJECTIVE IMPAIRMENTS: decreased activity tolerance, decreased mobility, decreased ROM, decreased strength, impaired UE functional use, postural dysfunction, and pain.   ACTIVITY LIMITATIONS: carrying, lifting, sleeping, bed mobility, bathing, dressing, reach over head, and hygiene/grooming  PARTICIPATION LIMITATIONS: meal prep, cleaning, interpersonal relationship, driving, shopping, and community activity  PERSONAL FACTORS: Time since onset of injury/illness/exacerbation and 1-2 comorbidities: PMHx includes anxiety/depression, asthma, fibromyalgia, Issa' disease  are also affecting patient's functional outcome.   REHAB POTENTIAL:  Good  CLINICAL DECISION MAKING: Stable/uncomplicated  EVALUATION COMPLEXITY: Low   GOALS: Goals reviewed with patient? Yes  SHORT TERM GOALS: Target date: 04/22/2023   Patient will be independent with initial home program for shoulder AAROM activities.  Baseline: provided at eval  Goal status: INITIAL  2.  Patient will demonstrate improved postural awareness for at least 15 minutes while seated without need for cueing from PT.   Baseline: see objective measures  Goal status: INITIAL   LONG TERM GOALS: Target date: 05/20/2023   Patient will report improved overall functional ability with FOTO score of 65 or greater.  Baseline: 48  Goal status: INITIAL  2.  Patient will demonstrate ability to perform overhead lifting of at least 5# using appropriate body mechanics and with no more than minimal pain in order to safely perform normal daily/occupational tasks.   Baseline: unable  Goal status: INITIAL  3.  Patient will demonstrate improved Lt shoulder AROM to at least 150 shoulder flexion/abduction.   Baseline:  Active ROM Left eval  Shoulder flexion 85  Shoulder extension   Shoulder abduction 48  Shoulder adduction   Shoulder internal rotation WNL  Shoulder external rotation 0   Goal status: INITIAL  4.  Patient will demonstrate improved Lt shoulder strength to at least 4/5 MMT in all directions.  Baseline: unable to formally assess  Goal status: INITIAL  5.  Patient will report ability to performing normal bathing/dressing activities without exacerbation of symptoms.  Baseline: unable Goal status: INITIAL    PLAN:  PT FREQUENCY: 2x/week  PT DURATION: 8 weeks  PLANNED INTERVENTIONS: Therapeutic exercises, Therapeutic activity, Neuromuscular re-education, Self Care, Joint mobilization, Dry Needling, Electrical stimulation, Cryotherapy, Moist heat, Taping, Vasopneumatic device, Ionotophoresis 4mg /ml Dexamethasone, Manual therapy, and Re-evaluation  PLAN FOR NEXT  SESSION: mobility only per MD referral; PROM/AAROM, manual therapy, TPDN as indicated, modalities as appropriate, ongoing pt education regarding condition    Mauri Reading, PT, DPT 03/25/2023, 10:08 AM

## 2023-03-28 ENCOUNTER — Ambulatory Visit: Payer: 59

## 2023-03-28 DIAGNOSIS — M25612 Stiffness of left shoulder, not elsewhere classified: Secondary | ICD-10-CM

## 2023-03-28 DIAGNOSIS — G8929 Other chronic pain: Secondary | ICD-10-CM

## 2023-03-28 NOTE — Therapy (Signed)
OUTPATIENT PHYSICAL THERAPY TREATMENT NOTE   Patient Name: Lisa Day MRN: [id number] DOB:13-Jan-1976, 47 y.o., female Today's Date: 03/28/2023  END OF SESSION:  PT End of Session - 03/28/23 1528     Visit Number 2    Number of Visits 17    Date for PT Re-Evaluation 05/20/23    Authorization Type UHC    PT Start Time 1530    PT Stop Time 1610    PT Time Calculation (min) 40 min    Activity Tolerance Patient tolerated treatment well;Patient limited by pain    Behavior During Therapy [facility] for tasks assessed/performed              Past Medical History:  Diagnosis Date   Allergic rhinitis    Allergy    Anxiety and depression    Asthma    Fibromyalgia    Grave's disease    Headache 07/02/2014   Past Surgical History:  Procedure Laterality Date   APPENDECTOMY  2001   BREAST BIOPSY Right 11/17/2022   [place] RT BREAST BX W LOC DEV 1ST LESION IMG BX SPEC US GUIDE 11/17/2022 [facility]   CESAREAN SECTION  04/20/2011   Procedure: CESAREAN SECTION;  Surgeon: Jeani Hawking, MD;  Location: [facility];  Service: Gynecology;  Laterality: N/A;  Primary Cesarean Section with Delivery Baby Boy @ 0421   CHOLECYSTECTOMY  2006   EVACUATION BREAST HEMATOMA Right 03/13/2023   Procedure: EXCISION CHRONIC RIGHT BREAST ABSCESS;  Surgeon: Abigail Miyamoto, MD;  Location: [facility];  Service: General;  Laterality: Right;  LMA   HIP ARTHROSCOPY     Patient Active Problem List   Diagnosis Date Noted   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   Allergy with anaphylaxis due to food 05/16/2016   Allergic rhinitis due to pollen 05/16/2016   Moderate persistent asthma 05/16/2016   Right hip pain 05/01/2015   Headache 07/02/2014   Routine health maintenance 01/02/2013   S/P cesarean section 04/20/2011   Current pregnancy with history of pre-term labor 03/17/2011    PCP: Camie Patience, FNP  REFERRING PROVIDER: Camie Patience, FNP  REFERRING DIAG: Ralene Cork,  DO  THERAPY DIAG:  Stiffness of left shoulder, not elsewhere classified  Chronic left shoulder pain  Rationale for Evaluation and Treatment: Rehabilitation  ONSET DATE: 2-3 months   SUBJECTIVE:                                                                                                                                                                                      SUBJECTIVE STATEMENT: Patient reports that her pain persists, she has a really hard time sleeping.  Hand dominance: Right  PERTINENT HISTORY: PMHx includes anxiety/depression, asthma, fibromyalgia, Hofacker' disease  PAIN:  Are you having pain? Yes: NPRS scale: 8/10 Pain location: Lt shoulder, with some radiating symptoms  Pain description: sharp, pulling, occasional paresthesia Aggravating factors: stretching/reaching, overhead reaching, bathing/dressing  Relieving factors: none identified  PRECAUTIONS: None   WEIGHT BEARING RESTRICTIONS: No  FALLS:  Has patient fallen in last 6 months? No  LIVING ENVIRONMENT: Lives with: lives with their family Lives in: House/apartment Stairs: Yes: Internal: 12 steps; none and External: 5 steps; none Has following equipment at home: None  OCCUPATION: Sedentary, desk job   PLOF: Independent  PATIENT GOALS: To have decreased pain and improved mobility; ability to return to bathing and dressing without pain.  Ability to reach to shelf without increased symptoms  NEXT MD VISIT:   OBJECTIVE:   DIAGNOSTIC FINDINGS:  None performed/available at time of evaluation.  PATIENT SURVEYS:  FOTO 48 current, 65 predicted  COGNITION: Overall cognitive status: Within functional limits for tasks assessed     SENSATION: Not tested  POSTURE: Slightly rounded shoulders, Minimal forward while seated  UPPER EXTREMITY ROM:   Active ROM Left eval  Shoulder flexion 85  Shoulder extension   Shoulder abduction 48  Shoulder adduction   Shoulder internal rotation  WNL  Shoulder external rotation 0  Elbow flexion   Elbow extension   Wrist flexion   Wrist extension   Wrist ulnar deviation   Wrist radial deviation   Wrist pronation   Wrist supination   (Blank rows = not tested)  UPPER EXTREMITY MMT: Not formally assessed at eval due to range of motion deficits  MMT Right eval Left eval  Shoulder flexion    Shoulder extension    Shoulder abduction    Shoulder adduction    Shoulder internal rotation    Shoulder external rotation    Middle trapezius    Lower trapezius    Elbow flexion    Elbow extension    Wrist flexion    Wrist extension    Wrist ulnar deviation    Wrist radial deviation    Wrist pronation    Wrist supination    Grip strength (lbs)    (Blank rows = not tested)  SHOULDER SPECIAL TESTS: None performed at evaluation due to range of motion deficits and current mobility deficits  PALPATION:  Moderate to severe tenderness to palpation throughout the left shoulder and parascapular musculature, with most tenderness with palpation to supraspinatus/infraspinatus insertion.    TODAY'S TREATMENT:        [facility] Adult PT Treatment:                                                DATE: 03/28/23 Therapeutic Exercise: Pulley flexion/scaption x2' each UE ranger flexion 33" x20 Lt Table slides flexion, ER, scaption x10 each 5" hold at end range Manual Therapy: PROM all motions as tolerated Lt AP/inf joint mobs grade I-III Lt  [facility] Adult PT Treatment:                                                DATE: 03/25/2023  Manual Therapy: Passive range of motion in all directions as patient patient tolerated  Therapeutic Activity: Patient education and home exercise prescription as noted below     PATIENT EDUCATION: Education details: reviewed initial home exercise program; discussion of POC, prognosis  and goals for skilled PT   Person educated: Patient Education method: Explanation, Demonstration, and Handouts Education comprehension: verbalized understanding, returned demonstration, and needs further education  HOME EXERCISE PROGRAM: Access Code: [email or web address] URL: [email or web address] Date: 03/25/2023 Prepared by: Mauri Reading  Exercises - Seated Shoulder Pendulum Exercise  - 2 x daily - 7 x weekly - 3 reps - 30 sec hold - Horizontal Shoulder Pendulum with Table Support  - 2 x daily - 7 x weekly - 3 reps - 30 sec hold - Seated Shoulder Flexion Towel Slide at Table Top Full Range of Motion  - 2 x daily - 7 x weekly - 1-2 sets - 10 reps - 3 sec hold - Standing Single Arm Shoulder Flexion Towel Slide at Table Top  - 2 x daily - 7 x weekly - 1-2 sets - 10 reps - 3 sec hold  Patient Education - Frozen Shoulder  ASSESSMENT:  CLINICAL IMPRESSION: Patient presents to PT reporting continued pain in her Lt shoulder and that she has been compliant with her HEP. Session today focused on AAROM and PROM/joint mobs for pain modulation and increasing ROM. She is limited by pain throughout session. Patient continues to benefit from skilled PT services and should be progressed as able to improve functional independence.    OBJECTIVE IMPAIRMENTS: decreased activity tolerance, decreased mobility, decreased ROM, decreased strength, impaired UE functional use, postural dysfunction, and pain.   ACTIVITY LIMITATIONS: carrying, lifting, sleeping, bed mobility, bathing, dressing, reach over head, and hygiene/grooming  PARTICIPATION LIMITATIONS: meal prep, cleaning, interpersonal relationship, driving, shopping, and community activity  PERSONAL FACTORS: Time since onset of injury/illness/exacerbation and 1-2 comorbidities: PMHx includes anxiety/depression, asthma, fibromyalgia, Welter' disease  are also affecting patient's functional outcome.   REHAB POTENTIAL: Good  CLINICAL DECISION  MAKING: Stable/uncomplicated  EVALUATION COMPLEXITY: Low   GOALS: Goals reviewed with patient? Yes  SHORT TERM GOALS: Target date: 04/22/2023   Patient will be independent with initial home program for shoulder AAROM activities.  Baseline: provided at eval  Goal status: INITIAL  2.  Patient will demonstrate improved postural awareness for at least 15 minutes while seated without need for cueing from PT.   Baseline: see objective measures  Goal status: INITIAL   LONG TERM GOALS: Target date: 05/20/2023   Patient will report improved overall functional ability with FOTO score of 65 or greater.  Baseline: 48  Goal status: INITIAL  2.  Patient will demonstrate ability to perform overhead lifting of at least 5# using appropriate body mechanics and with no more than minimal pain in order to safely perform normal daily/occupational tasks.   Baseline: unable  Goal status: INITIAL  3.  Patient will demonstrate improved Lt shoulder AROM to at least 150 shoulder flexion/abduction.   Baseline:  Active ROM Left eval  Shoulder flexion 85  Shoulder extension   Shoulder abduction 48  Shoulder adduction   Shoulder internal rotation WNL  Shoulder external rotation 0  Goal status: INITIAL  4.  Patient will demonstrate improved Lt shoulder strength to at least 4/5 MMT in all directions.  Baseline: unable to formally assess  Goal status: INITIAL  5.  Patient will report ability to performing normal bathing/dressing activities without exacerbation of symptoms.  Baseline: unable Goal status: INITIAL    PLAN:  PT FREQUENCY: 2x/week  PT DURATION: 8 weeks  PLANNED INTERVENTIONS: Therapeutic exercises, Therapeutic activity, Neuromuscular re-education, Self Care, Joint mobilization, Dry Needling, Electrical stimulation, Cryotherapy, Moist heat, Taping, Vasopneumatic device, Ionotophoresis 4mg /ml Dexamethasone, Manual therapy, and Re-evaluation  PLAN FOR NEXT SESSION: mobility only  per MD referral; PROM/AAROM, manual therapy, TPDN as indicated, modalities as appropriate, ongoing pt education regarding condition    Berta Minor, PTA 03/28/2023, 4:18 PM

## 2023-03-30 ENCOUNTER — Ambulatory Visit: Payer: 59 | Admitting: Physical Therapy

## 2023-04-05 ENCOUNTER — Encounter: Payer: Self-pay | Admitting: Physical Therapy

## 2023-04-05 ENCOUNTER — Ambulatory Visit: Payer: 59 | Admitting: Physical Therapy

## 2023-04-05 DIAGNOSIS — G8929 Other chronic pain: Secondary | ICD-10-CM

## 2023-04-05 DIAGNOSIS — M25612 Stiffness of left shoulder, not elsewhere classified: Secondary | ICD-10-CM | POA: Diagnosis not present

## 2023-04-05 NOTE — Therapy (Signed)
OUTPATIENT PHYSICAL THERAPY TREATMENT NOTE   Patient Name: Lisa Day MRN: [id number] DOB:[birth year], 47 y.o., female Today's Date: 04/05/2023  END OF SESSION:  PT End of Session - 04/05/23 0829     Visit Number 3    Number of Visits 17    Date for PT Re-Evaluation 05/20/23    Authorization Type U[employer]    PT Start Time 0830    PT Stop Time 0912    PT Time Calculation (min) 42 min    Activity Tolerance Patient tolerated treatment well;Patient limited by pain    Behavior During Therapy [facility] for tasks assessed/performed              Past Medical History:  Diagnosis Date   Allergic rhinitis    Allergy    Anxiety and depression    Asthma    Fibromyalgia    Grave's disease    Headache 07/02/2014   Past Surgical History:  Procedure Laterality Date   APPENDECTOMY  2001   BREAST BIOPSY Right 11/17/2022   [place] RT BREAST BX W LOC DEV 1ST LESION IMG BX SPEC US GUIDE 11/17/2022 [facility]   CESAREAN SECTION  04/20/2011   Procedure: CESAREAN SECTION;  Surgeon: Jeani Hawking, MD;  Location: [facility];  Service: Gynecology;  Laterality: N/A;  Primary Cesarean Section with Delivery Baby Boy @ 0421   CHOLECYSTECTOMY  2006   EVACUATION BREAST HEMATOMA Right 03/13/2023   Procedure: EXCISION CHRONIC RIGHT BREAST ABSCESS;  Surgeon: Abigail Miyamoto, MD;  Location: [facility];  Service: General;  Laterality: Right;  LMA   HIP ARTHROSCOPY     Patient Active Problem List   Diagnosis Date Noted   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   Allergy with anaphylaxis due to food 05/16/2016   Allergic rhinitis due to pollen 05/16/2016   Moderate persistent asthma 05/16/2016   Right hip pain 05/01/2015   Headache 07/02/2014   Routine health maintenance 01/02/2013   S/P cesarean section 04/20/2011   Current pregnancy with history of pre-term labor 03/17/2011    PCP: Camie Patience, FNP  REFERRING PROVIDER: Camie Patience, FNP  REFERRING DIAG: Ralene Cork,  DO  THERAPY DIAG:  Stiffness of left shoulder, not elsewhere classified  Chronic left shoulder pain  Rationale for Evaluation and Treatment: Rehabilitation  ONSET DATE: 2-3 months   SUBJECTIVE:                                                                                                                                                                                      SUBJECTIVE STATEMENT: Pt reports that she has had minimal improvement so far.  She rates her  pain @9 /10    Hand dominance: Right  PERTINENT HISTORY: PMHx includes anxiety/depression, asthma, fibromyalgia, Oak' disease  PAIN:  Are you having pain? Yes: NPRS scale: 8/10 Pain location: Lt shoulder, with some radiating symptoms  Pain description: sharp, pulling, occasional paresthesia Aggravating factors: stretching/reaching, overhead reaching, bathing/dressing  Relieving factors: none identified  PRECAUTIONS: None   WEIGHT BEARING RESTRICTIONS: No  FALLS:  Has patient fallen in last 6 months? No  LIVING ENVIRONMENT: Lives with: lives with their family Lives in: House/apartment Stairs: Yes: Internal: 12 steps; none and External: 5 steps; none Has following equipment at home: None  OCCUPATION: Sedentary, desk job   PLOF: Independent  PATIENT GOALS: To have decreased pain and improved mobility; ability to return to bathing and dressing without pain.  Ability to reach to shelf without increased symptoms  NEXT MD VISIT:   OBJECTIVE:   DIAGNOSTIC FINDINGS:  None performed/available at time of evaluation.  PATIENT SURVEYS:  FOTO 48 current, 65 predicted  COGNITION: Overall cognitive status: Within functional limits for tasks assessed     SENSATION: Not tested  POSTURE: Slightly rounded shoulders, Minimal forward while seated  UPPER EXTREMITY ROM:   Active ROM Left eval  Shoulder flexion 85  Shoulder extension   Shoulder abduction 48  Shoulder adduction   Shoulder internal  rotation WNL  Shoulder external rotation 0  Elbow flexion   Elbow extension   Wrist flexion   Wrist extension   Wrist ulnar deviation   Wrist radial deviation   Wrist pronation   Wrist supination   (Blank rows = not tested)  UPPER EXTREMITY MMT: Not formally assessed at eval due to range of motion deficits  MMT Right eval Left eval  Shoulder flexion    Shoulder extension    Shoulder abduction    Shoulder adduction    Shoulder internal rotation    Shoulder external rotation    Middle trapezius    Lower trapezius    Elbow flexion    Elbow extension    Wrist flexion    Wrist extension    Wrist ulnar deviation    Wrist radial deviation    Wrist pronation    Wrist supination    Grip strength (lbs)    (Blank rows = not tested)  SHOULDER SPECIAL TESTS: None performed at evaluation due to range of motion deficits and current mobility deficits  PALPATION:  Moderate to severe tenderness to palpation throughout the left shoulder and parascapular musculature, with most tenderness with palpation to supraspinatus/infraspinatus insertion.    TODAY'S TREATMENT:   [facility] Adult PT Treatment:                                                DATE: 04/05/23 Therapeutic Exercise: Pulley flexion/scaption x2' each Pball flexion Finger ladder Manual Therapy: PROM all motions as tolerated Lt AP/inf joint mobs grade I-III Lt        [facility] Adult PT Treatment:                                                DATE: 03/28/23 Therapeutic Exercise: Pulley flexion/scaption x2' each  Pball roll on table Manual Therapy: PROM all motions as tolerated Lt AP/inf joint mobs grade I-III Lt  [facility] Adult PT Treatment:                                                DATE: 03/25/2023  Manual Therapy: Passive range of motion in all directions as patient patient  tolerated  Therapeutic Activity: Patient education and home exercise prescription as noted below     PATIENT EDUCATION: Education details: reviewed initial home exercise program; discussion of POC, prognosis and goals for skilled PT   Person educated: Patient Education method: Explanation, Demonstration, and Handouts Education comprehension: verbalized understanding, returned demonstration, and needs further education  HOME EXERCISE PROGRAM: Access Code: [email or web address] URL: [email or web address] Date: 03/25/2023 Prepared by: Mauri Reading  Exercises - Seated Shoulder Pendulum Exercise  - 2 x daily - 7 x weekly - 3 reps - 30 sec hold - Horizontal Shoulder Pendulum with Table Support  - 2 x daily - 7 x weekly - 3 reps - 30 sec hold - Seated Shoulder Flexion Towel Slide at Table Top Full Range of Motion  - 2 x daily - 7 x weekly - 1-2 sets - 10 reps - 3 sec hold - Standing Single Arm Shoulder Flexion Towel Slide at Table Top  - 2 x daily - 7 x weekly - 1-2 sets - 10 reps - 3 sec hold  Patient Education - Frozen Shoulder  ASSESSMENT:  CLINICAL IMPRESSION: Rodneisha tolerated session well with no adverse reaction.  Concentrated on PROM with flexion >105 degrees after session.   OBJECTIVE IMPAIRMENTS: decreased activity tolerance, decreased mobility, decreased ROM, decreased strength, impaired UE functional use, postural dysfunction, and pain.   ACTIVITY LIMITATIONS: carrying, lifting, sleeping, bed mobility, bathing, dressing, reach over head, and hygiene/grooming  PARTICIPATION LIMITATIONS: meal prep, cleaning, interpersonal relationship, driving, shopping, and community activity  PERSONAL FACTORS: Time since onset of injury/illness/exacerbation and 1-2 comorbidities: PMHx includes anxiety/depression, asthma, fibromyalgia, Altemus' disease  are also affecting patient's functional outcome.   REHAB POTENTIAL: Good  CLINICAL DECISION MAKING:  Stable/uncomplicated  EVALUATION COMPLEXITY: Low   GOALS: Goals reviewed with patient? Yes  SHORT TERM GOALS: Target date: 04/22/2023   Patient will be independent with initial home program for shoulder AAROM activities.  Baseline: provided at eval  Goal status: INITIAL  2.  Patient will demonstrate improved postural awareness for at least 15 minutes while seated without need for cueing from PT.   Baseline: see objective measures  Goal status: INITIAL   LONG TERM GOALS: Target date: 05/20/2023   Patient will report improved overall functional ability with FOTO score of 65 or greater.  Baseline: 48  Goal status: INITIAL  2.  Patient will demonstrate ability to perform overhead lifting of at least 5# using appropriate body mechanics and with no more than minimal pain in order to safely perform normal daily/occupational tasks.   Baseline: unable  Goal status: INITIAL  3.  Patient will demonstrate improved Lt shoulder AROM to at least 150 shoulder flexion/abduction.   Baseline:  Active ROM Left eval  Shoulder flexion 85  Shoulder extension   Shoulder abduction 48  Shoulder adduction   Shoulder internal rotation WNL  Shoulder external rotation 0   Goal status: INITIAL  4.  Patient will demonstrate improved Lt shoulder strength to at least 4/5 MMT in all directions.  Baseline: unable to formally assess  Goal status: INITIAL  5.  Patient will report ability to performing  normal bathing/dressing activities without exacerbation of symptoms.  Baseline: unable Goal status: INITIAL    PLAN:  PT FREQUENCY: 2x/week  PT DURATION: 8 weeks  PLANNED INTERVENTIONS: Therapeutic exercises, Therapeutic activity, Neuromuscular re-education, Self Care, Joint mobilization, Dry Needling, Electrical stimulation, Cryotherapy, Moist heat, Taping, Vasopneumatic device, Ionotophoresis 4mg /ml Dexamethasone, Manual therapy, and Re-evaluation  PLAN FOR NEXT SESSION: mobility only per MD  referral; PROM/AAROM, manual therapy, TPDN as indicated, modalities as appropriate, ongoing pt education regarding condition    Fredderick Phenix, PT 04/05/2023, 9:16 AM

## 2023-04-06 ENCOUNTER — Ambulatory Visit: Payer: 59

## 2023-04-06 DIAGNOSIS — M25612 Stiffness of left shoulder, not elsewhere classified: Secondary | ICD-10-CM

## 2023-04-06 DIAGNOSIS — G8929 Other chronic pain: Secondary | ICD-10-CM

## 2023-04-06 NOTE — Therapy (Signed)
OUTPATIENT PHYSICAL THERAPY TREATMENT NOTE   Patient Name: Lisa Day MRN: [id number] DOB:May 28, 1976, 47 y.o., female Today's Date: 04/06/2023  END OF SESSION:  PT End of Session - 04/06/23 0959     Visit Number 4    Number of Visits 17    Date for PT Re-Evaluation 05/20/23    Authorization Type UHC    PT Start Time 1000    PT Stop Time 1044    PT Time Calculation (min) 44 min    Activity Tolerance Patient tolerated treatment well;Patient limited by pain    Behavior During Therapy [facility] for tasks assessed/performed               Past Medical History:  Diagnosis Date   Allergic rhinitis    Allergy    Anxiety and depression    Asthma    Fibromyalgia    Grave's disease    Headache 07/02/2014   Past Surgical History:  Procedure Laterality Date   APPENDECTOMY  2001   BREAST BIOPSY Right 11/17/2022   [place] RT BREAST BX W LOC DEV 1ST LESION IMG BX SPEC US GUIDE 11/17/2022 [facility]MOGRAPHY   CESAREAN SECTION  04/20/2011   Procedure: CESAREAN SECTION;  Surgeon: Jeani Hawking, MD;  Location: [facility];  Service: Gynecology;  Laterality: N/A;  Primary Cesarean Section with Delivery Baby Boy @ 0421   CHOLECYSTECTOMY  2006   EVACUATION BREAST HEMATOMA Right 03/13/2023   Procedure: EXCISION CHRONIC RIGHT BREAST ABSCESS;  Surgeon: Abigail Miyamoto, MD;  Location: [facility];  Service: General;  Laterality: Right;  LMA   HIP ARTHROSCOPY     Patient Active Problem List   Diagnosis Date Noted   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   Allergy with anaphylaxis due to food 05/16/2016   Allergic rhinitis due to pollen 05/16/2016   Moderate persistent asthma 05/16/2016   Right hip pain 05/01/2015   Headache 07/02/2014   Routine health maintenance 01/02/2013   S/P cesarean section 04/20/2011   Current pregnancy with history of pre-term labor 03/17/2011    PCP: Camie Patience, FNP  REFERRING PROVIDER: Camie Patience, FNP  REFERRING DIAG: Ralene Cork,  DO  THERAPY DIAG:  Stiffness of left shoulder, not elsewhere classified  Chronic left shoulder pain  Rationale for Evaluation and Treatment: Rehabilitation  ONSET DATE: 2-3 months   SUBJECTIVE:                                                                                                                                                                                      SUBJECTIVE STATEMENT: Patient reports to PT with 6/10 today.    Hand dominance: Right  PERTINENT HISTORY: PMHx includes anxiety/depression, asthma, fibromyalgia, [place]' disease  PAIN:  Are you having pain? Yes: NPRS scale: 8/10 Pain location: Lt shoulder, with some radiating symptoms  Pain description: sharp, pulling, occasional paresthesia Aggravating factors: stretching/reaching, overhead reaching, bathing/dressing  Relieving factors: none identified  PRECAUTIONS: None   WEIGHT BEARING RESTRICTIONS: No  FALLS:  Has patient fallen in last 6 months? No  LIVING ENVIRONMENT: Lives with: lives with their family Lives in: House/apartment Stairs: Yes: Internal: 12 steps; none and External: 5 steps; none Has following equipment at home: None  OCCUPATION: Sedentary, desk job   PLOF: Independent  PATIENT GOALS: To have decreased pain and improved mobility; ability to return to bathing and dressing without pain.  Ability to reach to shelf without increased symptoms  NEXT MD VISIT:   OBJECTIVE:   DIAGNOSTIC FINDINGS:  None performed/available at time of evaluation.  PATIENT SURVEYS:  FOTO 48 current, 65 predicted  COGNITION: Overall cognitive status: Within functional limits for tasks assessed     SENSATION: Not tested  POSTURE: Slightly rounded shoulders, Minimal forward while seated  UPPER EXTREMITY ROM:   Active ROM Left eval  Shoulder flexion 85  Shoulder extension   Shoulder abduction 48  Shoulder adduction   Shoulder internal rotation WNL  Shoulder external rotation 0  Elbow  flexion   Elbow extension   Wrist flexion   Wrist extension   Wrist ulnar deviation   Wrist radial deviation   Wrist pronation   Wrist supination   (Blank rows = not tested)  UPPER EXTREMITY MMT: Not formally assessed at eval due to range of motion deficits  MMT Right eval Left eval  Shoulder flexion    Shoulder extension    Shoulder abduction    Shoulder adduction    Shoulder internal rotation    Shoulder external rotation    Middle trapezius    Lower trapezius    Elbow flexion    Elbow extension    Wrist flexion    Wrist extension    Wrist ulnar deviation    Wrist radial deviation    Wrist pronation    Wrist supination    Grip strength (lbs)    (Blank rows = not tested)  SHOULDER SPECIAL TESTS: None performed at evaluation due to range of motion deficits and current mobility deficits  PALPATION:  Moderate to severe tenderness to palpation throughout the left shoulder and parascapular musculature, with most tenderness with palpation to supraspinatus/infraspinatus insertion.    TODAY'S TREATMENT:   [facility] Adult PT Treatment:                                                DATE: 04/06/2023  Therapeutic Exercise: Pulley flexion/scaption x2' each Pball flexion on table x 10 Finger ladder x 5ea flexion/scaption  Seated table slides flexion with pillow case x 20   Manual Therapy: PROM all motions as tolerated Lt AP/inf joint mobs grade I-III Lt Long axis distraction    [facility] Adult PT Treatment:                                                DATE: 04/05/23 Therapeutic Exercise: Pulley flexion/scaption x2' each Pball flexion Finger ladder Manual Therapy: PROM all motions  as tolerated Lt AP/inf joint mobs grade I-III Lt         [facility] Adult PT Treatment:                                                DATE: 03/28/23 Therapeutic Exercise: Pulley flexion/scaption x2' each Pball roll on table Manual Therapy: PROM all motions as tolerated Lt AP/inf joint mobs grade  I-III Lt                                                                                                                                      PATIENT EDUCATION: Education details: reviewed initial home exercise program; discussion of POC, prognosis and goals for skilled PT   Person educated: Patient Education method: Explanation, Demonstration, and Handouts Education comprehension: verbalized understanding, returned demonstration, and needs further education  HOME EXERCISE PROGRAM: Access Code: [email or web address] URL: [email or web address] Date: 03/25/2023 Prepared by: Mauri Reading  Exercises - Seated Shoulder Pendulum Exercise  - 2 x daily - 7 x weekly - 3 reps - 30 sec hold - Horizontal Shoulder Pendulum with Table Support  - 2 x daily - 7 x weekly - 3 reps - 30 sec hold - Seated Shoulder Flexion Towel Slide at Table Top Full Range of Motion  - 2 x daily - 7 x weekly - 1-2 sets - 10 reps - 3 sec hold - Standing Single Arm Shoulder Flexion Towel Slide at Table Top  - 2 x daily - 7 x weekly - 1-2 sets - 10 reps - 3 sec hold  Patient Education - Frozen Shoulder  ASSESSMENT:  CLINICAL IMPRESSION:  Tayona is demonstrating improved tolerance of AAROM and continues to reaching >105 degrees of shoulder flexion/scaption. She has most difficulty with shoulder abduction. We will continue to progress with ROM as tolerated.    OBJECTIVE IMPAIRMENTS: decreased activity tolerance, decreased mobility, decreased ROM, decreased strength, impaired UE functional use, postural dysfunction, and pain.   ACTIVITY LIMITATIONS: carrying, lifting, sleeping, bed mobility, bathing, dressing, reach over head, and hygiene/grooming  PARTICIPATION LIMITATIONS: meal prep, cleaning, interpersonal relationship, driving, shopping, and community activity  PERSONAL FACTORS: Time since onset of injury/illness/exacerbation and 1-2 comorbidities: PMHx includes anxiety/depression, asthma, fibromyalgia, Friis'  disease  are also affecting patient's functional outcome.   REHAB POTENTIAL: Good  CLINICAL DECISION MAKING: Stable/uncomplicated  EVALUATION COMPLEXITY: Low   GOALS: Goals reviewed with patient? Yes  SHORT TERM GOALS: Target date: 04/22/2023   Patient will be independent with initial home program for shoulder AAROM activities.  Baseline: provided at eval  Goal status: INITIAL  2.  Patient will demonstrate improved postural awareness for at least 15 minutes while seated without need for cueing from PT.   Baseline: see objective measures  Goal status: INITIAL   LONG TERM GOALS:  Target date: 05/20/2023   Patient will report improved overall functional ability with FOTO score of 65 or greater.  Baseline: 48  Goal status: INITIAL  2.  Patient will demonstrate ability to perform overhead lifting of at least 5# using appropriate body mechanics and with no more than minimal pain in order to safely perform normal daily/occupational tasks.   Baseline: unable  Goal status: INITIAL  3.  Patient will demonstrate improved Lt shoulder AROM to at least 150 shoulder flexion/abduction.   Baseline:  Active ROM Left eval  Shoulder flexion 85  Shoulder extension   Shoulder abduction 48  Shoulder adduction   Shoulder internal rotation WNL  Shoulder external rotation 0   Goal status: INITIAL  4.  Patient will demonstrate improved Lt shoulder strength to at least 4/5 MMT in all directions.  Baseline: unable to formally assess  Goal status: INITIAL  5.  Patient will report ability to performing normal bathing/dressing activities without exacerbation of symptoms.  Baseline: unable Goal status: INITIAL    PLAN:  PT FREQUENCY: 2x/week  PT DURATION: 8 weeks  PLANNED INTERVENTIONS: Therapeutic exercises, Therapeutic activity, Neuromuscular re-education, Self Care, Joint mobilization, Dry Needling, Electrical stimulation, Cryotherapy, Moist heat, Taping, Vasopneumatic device,  Ionotophoresis 4mg /ml Dexamethasone, Manual therapy, and Re-evaluation  PLAN FOR NEXT SESSION: mobility only per MD referral; PROM/AAROM, manual therapy, TPDN as indicated, modalities as appropriate, ongoing pt education regarding condition    Mauri Reading, PT, DPT 04/06/2023, 11:02 AM

## 2023-04-07 ENCOUNTER — Ambulatory Visit (AMBULATORY_SURGERY_CENTER): Payer: 59 | Admitting: Internal Medicine

## 2023-04-07 ENCOUNTER — Encounter: Payer: Self-pay | Admitting: Internal Medicine

## 2023-04-07 VITALS — BP 131/82 | HR 61 | Temp 97.1°F | Resp 13 | Ht 63.0 in | Wt 145.0 lb

## 2023-04-07 DIAGNOSIS — D125 Benign neoplasm of sigmoid colon: Secondary | ICD-10-CM

## 2023-04-07 DIAGNOSIS — Z1211 Encounter for screening for malignant neoplasm of colon: Secondary | ICD-10-CM

## 2023-04-07 DIAGNOSIS — D122 Benign neoplasm of ascending colon: Secondary | ICD-10-CM

## 2023-04-07 DIAGNOSIS — K635 Polyp of colon: Secondary | ICD-10-CM

## 2023-04-07 MED ORDER — SODIUM CHLORIDE 0.9 % IV SOLN: 500.0000 mL | Freq: Once | INTRAVENOUS | Status: DC

## 2023-04-07 NOTE — Progress Notes (Signed)
Called to room to assist during endoscopic procedure.  Patient ID and intended procedure confirmed with present staff. Received instructions for my participation in the procedure from the performing physician.  

## 2023-04-07 NOTE — Op Note (Signed)
[facility] Patient Name: Lisa Day Procedure Date: 04/07/2023 11:37 AM MRN: [id number] Endoscopist: Wilhemina Bonito. Marina Goodell , MD, [id number] Age: 47 Referring MD:  Date of Birth: 09-19-1975 Gender: Female Account #: [id number] Procedure:                Colonoscopy with cold snare polypectomy x 2 Indications:              Screening for colorectal malignant neoplasm Medicines:                Monitored Anesthesia Care Procedure:                Pre-Anesthesia Assessment:                           - Prior to the procedure, a History and Physical                            was performed, and patient medications and                            allergies were reviewed. The patient's tolerance of                            previous anesthesia was also reviewed. The risks                            and benefits of the procedure and the sedation                            options and risks were discussed with the patient.                            All questions were answered, and informed consent                            was obtained. Prior Anticoagulants: The patient has                            taken no anticoagulant or antiplatelet agents. ASA                            Grade Assessment: II - A patient with mild systemic                            disease. After reviewing the risks and benefits,                            the patient was deemed in satisfactory condition to                            undergo the procedure.                           After obtaining informed consent, the colonoscope  was passed under direct vision. Throughout the                            procedure, the patient's blood pressure, pulse, and                            oxygen saturations were monitored continuously. The                            CF [id number] #[id number] was introduced through the anus                            and advanced to the the cecum, identified by                             appendiceal orifice and ileocecal valve. The                            ileocecal valve, appendiceal orifice, and rectum                            were photographed. The quality of the bowel                            preparation was excellent. The colonoscopy was                            performed without difficulty. The patient tolerated                            the procedure well. The bowel preparation used was                            SUPREP via split dose instruction. Scope In: 11:51:02 AM Scope Out: 12:06:09 PM Scope Withdrawal Time: 0 hours 13 minutes 35 seconds  Total Procedure Duration: 0 hours 15 minutes 7 seconds  Findings:                 Two polyps were found in the sigmoid colon and                            ascending colon. The polyps were 2 to 5 mm in size.                            These polyps were removed with a cold snare.                            Resection and retrieval were complete.                           A few diverticula were found in the right colon.                           The exam  was otherwise without abnormality on                            direct and retroflexion views. Complications:            No immediate complications. Estimated blood loss:                            None. Estimated Blood Loss:     Estimated blood loss: none. Impression:               - Two 2 to 5 mm polyps in the sigmoid colon and in                            the ascending colon, removed with a cold snare.                            Resected and retrieved.                           - The the right colon revealed a few diverticula.                            The examination was otherwise normal on direct and                            retroflexion views. Recommendation:           - Repeat colonoscopy in 7 years for surveillance.                           - Patient has a contact number available for                            emergencies. The signs and  symptoms of potential                            delayed complications were discussed with the                            patient. Return to normal activities tomorrow.                            Written discharge instructions were provided to the                            patient.                           - Resume previous diet.                           - Continue present medications.                           - Await pathology results. Wilhemina Bonito. Marina Goodell, MD 04/07/2023 12:11:17 PM This report  has been signed electronically.

## 2023-04-07 NOTE — Progress Notes (Signed)
HISTORY OF PRESENT ILLNESS:  Lisa Day is a 47 y.o. female who is sent today for screening colonoscopy.  No complaints  REVIEW OF SYSTEMS:  All non-GI ROS negative except for  Past Medical History:  Diagnosis Date   Allergic rhinitis    Allergy    Anxiety and depression    Asthma    Fibromyalgia    Grave's disease    Headache 07/02/2014    Past Surgical History:  Procedure Laterality Date   APPENDECTOMY  2001   BREAST BIOPSY Right 11/17/2022   [place] RT BREAST BX W LOC DEV 1ST LESION IMG BX SPEC US GUIDE 11/17/2022 [facility]   CESAREAN SECTION  04/20/2011   Procedure: CESAREAN SECTION;  Surgeon: Jeani Hawking, MD;  Location: [facility];  Service: Gynecology;  Laterality: N/A;  Primary Cesarean Section with Delivery Baby Boy @ 0421   CHOLECYSTECTOMY  2006   EVACUATION BREAST HEMATOMA Right 03/13/2023   Procedure: EXCISION CHRONIC RIGHT BREAST ABSCESS;  Surgeon: Abigail Miyamoto, MD;  Location: [facility];  Service: General;  Laterality: Right;  LMA   HIP ARTHROSCOPY      Social History [name]  reports that she has never smoked. She has never used smokeless tobacco. She reports that she does not drink alcohol and does not use drugs.  family history includes Allergic rhinitis in her brother and father; Asthma in her father; Breast cancer in her maternal grandmother; Esophageal cancer in her maternal grandmother; Hypertension in her mother; Migraines in her brother and sister.  Allergies  Allergen Reactions   Shrimp [Shellfish Allergy] Hives, Shortness Of Breath and Swelling   Watermelon Concentrate Hives, Shortness Of Breath and Swelling   Gadolinium Derivatives Nausea And Vomiting   Latex Rash       PHYSICAL EXAMINATION: Vital signs: BP 126/83   Pulse 65   Temp (!) 97.1 F (36.2 C)   Ht 5\' 3"  (1.6 m)   Wt 145 lb (65.8 kg)   SpO2 100%   BMI 25.69 kg/m  General: Well-developed, well-nourished, no acute distress HEENT: Sclerae are  anicteric, conjunctiva pink. Oral mucosa intact Lungs: Clear Heart: Regular Abdomen: soft, nontender, nondistended, no obvious ascites, no peritoneal signs, normal bowel sounds. No organomegaly. Extremities: No edema Psychiatric: alert and oriented x3. Cooperative     ASSESSMENT:  Colon cancer screening   PLAN:   Screening colonoscopy

## 2023-04-07 NOTE — Progress Notes (Signed)
Pt's states no medical or surgical changes since previsit or office visit. 

## 2023-04-07 NOTE — Patient Instructions (Signed)
YOU HAD AN ENDOSCOPIC PROCEDURE TODAY AT THE [facility]:   Refer to the procedure report that was given to you for any specific questions about what was found during the examination.  If the procedure report does not answer your questions, please call your gastroenterologist to clarify.  If you requested that your care partner not be given the details of your procedure findings, then the procedure report has been included in a sealed envelope for you to review at your convenience later.  **handouts given on polyps and diverticulosis**  YOU SHOULD EXPECT: Some feelings of bloating in the abdomen. Passage of more gas than usual.  Walking can help get rid of the air that was put into your GI tract during the procedure and reduce the bloating. If you had a lower endoscopy (such as a colonoscopy or flexible sigmoidoscopy) you may notice spotting of blood in your stool or on the toilet paper. If you underwent a bowel prep for your procedure, you may not have a normal bowel movement for a few days.  Please Note:  You might notice some irritation and congestion in your nose or some drainage.  This is from the oxygen used during your procedure.  There is no need for concern and it should clear up in a day or so.  SYMPTOMS TO REPORT IMMEDIATELY:  Following lower endoscopy (colonoscopy or flexible sigmoidoscopy):  Excessive amounts of blood in the stool  Significant tenderness or worsening of abdominal pains  Swelling of the abdomen that is new, acute  Fever of 100F or higher  For urgent or emergent issues, a gastroenterologist can be reached at any hour by calling [phone]. Do not use MyChart messaging for urgent concerns.    DIET:  We do recommend a small meal at first, but then you may proceed to your regular diet.  Drink plenty of fluids but you should avoid alcoholic beverages for 24 hours.  ACTIVITY:  You should plan to take it easy for the rest of today and you should NOT DRIVE  or use heavy machinery until tomorrow (because of the sedation medicines used during the test).    FOLLOW UP: Our staff will call the number listed on your records the next business day following your procedure.  We will call around 7:15- 8:00 am to check on you and address any questions or concerns that you may have regarding the information given to you following your procedure. If we do not reach you, we will leave a message.     If any biopsies were taken you will be contacted by phone or by letter within the next 1-3 weeks.  Please call us at [phone] if you have not heard about the biopsies in 3 weeks.    SIGNATURES/CONFIDENTIALITY: You and/or your care partner have signed paperwork which will be entered into your electronic medical record.  These signatures attest to the fact that that the information above on your After Visit Summary has been reviewed and is understood.  Full responsibility of the confidentiality of this discharge information lies with you and/or your care-partner.

## 2023-04-10 ENCOUNTER — Telehealth: Payer: Self-pay | Admitting: *Deleted

## 2023-04-10 NOTE — Telephone Encounter (Signed)
No answer on  follow up call. Left message.   

## 2023-04-11 ENCOUNTER — Ambulatory Visit: Payer: 59

## 2023-04-12 NOTE — Therapy (Signed)
OUTPATIENT PHYSICAL THERAPY TREATMENT NOTE   Patient Name: Lisa Day MRN: [id number] DOB:1975/12/31, 47 y.o., female Today's Date: 04/13/2023  END OF SESSION:  PT End of Session - 04/13/23 0912     Visit Number 5    Number of Visits 17    Date for PT Re-Evaluation 05/20/23    Authorization Type [employer]    PT Start Time 0915    PT Stop Time 0955    PT Time Calculation (min) 40 min    Activity Tolerance Patient tolerated treatment well;Patient limited by pain    Behavior During Therapy [facility] for tasks assessed/performed             Past Medical History:  Diagnosis Date   Allergic rhinitis    Allergy    Anxiety and depression    Asthma    Fibromyalgia    Grave's disease    Headache 07/02/2014   Past Surgical History:  Procedure Laterality Date   APPENDECTOMY  2001   BREAST BIOPSY Right 11/17/2022   [place] RT BREAST BX W LOC DEV 1ST LESION IMG BX SPEC US GUIDE 11/17/2022 [facility]   CESAREAN SECTION  04/20/2011   Procedure: CESAREAN SECTION;  Surgeon: Jeani Hawking, MD;  Location: [facility];  Service: Gynecology;  Laterality: N/A;  Primary Cesarean Section with Delivery Baby Boy @ 0421   CHOLECYSTECTOMY  2006   EVACUATION BREAST HEMATOMA Right 03/13/2023   Procedure: EXCISION CHRONIC RIGHT BREAST ABSCESS;  Surgeon: Abigail Miyamoto, MD;  Location: [facility];  Service: General;  Laterality: Right;  LMA   HIP ARTHROSCOPY     Patient Active Problem List   Diagnosis Date Noted   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   Allergy with anaphylaxis due to food 05/16/2016   Allergic rhinitis due to pollen 05/16/2016   Moderate persistent asthma 05/16/2016   Right hip pain 05/01/2015   Headache 07/02/2014   Routine health maintenance 01/02/2013   S/P cesarean section 04/20/2011   Current pregnancy with history of pre-term labor 03/17/2011    PCP: Camie Patience, FNP  REFERRING PROVIDER: Camie Patience, FNP  REFERRING DIAG: Ralene Cork,  DO  THERAPY DIAG:  Stiffness of left shoulder, not elsewhere classified  Chronic left shoulder pain  Rationale for Evaluation and Treatment: Rehabilitation  ONSET DATE: 2-3 months   SUBJECTIVE:                                                                                                                                                                                      SUBJECTIVE STATEMENT: Patient reports continued pain in her Lt shoulder.   Hand dominance: Right  PERTINENT  HISTORY: PMHx includes anxiety/depression, asthma, fibromyalgia, Grennan' disease  PAIN:  Are you having pain? Yes: NPRS scale: 6/10 Pain location: Lt shoulder, with some radiating symptoms  Pain description: sharp, pulling, occasional paresthesia Aggravating factors: stretching/reaching, overhead reaching, bathing/dressing  Relieving factors: none identified  PRECAUTIONS: None   WEIGHT BEARING RESTRICTIONS: No  FALLS:  Has patient fallen in last 6 months? No  LIVING ENVIRONMENT: Lives with: lives with their family Lives in: House/apartment Stairs: Yes: Internal: 12 steps; none and External: 5 steps; none Has following equipment at home: None  OCCUPATION: Sedentary, desk job   PLOF: Independent  PATIENT GOALS: To have decreased pain and improved mobility; ability to return to bathing and dressing without pain.  Ability to reach to shelf without increased symptoms  NEXT MD VISIT:   OBJECTIVE:   DIAGNOSTIC FINDINGS:  None performed/available at time of evaluation.  PATIENT SURVEYS:  FOTO 48 current, 65 predicted  COGNITION: Overall cognitive status: Within functional limits for tasks assessed     SENSATION: Not tested  POSTURE: Slightly rounded shoulders, Minimal forward while seated  UPPER EXTREMITY ROM:   Active ROM Left eval Left 04/13/23  Shoulder flexion 85 AAROM: 105  Shoulder extension    Shoulder abduction 48   Shoulder adduction    Shoulder internal rotation  WNL   Shoulder external rotation 0   Elbow flexion    Elbow extension    Wrist flexion    Wrist extension    Wrist ulnar deviation    Wrist radial deviation    Wrist pronation    Wrist supination    (Blank rows = not tested)  UPPER EXTREMITY MMT: Not formally assessed at eval due to range of motion deficits  MMT Right eval Left eval  Shoulder flexion    Shoulder extension    Shoulder abduction    Shoulder adduction    Shoulder internal rotation    Shoulder external rotation    Middle trapezius    Lower trapezius    Elbow flexion    Elbow extension    Wrist flexion    Wrist extension    Wrist ulnar deviation    Wrist radial deviation    Wrist pronation    Wrist supination    Grip strength (lbs)    (Blank rows = not tested)  SHOULDER SPECIAL TESTS: None performed at evaluation due to range of motion deficits and current mobility deficits  PALPATION:  Moderate to severe tenderness to palpation throughout the left shoulder and parascapular musculature, with most tenderness with palpation to supraspinatus/infraspinatus insertion.    TODAY'S TREATMENT:  [facility] Adult PT Treatment:                                                DATE: 04/13/23 Therapeutic Exercise: Pulley flexion/scaption x2' each Pball flexion on table x 10 Finger ladder x 5ea flexion/scaption  Seated ER with dowel AAROM 2x10 Seated scaption with dowel AAROM 2x10 Supine flexion AAROM with dowel 2x10 Supine abduction AAROM with dowel x10 Manual Therapy: PROM all motions as tolerated Lt AP/inf joint mobs grade I-III Lt   [facility] Adult PT Treatment:  DATE: 04/06/2023  Therapeutic Exercise: Pulley flexion/scaption x2' each Pball flexion on table x 10 Finger ladder x 5ea flexion/scaption  Seated table slides flexion with pillow case x 20   Manual Therapy: PROM all motions as tolerated Lt AP/inf joint mobs grade I-III Lt Long axis distraction    [facility]  Adult PT Treatment:                                                DATE: 04/05/23 Therapeutic Exercise: Pulley flexion/scaption x2' each Pball flexion Finger ladder Manual Therapy: PROM all motions as tolerated Lt AP/inf joint mobs grade I-III Lt                                                                                                                                    PATIENT EDUCATION: Education details: reviewed initial home exercise program; discussion of POC, prognosis and goals for skilled PT   Person educated: Patient Education method: Explanation, Demonstration, and Handouts Education comprehension: verbalized understanding, returned demonstration, and needs further education  HOME EXERCISE PROGRAM: Access Code: [email or web address] URL: [email or web address] Date: 03/25/2023 Prepared by: Mauri Reading  Exercises - Seated Shoulder Pendulum Exercise  - 2 x daily - 7 x weekly - 3 reps - 30 sec hold - Horizontal Shoulder Pendulum with Table Support  - 2 x daily - 7 x weekly - 3 reps - 30 sec hold - Seated Shoulder Flexion Towel Slide at Table Top Full Range of Motion  - 2 x daily - 7 x weekly - 1-2 sets - 10 reps - 3 sec hold - Standing Single Arm Shoulder Flexion Towel Slide at Table Top  - 2 x daily - 7 x weekly - 1-2 sets - 10 reps - 3 sec hold  Patient Education - Frozen Shoulder  ASSESSMENT:  CLINICAL IMPRESSION: Patient presents to PT reporting continued pain in her Lt shoulder and that she has noticed some improvement in her ability to sleep, waking every 2-3 hours from pain but able to go back to sleep. Session today continued to focus on shoulder ROM with more AAROM today to moderate effect, she is somewhat limited by pain, but is able to complete all the exercises. Patient continues to benefit from skilled PT services and should be progressed as able to improve functional independence.    OBJECTIVE IMPAIRMENTS: decreased activity tolerance, decreased  mobility, decreased ROM, decreased strength, impaired UE functional use, postural dysfunction, and pain.   ACTIVITY LIMITATIONS: carrying, lifting, sleeping, bed mobility, bathing, dressing, reach over head, and hygiene/grooming  PARTICIPATION LIMITATIONS: meal prep, cleaning, interpersonal relationship, driving, shopping, and community activity  PERSONAL FACTORS: Time since onset of injury/illness/exacerbation and 1-2 comorbidities: PMHx includes anxiety/depression, asthma, fibromyalgia, Orebaugh' disease  are also affecting patient's functional outcome.   REHAB POTENTIAL: Good  CLINICAL DECISION MAKING: Stable/uncomplicated  EVALUATION COMPLEXITY: Low   GOALS: Goals reviewed with patient? Yes  SHORT TERM GOALS: Target date: 04/22/2023   Patient will be independent with initial home program for shoulder AAROM activities.  Baseline: provided at eval  Goal status: INITIAL  2.  Patient will demonstrate improved postural awareness for at least 15 minutes while seated without need for cueing from PT.   Baseline: see objective measures  Goal status: INITIAL   LONG TERM GOALS: Target date: 05/20/2023   Patient will report improved overall functional ability with FOTO score of 65 or greater.  Baseline: 48  Goal status: INITIAL  2.  Patient will demonstrate ability to perform overhead lifting of at least 5# using appropriate body mechanics and with no more than minimal pain in order to safely perform normal daily/occupational tasks.   Baseline: unable  Goal status: INITIAL  3.  Patient will demonstrate improved Lt shoulder AROM to at least 150 shoulder flexion/abduction.   Baseline:  Active ROM Left eval  Shoulder flexion 85  Shoulder extension   Shoulder abduction 48  Shoulder adduction   Shoulder internal rotation WNL  Shoulder external rotation 0   Goal status: INITIAL  4.  Patient will demonstrate improved Lt shoulder strength to at least 4/5 MMT in all directions.   Baseline: unable to formally assess  Goal status: INITIAL  5.  Patient will report ability to performing normal bathing/dressing activities without exacerbation of symptoms.  Baseline: unable Goal status: INITIAL    PLAN:  PT FREQUENCY: 2x/week  PT DURATION: 8 weeks  PLANNED INTERVENTIONS: Therapeutic exercises, Therapeutic activity, Neuromuscular re-education, Self Care, Joint mobilization, Dry Needling, Electrical stimulation, Cryotherapy, Moist heat, Taping, Vasopneumatic device, Ionotophoresis 4mg /ml Dexamethasone, Manual therapy, and Re-evaluation  PLAN FOR NEXT SESSION: mobility only per MD referral; PROM/AAROM, manual therapy, TPDN as indicated, modalities as appropriate, ongoing pt education regarding condition    Berta Minor, PTA 04/13/2023, 9:55 AM

## 2023-04-13 ENCOUNTER — Ambulatory Visit: Payer: 59

## 2023-04-13 DIAGNOSIS — M25612 Stiffness of left shoulder, not elsewhere classified: Secondary | ICD-10-CM | POA: Diagnosis not present

## 2023-04-13 DIAGNOSIS — G8929 Other chronic pain: Secondary | ICD-10-CM

## 2023-04-17 ENCOUNTER — Encounter: Payer: Self-pay | Admitting: Internal Medicine

## 2023-04-18 ENCOUNTER — Ambulatory Visit: Payer: 59 | Admitting: Physical Therapy

## 2023-04-18 ENCOUNTER — Telehealth: Payer: Self-pay

## 2023-04-18 ENCOUNTER — Encounter: Payer: Self-pay | Admitting: Physical Therapy

## 2023-04-18 DIAGNOSIS — G8929 Other chronic pain: Secondary | ICD-10-CM

## 2023-04-18 DIAGNOSIS — M25612 Stiffness of left shoulder, not elsewhere classified: Secondary | ICD-10-CM

## 2023-04-18 NOTE — Telephone Encounter (Signed)
Error -MJ

## 2023-04-18 NOTE — Therapy (Signed)
OUTPATIENT PHYSICAL THERAPY TREATMENT NOTE   Patient Name: Lisa Day MRN: [id number] DOB:02/12/1976, 47 y.o., female Today's Date: 04/18/2023  END OF SESSION:  PT End of Session - 04/18/23 1000     Visit Number 6    Number of Visits 17    Date for PT Re-Evaluation 05/20/23    Authorization Type [employer]    PT Start Time 1000    PT Stop Time 1041    PT Time Calculation (min) 41 min    Activity Tolerance Patient tolerated treatment well;Patient limited by pain    Behavior During Therapy [facility] for tasks assessed/performed             Past Medical History:  Diagnosis Date   Allergic rhinitis    Allergy    Anxiety and depression    Asthma    Fibromyalgia    Grave's disease    Headache 07/02/2014   Past Surgical History:  Procedure Laterality Date   APPENDECTOMY  2001   BREAST BIOPSY Right 11/17/2022   [place] RT BREAST BX W LOC DEV 1ST LESION IMG BX SPEC US GUIDE 11/17/2022 [facility]   CESAREAN SECTION  04/20/2011   Procedure: CESAREAN SECTION;  Surgeon: Jeani Hawking, MD;  Location: [facility];  Service: Gynecology;  Laterality: N/A;  Primary Cesarean Section with Delivery Baby Boy @ 0421   CHOLECYSTECTOMY  2006   EVACUATION BREAST HEMATOMA Right 03/13/2023   Procedure: EXCISION CHRONIC RIGHT BREAST ABSCESS;  Surgeon: Abigail Miyamoto, MD;  Location: [facility];  Service: General;  Laterality: Right;  LMA   HIP ARTHROSCOPY     Patient Active Problem List   Diagnosis Date Noted   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   Allergy with anaphylaxis due to food 05/16/2016   Allergic rhinitis due to pollen 05/16/2016   Moderate persistent asthma 05/16/2016   Right hip pain 05/01/2015   Headache 07/02/2014   Routine health maintenance 01/02/2013   S/P cesarean section 04/20/2011   Current pregnancy with history of pre-term labor 03/17/2011    PCP: Camie Patience, FNP  REFERRING PROVIDER: Camie Patience, FNP  REFERRING DIAG: Ralene Cork,  DO  THERAPY DIAG:  Stiffness of left shoulder, not elsewhere classified  Chronic left shoulder pain  Rationale for Evaluation and Treatment: Rehabilitation  ONSET DATE: 2-3 months   SUBJECTIVE:                                                                                                                                                                                      SUBJECTIVE STATEMENT: Pt reports that she has had improvement in her flexion ROM.  She rates her  pain at 6/10 currently.   Hand dominance: Right  PERTINENT HISTORY: PMHx includes anxiety/depression, asthma, fibromyalgia, Vitrano' disease  PAIN:  Are you having pain? Yes: NPRS scale: 6/10 Pain location: Lt shoulder, with some radiating symptoms  Pain description: sharp, pulling, occasional paresthesia Aggravating factors: stretching/reaching, overhead reaching, bathing/dressing  Relieving factors: none identified  PRECAUTIONS: None   WEIGHT BEARING RESTRICTIONS: No  FALLS:  Has patient fallen in last 6 months? No  LIVING ENVIRONMENT: Lives with: lives with their family Lives in: House/apartment Stairs: Yes: Internal: 12 steps; none and External: 5 steps; none Has following equipment at home: None  OCCUPATION: Sedentary, desk job   PLOF: Independent  PATIENT GOALS: To have decreased pain and improved mobility; ability to return to bathing and dressing without pain.  Ability to reach to shelf without increased symptoms  NEXT MD VISIT:   OBJECTIVE:   DIAGNOSTIC FINDINGS:  None performed/available at time of evaluation.  PATIENT SURVEYS:  FOTO 48 current, 65 predicted  COGNITION: Overall cognitive status: Within functional limits for tasks assessed     SENSATION: Not tested  POSTURE: Slightly rounded shoulders, Minimal forward while seated  UPPER EXTREMITY ROM:   Active ROM Left eval Left 04/13/23  Shoulder flexion 85 AAROM: 105  Shoulder extension    Shoulder abduction 48    Shoulder adduction    Shoulder internal rotation WNL   Shoulder external rotation 0   Elbow flexion    Elbow extension    Wrist flexion    Wrist extension    Wrist ulnar deviation    Wrist radial deviation    Wrist pronation    Wrist supination    (Blank rows = not tested)  UPPER EXTREMITY MMT: Not formally assessed at eval due to range of motion deficits  MMT Right eval Left eval  Shoulder flexion    Shoulder extension    Shoulder abduction    Shoulder adduction    Shoulder internal rotation    Shoulder external rotation    Middle trapezius    Lower trapezius    Elbow flexion    Elbow extension    Wrist flexion    Wrist extension    Wrist ulnar deviation    Wrist radial deviation    Wrist pronation    Wrist supination    Grip strength (lbs)    (Blank rows = not tested)  SHOULDER SPECIAL TESTS: None performed at evaluation due to range of motion deficits and current mobility deficits  PALPATION:  Moderate to severe tenderness to palpation throughout the left shoulder and parascapular musculature, with most tenderness with palpation to supraspinatus/infraspinatus insertion.    TODAY'S TREATMENT:  [facility] Adult PT Treatment:                                                DATE: 04/18/23 Therapeutic Exercise: Pulley flexion/scaption x2' each Pball flexion/scaption on table x 10 Finger ladder x 5ea flexion/scaption  Seated ER with dowel AAROM 2x10 Seated scaption with dowel AAROM 2x10 Supine flexion AAROM with dowel 2x10 standing abduction AAROM with dowel 2x10 Manual Therapy: PROM all motions as tolerated Lt AP/inf joint mobs grade I-III Lt  Hot pack applied during manual  Mauri Reading, DTP finished last 10 min of session with pt   [facility] Adult PT Treatment:  DATE: 04/06/2023  Therapeutic Exercise: Pulley flexion/scaption x2' each Pball flexion on table x 10 Finger ladder x 5ea flexion/scaption  Seated table  slides flexion with pillow case x 20   Manual Therapy: PROM all motions as tolerated Lt AP/inf joint mobs grade I-III Lt Long axis distraction    [facility] Adult PT Treatment:                                                DATE: 04/05/23 Therapeutic Exercise: Pulley flexion/scaption x2' each Pball flexion Finger ladder Manual Therapy: PROM all motions as tolerated Lt AP/inf joint mobs grade I-III Lt                                                                                                                                    PATIENT EDUCATION: Education details: reviewed initial home exercise program; discussion of POC, prognosis and goals for skilled PT   Person educated: Patient Education method: Explanation, Demonstration, and Handouts Education comprehension: verbalized understanding, returned demonstration, and needs further education  HOME EXERCISE PROGRAM: Access Code: [email or web address] URL: [email or web address] Date: 03/25/2023 Prepared by: Mauri Reading  Exercises - Seated Shoulder Pendulum Exercise  - 2 x daily - 7 x weekly - 3 reps - 30 sec hold - Horizontal Shoulder Pendulum with Table Support  - 2 x daily - 7 x weekly - 3 reps - 30 sec hold - Seated Shoulder Flexion Towel Slide at Table Top Full Range of Motion  - 2 x daily - 7 x weekly - 1-2 sets - 10 reps - 3 sec hold - Standing Single Arm Shoulder Flexion Towel Slide at Table Top  - 2 x daily - 7 x weekly - 1-2 sets - 10 reps - 3 sec hold  Patient Education - Frozen Shoulder  ASSESSMENT:  CLINICAL IMPRESSION: Lisa Day is progressing well with therapy.  Her flexion AAROM improved to 120 degrees today showing continued progress.  She remains limited in capsular pattern and will benefit from continued PT to address ROM limitations and pain.   OBJECTIVE IMPAIRMENTS: decreased activity tolerance, decreased mobility, decreased ROM, decreased strength, impaired UE functional use, postural dysfunction, and  pain.   ACTIVITY LIMITATIONS: carrying, lifting, sleeping, bed mobility, bathing, dressing, reach over head, and hygiene/grooming  PARTICIPATION LIMITATIONS: meal prep, cleaning, interpersonal relationship, driving, shopping, and community activity  PERSONAL FACTORS: Time since onset of injury/illness/exacerbation and 1-2 comorbidities: PMHx includes anxiety/depression, asthma, fibromyalgia, Copeman' disease  are also affecting patient's functional outcome.   REHAB POTENTIAL: Good  CLINICAL DECISION MAKING: Stable/uncomplicated  EVALUATION COMPLEXITY: Low   GOALS: Goals reviewed with patient? Yes  SHORT TERM GOALS: Target date: 04/22/2023   Patient will be independent with initial home program for shoulder AAROM activities.  Baseline: provided at eval  Goal status: INITIAL  2.  Patient will demonstrate improved postural awareness for at least 15 minutes while seated without need for cueing from PT.   Baseline: see objective measures  Goal status: INITIAL   LONG TERM GOALS: Target date: 05/20/2023   Patient will report improved overall functional ability with FOTO score of 65 or greater.  Baseline: 48  Goal status: INITIAL  2.  Patient will demonstrate ability to perform overhead lifting of at least 5# using appropriate body mechanics and with no more than minimal pain in order to safely perform normal daily/occupational tasks.   Baseline: unable  Goal status: INITIAL  3.  Patient will demonstrate improved Lt shoulder AROM to at least 150 shoulder flexion/abduction.   Baseline:  Active ROM Left eval  Shoulder flexion 85  Shoulder extension   Shoulder abduction 48  Shoulder adduction   Shoulder internal rotation WNL  Shoulder external rotation 0   Goal status: INITIAL  4.  Patient will demonstrate improved Lt shoulder strength to at least 4/5 MMT in all directions.  Baseline: unable to formally assess  Goal status: INITIAL  5.  Patient will report ability to  performing normal bathing/dressing activities without exacerbation of symptoms.  Baseline: unable Goal status: INITIAL    PLAN:  PT FREQUENCY: 2x/week  PT DURATION: 8 weeks  PLANNED INTERVENTIONS: Therapeutic exercises, Therapeutic activity, Neuromuscular re-education, Self Care, Joint mobilization, Dry Needling, Electrical stimulation, Cryotherapy, Moist heat, Taping, Vasopneumatic device, Ionotophoresis 4mg /ml Dexamethasone, Manual therapy, and Re-evaluation  PLAN FOR NEXT SESSION: mobility only per MD referral; PROM/AAROM, manual therapy, TPDN as indicated, modalities as appropriate, ongoing pt education regarding condition    Fredderick Phenix, PT 04/18/2023, 12:20 PM

## 2023-04-20 ENCOUNTER — Encounter: Payer: Self-pay | Admitting: Physical Therapy

## 2023-04-20 ENCOUNTER — Ambulatory Visit: Payer: 59 | Admitting: Physical Therapy

## 2023-04-20 DIAGNOSIS — M25612 Stiffness of left shoulder, not elsewhere classified: Secondary | ICD-10-CM

## 2023-04-20 NOTE — Therapy (Signed)
OUTPATIENT PHYSICAL THERAPY TREATMENT NOTE   Patient Name: Lisa Day MRN: [id number] DOB:01-31-1976, 47 y.o., female Today's Date: 04/20/2023  END OF SESSION:  PT End of Session - 04/20/23 0916     Visit Number 7    Number of Visits 17    Date for PT Re-Evaluation 05/20/23    Authorization Type U[employer]    PT Start Time 0915    PT Stop Time 0957    PT Time Calculation (min) 42 min    Activity Tolerance Patient tolerated treatment well;Patient limited by pain    Behavior During Therapy [facility] for tasks assessed/performed             Past Medical History:  Diagnosis Date   Allergic rhinitis    Allergy    Anxiety and depression    Asthma    Fibromyalgia    Grave's disease    Headache 07/02/2014   Past Surgical History:  Procedure Laterality Date   APPENDECTOMY  2001   BREAST BIOPSY Right 11/17/2022   [place] RT BREAST BX W LOC DEV 1ST LESION IMG BX SPEC US GUIDE 11/17/2022 [facility]   CESAREAN SECTION  04/20/2011   Procedure: CESAREAN SECTION;  Surgeon: Jeani Hawking, MD;  Location: [facility];  Service: Gynecology;  Laterality: N/A;  Primary Cesarean Section with Delivery Baby Boy @ 0421   CHOLECYSTECTOMY  2006   EVACUATION BREAST HEMATOMA Right 03/13/2023   Procedure: EXCISION CHRONIC RIGHT BREAST ABSCESS;  Surgeon: Abigail Miyamoto, MD;  Location: [facility];  Service: General;  Laterality: Right;  LMA   HIP ARTHROSCOPY     Patient Active Problem List   Diagnosis Date Noted   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   Allergy with anaphylaxis due to food 05/16/2016   Allergic rhinitis due to pollen 05/16/2016   Moderate persistent asthma 05/16/2016   Right hip pain 05/01/2015   Headache 07/02/2014   Routine health maintenance 01/02/2013   S/P cesarean section 04/20/2011   Current pregnancy with history of pre-term labor 03/17/2011    PCP: Camie Patience, FNP  REFERRING PROVIDER: Camie Patience, FNP  REFERRING DIAG: Ralene Cork,  DO  THERAPY DIAG:  Stiffness of left shoulder, not elsewhere classified  Rationale for Evaluation and Treatment: Rehabilitation  ONSET DATE: 2-3 months   SUBJECTIVE:                                                                                                                                                                                      SUBJECTIVE STATEMENT: Pt reports that she feels she is making some progress with PT and would benefit from more visits.  She  rates her pain at 6/10 currently.   Hand dominance: Right  PERTINENT HISTORY: PMHx includes anxiety/depression, asthma, fibromyalgia, Gonce' disease  PAIN:  Are you having pain? Yes: NPRS scale: 6/10 Pain location: Lt shoulder, with some radiating symptoms  Pain description: sharp, pulling, occasional paresthesia Aggravating factors: stretching/reaching, overhead reaching, bathing/dressing  Relieving factors: none identified  PRECAUTIONS: None   WEIGHT BEARING RESTRICTIONS: No  FALLS:  Has patient fallen in last 6 months? No  LIVING ENVIRONMENT: Lives with: lives with their family Lives in: House/apartment Stairs: Yes: Internal: 12 steps; none and External: 5 steps; none Has following equipment at home: None  OCCUPATION: Sedentary, desk job   PLOF: Independent  PATIENT GOALS: To have decreased pain and improved mobility; ability to return to bathing and dressing without pain.  Ability to reach to shelf without increased symptoms  NEXT MD VISIT:   OBJECTIVE:   DIAGNOSTIC FINDINGS:  None performed/available at time of evaluation.  PATIENT SURVEYS:  FOTO 48 current, 65 predicted  COGNITION: Overall cognitive status: Within functional limits for tasks assessed     SENSATION: Not tested  POSTURE: Slightly rounded shoulders, Minimal forward while seated  UPPER EXTREMITY ROM:   Active ROM Left eval Left 04/13/23  Shoulder flexion 85 AAROM: 105  Shoulder extension    Shoulder abduction 48    Shoulder adduction    Shoulder internal rotation WNL   Shoulder external rotation 0   Elbow flexion    Elbow extension    Wrist flexion    Wrist extension    Wrist ulnar deviation    Wrist radial deviation    Wrist pronation    Wrist supination    (Blank rows = not tested)  UPPER EXTREMITY MMT: Not formally assessed at eval due to range of motion deficits  MMT Right eval Left eval  Shoulder flexion    Shoulder extension    Shoulder abduction    Shoulder adduction    Shoulder internal rotation    Shoulder external rotation    Middle trapezius    Lower trapezius    Elbow flexion    Elbow extension    Wrist flexion    Wrist extension    Wrist ulnar deviation    Wrist radial deviation    Wrist pronation    Wrist supination    Grip strength (lbs)    (Blank rows = not tested)  SHOULDER SPECIAL TESTS: None performed at evaluation due to range of motion deficits and current mobility deficits  PALPATION:  Moderate to severe tenderness to palpation throughout the left shoulder and parascapular musculature, with most tenderness with palpation to supraspinatus/infraspinatus insertion.    TODAY'S TREATMENT:  [facility] Adult PT Treatment:                                                DATE: 04/20/23 Therapeutic Exercise: Pulley flexion/scaption x2' each Pball flexion/scaption on table x 10 Finger ladder x 5ea flexion/scaption  Sidelying: ER -> Flexion -> Horizontal abd - 15x ea Shoulder circles - 20x ea direction Seated ER with dowel AAROM 2x10 Supine flexion AAROM with dowel 2x10 standing abduction and flexion AAROM with dowel 2x10 Manual Therapy: PROM all motions as tolerated Lt AP/inf joint mobs grade I-III Lt  Hot pack applied during manual   [facility] Adult PT Treatment:  DATE: 04/06/2023  Therapeutic Exercise: Pulley flexion/scaption x2' each Pball flexion on table x 10 Finger ladder x 5ea flexion/scaption  Seated table  slides flexion with pillow case x 20   Manual Therapy: PROM all motions as tolerated Lt AP/inf joint mobs grade I-III Lt Long axis distraction    [facility] Adult PT Treatment:                                                DATE: 04/05/23 Therapeutic Exercise: Pulley flexion/scaption x2' each Pball flexion Finger ladder Manual Therapy: PROM all motions as tolerated Lt AP/inf joint mobs grade I-III Lt                                                                                                                                    PATIENT EDUCATION: Education details: reviewed initial home exercise program; discussion of POC, prognosis and goals for skilled PT   Person educated: Patient Education method: Explanation, Demonstration, and Handouts Education comprehension: verbalized understanding, returned demonstration, and needs further education  HOME EXERCISE PROGRAM: Access Code: [email or web address] URL: [email or web address] Date: 03/25/2023 Prepared by: Mauri Reading  Exercises - Seated Shoulder Pendulum Exercise  - 2 x daily - 7 x weekly - 3 reps - 30 sec hold - Horizontal Shoulder Pendulum with Table Support  - 2 x daily - 7 x weekly - 3 reps - 30 sec hold - Seated Shoulder Flexion Towel Slide at Table Top Full Range of Motion  - 2 x daily - 7 x weekly - 1-2 sets - 10 reps - 3 sec hold - Standing Single Arm Shoulder Flexion Towel Slide at Table Top  - 2 x daily - 7 x weekly - 1-2 sets - 10 reps - 3 sec hold  Patient Education - Frozen Shoulder  ASSESSMENT:  CLINICAL IMPRESSION: Arkesha is progressing well with therapy.  We continue to work on ROM to good effect.  She shows significant improvement in ROM following manual.  Added in some gentle cuff and periscapular strengthening.   OBJECTIVE IMPAIRMENTS: decreased activity tolerance, decreased mobility, decreased ROM, decreased strength, impaired UE functional use, postural dysfunction, and pain.   ACTIVITY LIMITATIONS:  carrying, lifting, sleeping, bed mobility, bathing, dressing, reach over head, and hygiene/grooming  PARTICIPATION LIMITATIONS: meal prep, cleaning, interpersonal relationship, driving, shopping, and community activity  PERSONAL FACTORS: Time since onset of injury/illness/exacerbation and 1-2 comorbidities: PMHx includes anxiety/depression, asthma, fibromyalgia, Word' disease  are also affecting patient's functional outcome.   REHAB POTENTIAL: Good  CLINICAL DECISION MAKING: Stable/uncomplicated  EVALUATION COMPLEXITY: Low   GOALS: Goals reviewed with patient? Yes  SHORT TERM GOALS: Target date: 04/22/2023   Patient will be independent with initial home program for shoulder AAROM activities.  Baseline: provided at eval  Goal status: MET  2.  Patient  will demonstrate improved postural awareness for at least 15 minutes while seated without need for cueing from PT.   Baseline: see objective measures  Goal status: MET   LONG TERM GOALS: Target date: 05/20/2023   Patient will report improved overall functional ability with FOTO score of 65 or greater.  Baseline: 48  Goal status: INITIAL  2.  Patient will demonstrate ability to perform overhead lifting of at least 5# using appropriate body mechanics and with no more than minimal pain in order to safely perform normal daily/occupational tasks.   Baseline: unable  Goal status: INITIAL  3.  Patient will demonstrate improved Lt shoulder AROM to at least 150 shoulder flexion/abduction.   Baseline:  Active ROM Left eval  Shoulder flexion 85  Shoulder extension   Shoulder abduction 48  Shoulder adduction   Shoulder internal rotation WNL  Shoulder external rotation 0   Goal status: INITIAL  4.  Patient will demonstrate improved Lt shoulder strength to at least 4/5 MMT in all directions.  Baseline: unable to formally assess  Goal status: INITIAL  5.  Patient will report ability to performing normal bathing/dressing activities  without exacerbation of symptoms.  Baseline: unable Goal status: INITIAL    PLAN:  PT FREQUENCY: 2x/week  PT DURATION: 8 weeks  PLANNED INTERVENTIONS: Therapeutic exercises, Therapeutic activity, Neuromuscular re-education, Self Care, Joint mobilization, Dry Needling, Electrical stimulation, Cryotherapy, Moist heat, Taping, Vasopneumatic device, Ionotophoresis 4mg /ml Dexamethasone, Manual therapy, and Re-evaluation  PLAN FOR NEXT SESSION: mobility only per MD referral; PROM/AAROM, manual therapy, TPDN as indicated, modalities as appropriate, ongoing pt education regarding condition    Fredderick Phenix, PT 04/20/2023, 9:58 AM

## 2023-04-27 ENCOUNTER — Other Ambulatory Visit: Payer: Self-pay

## 2023-04-27 ENCOUNTER — Ambulatory Visit: Payer: 59 | Attending: Sports Medicine

## 2023-04-27 DIAGNOSIS — M25512 Pain in left shoulder: Secondary | ICD-10-CM | POA: Insufficient documentation

## 2023-04-27 DIAGNOSIS — G8929 Other chronic pain: Secondary | ICD-10-CM | POA: Diagnosis present

## 2023-04-27 DIAGNOSIS — M25612 Stiffness of left shoulder, not elsewhere classified: Secondary | ICD-10-CM | POA: Insufficient documentation

## 2023-04-27 NOTE — Therapy (Addendum)
OUTPATIENT PHYSICAL THERAPY TREATMENT NOTE   Patient Name: Lisa Day MRN: [id number] DOB:[birth year], 47 y.o., female Today's Date: 05/04/2023 (04/27/2023)  END OF SESSION:  Past Medical History:  Diagnosis Date   Allergic rhinitis    Allergy    Anxiety and depression    Asthma    Fibromyalgia    Grave's disease    Headache 07/02/2014   Past Surgical History:  Procedure Laterality Date   APPENDECTOMY  2001   BREAST BIOPSY Right 11/17/2022   [place] RT BREAST BX W LOC DEV 1ST LESION IMG BX SPEC US GUIDE 11/17/2022 [facility]   CESAREAN SECTION  04/20/2011   Procedure: CESAREAN SECTION;  Surgeon: Jeani Hawking, MD;  Location: [facility];  Service: Gynecology;  Laterality: N/A;  Primary Cesarean Section with Delivery Baby Boy @ 0421   CHOLECYSTECTOMY  2006   EVACUATION BREAST HEMATOMA Right 03/13/2023   Procedure: EXCISION CHRONIC RIGHT BREAST ABSCESS;  Surgeon: Abigail Miyamoto, MD;  Location: [facility];  Service: General;  Laterality: Right;  LMA   HIP ARTHROSCOPY     Patient Active Problem List   Diagnosis Date Noted   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   Allergy with anaphylaxis due to food 05/16/2016   Allergic rhinitis due to pollen 05/16/2016   Moderate persistent asthma 05/16/2016   Right hip pain 05/01/2015   Headache 07/02/2014   Routine health maintenance 01/02/2013   S/P cesarean section 04/20/2011   Current pregnancy with history of pre-term labor 03/17/2011    PCP: Camie Patience, FNP  REFERRING PROVIDER: Camie Patience, FNP  REFERRING DIAG: Ralene Cork, DO  THERAPY DIAG:  Stiffness of left shoulder, not elsewhere classified  Chronic left shoulder pain  Rationale for Evaluation and Treatment: Rehabilitation  ONSET DATE: 2-3 months   SUBJECTIVE:                                                                                                                                                                                       SUBJECTIVE STATEMENT: Patient returns to the clinic with similar reports - L shoulder stiffness and soreness persists, but gradually improving. She thinks things are heading in the right direction and will get better with treatment and time.   Hand dominance: Right  PERTINENT HISTORY: PMHx includes anxiety/depression, asthma, fibromyalgia, Cocker' disease  PAIN:  Are you having pain? Yes: NPRS scale: 6/10 Pain location: Lt shoulder, with some radiating symptoms  Pain description: sharp, pulling, occasional paresthesia Aggravating factors: stretching/reaching, overhead reaching, bathing/dressing  Relieving factors: none identified  PRECAUTIONS: None   WEIGHT BEARING RESTRICTIONS: No  FALLS:  Has patient fallen in last 6 months?  No  LIVING ENVIRONMENT: Lives with: lives with their family Lives in: House/apartment Stairs: Yes: Internal: 12 steps; none and External: 5 steps; none Has following equipment at home: None  OCCUPATION: Sedentary, desk job   PLOF: Independent  PATIENT GOALS: To have decreased pain and improved mobility; ability to return to bathing and dressing without pain.  Ability to reach to shelf without increased symptoms  NEXT MD VISIT:   OBJECTIVE:   DIAGNOSTIC FINDINGS:  None performed/available at time of evaluation.  PATIENT SURVEYS:  FOTO 48 current, 65 predicted  COGNITION: Overall cognitive status: Within functional limits for tasks assessed     SENSATION: Not tested  POSTURE: Slightly rounded shoulders, Minimal forward while seated  UPPER EXTREMITY ROM:   Active ROM Left eval Left 04/13/23  Shoulder flexion 85 AAROM: 105  Shoulder extension    Shoulder abduction 48   Shoulder adduction    Shoulder internal rotation WNL   Shoulder external rotation 0   Elbow flexion    Elbow extension    Wrist flexion    Wrist extension    Wrist ulnar deviation    Wrist radial deviation    Wrist pronation    Wrist supination     (Blank rows = not tested)  UPPER EXTREMITY MMT: Not formally assessed at eval due to range of motion deficits  MMT Right eval Left eval  Shoulder flexion    Shoulder extension    Shoulder abduction    Shoulder adduction    Shoulder internal rotation    Shoulder external rotation    Middle trapezius    Lower trapezius    Elbow flexion    Elbow extension    Wrist flexion    Wrist extension    Wrist ulnar deviation    Wrist radial deviation    Wrist pronation    Wrist supination    Grip strength (lbs)    (Blank rows = not tested)  SHOULDER SPECIAL TESTS: None performed at evaluation due to range of motion deficits and current mobility deficits  PALPATION:  Moderate to severe tenderness to palpation throughout the left shoulder and parascapular musculature, with most tenderness with palpation to supraspinatus/infraspinatus insertion.    TODAY'S TREATMENT:  [facility] Adult PT Treatment:                                                DATE: 04/27/23 Therapeutic Exercise: Supine: PROM L shoulder: flexion and external rotation Manual Therapy: Supine:  L shoulder mobilizations - AP, lateral, inferior  L rib mobilizations: PA glides R2-R6, inferior glides R1 Soft tissue mobilization: pin and stretch to L axillary/lateral scapular musculature into L shoulder flexion Soft tissue mobilizatoin: L upper trapezius and L levator scapulae L cervical mobilizations: unilateral PA glides to C5-C7  [facility] Adult PT Treatment:                                                DATE: 04/20/23 Therapeutic Exercise: Pulley flexion/scaption x2' each Pball flexion/scaption on table x 10 Finger ladder x 5ea flexion/scaption  Sidelying: ER -> Flexion -> Horizontal abd - 15x ea Shoulder circles - 20x ea direction Seated ER with dowel AAROM 2x10 Supine flexion AAROM with dowel 2x10 standing abduction and  flexion AAROM with dowel 2x10 Manual Therapy: PROM all motions as tolerated Lt AP/inf joint mobs  grade I-III Lt  Hot pack applied during manual   [facility] Adult PT Treatment:                                                DATE: 04/06/2023  Therapeutic Exercise: Pulley flexion/scaption x2' each Pball flexion on table x 10 Finger ladder x 5ea flexion/scaption  Seated table slides flexion with pillow case x 20   Manual Therapy: PROM all motions as tolerated Lt AP/inf joint mobs grade I-III Lt Long axis distraction    [facility] Adult PT Treatment:                                                DATE: 04/05/23 Therapeutic Exercise: Pulley flexion/scaption x2' each Pball flexion Finger ladder Manual Therapy: PROM all motions as tolerated Lt AP/inf joint mobs grade I-III Lt                                                                                                                                    PATIENT EDUCATION: Education details: reviewed initial home exercise program; discussion of POC, prognosis and goals for skilled PT   Person educated: Patient Education method: Explanation, Demonstration, and Handouts Education comprehension: verbalized understanding, returned demonstration, and needs further education  HOME EXERCISE PROGRAM: Access Code: [email or web address] URL: [email or web address] Date: 03/25/2023 Prepared by: Mauri Reading  Exercises - Seated Shoulder Pendulum Exercise  - 2 x daily - 7 x weekly - 3 reps - 30 sec hold - Horizontal Shoulder Pendulum with Table Support  - 2 x daily - 7 x weekly - 3 reps - 30 sec hold - Seated Shoulder Flexion Towel Slide at Table Top Full Range of Motion  - 2 x daily - 7 x weekly - 1-2 sets - 10 reps - 3 sec hold - Standing Single Arm Shoulder Flexion Towel Slide at Table Top  - 2 x daily - 7 x weekly - 1-2 sets - 10 reps - 3 sec hold  Patient Education - Frozen Shoulder  ASSESSMENT:  CLINICAL IMPRESSION: Interventions were helpful in improving L shoulder ROM in session, primarily focused on L shoulder flexion and external  rotation. L biceps reached near L ear level with flexion by end of session. Included treatment to the L rib cage and L cervical spine today - treatment of the L rib cage appeared to have a greater impact on L shoulder ROM though areas of hyperalgesia and stiffness present at L lower cervical spine. Physical therapy remains indicated.  OBJECTIVE IMPAIRMENTS: decreased activity tolerance, decreased mobility, decreased ROM,  decreased strength, impaired UE functional use, postural dysfunction, and pain.   ACTIVITY LIMITATIONS: carrying, lifting, sleeping, bed mobility, bathing, dressing, reach over head, and hygiene/grooming  PARTICIPATION LIMITATIONS: meal prep, cleaning, interpersonal relationship, driving, shopping, and community activity  PERSONAL FACTORS: Time since onset of injury/illness/exacerbation and 1-2 comorbidities: PMHx includes anxiety/depression, asthma, fibromyalgia, Davidian' disease  are also affecting patient's functional outcome.   REHAB POTENTIAL: Good  CLINICAL DECISION MAKING: Stable/uncomplicated  EVALUATION COMPLEXITY: Low   GOALS: Goals reviewed with patient? Yes  SHORT TERM GOALS: Target date: 04/22/2023   Patient will be independent with initial home program for shoulder AAROM activities.  Baseline: provided at eval  Goal status: MET  2.  Patient will demonstrate improved postural awareness for at least 15 minutes while seated without need for cueing from PT.   Baseline: see objective measures  Goal status: MET   LONG TERM GOALS: Target date: 05/20/2023   Patient will report improved overall functional ability with FOTO score of 65 or greater.  Baseline: 48  Goal status: INITIAL  2.  Patient will demonstrate ability to perform overhead lifting of at least 5# using appropriate body mechanics and with no more than minimal pain in order to safely perform normal daily/occupational tasks.   Baseline: unable  Goal status: INITIAL  3.  Patient will demonstrate  improved Lt shoulder AROM to at least 150 shoulder flexion/abduction.   Baseline:  Active ROM Left eval  Shoulder flexion 85  Shoulder extension   Shoulder abduction 48  Shoulder adduction   Shoulder internal rotation WNL  Shoulder external rotation 0   Goal status: INITIAL  4.  Patient will demonstrate improved Lt shoulder strength to at least 4/5 MMT in all directions.  Baseline: unable to formally assess  Goal status: INITIAL  5.  Patient will report ability to performing normal bathing/dressing activities without exacerbation of symptoms.  Baseline: unable Goal status: INITIAL    PLAN:  PT FREQUENCY: 2x/week  PT DURATION: 8 weeks  PLANNED INTERVENTIONS: Therapeutic exercises, Therapeutic activity, Neuromuscular re-education, Self Care, Joint mobilization, Dry Needling, Electrical stimulation, Cryotherapy, Moist heat, Taping, Vasopneumatic device, Ionotophoresis 4mg /ml Dexamethasone, Manual therapy, and Re-evaluation  PLAN FOR NEXT SESSION: mobility only per MD referral; PROM/AAROM, manual therapy, TPDN as indicated, modalities as appropriate, ongoing pt education regarding condition    Clelia Schaumann, PT 05/04/2023, 11:08 AM

## 2023-05-02 ENCOUNTER — Ambulatory Visit: Payer: 59 | Admitting: Physical Therapy

## 2023-05-04 ENCOUNTER — Other Ambulatory Visit: Payer: Self-pay

## 2023-05-04 ENCOUNTER — Ambulatory Visit: Payer: 59

## 2023-05-04 DIAGNOSIS — M25612 Stiffness of left shoulder, not elsewhere classified: Secondary | ICD-10-CM

## 2023-05-04 DIAGNOSIS — G8929 Other chronic pain: Secondary | ICD-10-CM

## 2023-05-04 NOTE — Therapy (Signed)
OUTPATIENT PHYSICAL THERAPY TREATMENT NOTE   Patient Name: Lisa Day MRN: [id number] DOB:1976-01-08, 47 y.o., female Today's Date: 05/04/2023  END OF SESSION:  PT End of Session - 05/04/23 1012     Visit Number 9    Number of Visits 17    Date for PT Re-Evaluation 05/20/23    Authorization Type [employer]    PT Start Time 0932    PT Stop Time 1010    PT Time Calculation (min) 38 min    Activity Tolerance Patient tolerated treatment well    Behavior During Therapy [facility] for tasks assessed/performed              Past Medical History:  Diagnosis Date   Allergic rhinitis    Allergy    Anxiety and depression    Asthma    Fibromyalgia    Grave's disease    Headache 07/02/2014   Past Surgical History:  Procedure Laterality Date   APPENDECTOMY  2001   BREAST BIOPSY Right 11/17/2022   [place] RT BREAST BX W LOC DEV 1ST LESION IMG BX SPEC US GUIDE 11/17/2022 [facility]   CESAREAN SECTION  04/20/2011   Procedure: CESAREAN SECTION;  Surgeon: Jeani Hawking, MD;  Location: [facility];  Service: Gynecology;  Laterality: N/A;  Primary Cesarean Section with Delivery Baby Boy @ 0421   CHOLECYSTECTOMY  2006   EVACUATION BREAST HEMATOMA Right 03/13/2023   Procedure: EXCISION CHRONIC RIGHT BREAST ABSCESS;  Surgeon: Abigail Miyamoto, MD;  Location: [facility];  Service: General;  Laterality: Right;  LMA   HIP ARTHROSCOPY     Patient Active Problem List   Diagnosis Date Noted   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   Allergy with anaphylaxis due to food 05/16/2016   Allergic rhinitis due to pollen 05/16/2016   Moderate persistent asthma 05/16/2016   Right hip pain 05/01/2015   Headache 07/02/2014   Routine health maintenance 01/02/2013   S/P cesarean section 04/20/2011   Current pregnancy with history of pre-term labor 03/17/2011    PCP: Camie Patience, FNP  REFERRING PROVIDER: Camie Patience, FNP  REFERRING DIAG: Ralene Cork, DO  THERAPY DIAG:   Stiffness of left shoulder, not elsewhere classified  Chronic left shoulder pain  Rationale for Evaluation and Treatment: Rehabilitation  ONSET DATE: 2-3 months   SUBJECTIVE:                                                                                                                                                                                      SUBJECTIVE STATEMENT: Patient returns to the clinic describing no significant changes in status since last visit. She had increased  L shoulder soreness for 2-3 days after last session but this has generally been the case after each physical therapy session and the increased soreness is tolerable. The patient also had a session of acupuncture and cupping for her L shoulder since last session.   Hand dominance: Right  PERTINENT HISTORY: PMHx includes anxiety/depression, asthma, fibromyalgia, Sluka' disease  PAIN:  Are you having pain? Yes: NPRS scale: 6/10 Pain location: Lt shoulder, with some radiating symptoms  Pain description: sharp, pulling, occasional paresthesia Aggravating factors: stretching/reaching, overhead reaching, bathing/dressing  Relieving factors: none identified  PRECAUTIONS: None   WEIGHT BEARING RESTRICTIONS: No  FALLS:  Has patient fallen in last 6 months? No  LIVING ENVIRONMENT: Lives with: lives with their family Lives in: House/apartment Stairs: Yes: Internal: 12 steps; none and External: 5 steps; none Has following equipment at home: None  OCCUPATION: Sedentary, desk job   PLOF: Independent  PATIENT GOALS: To have decreased pain and improved mobility; ability to return to bathing and dressing without pain.  Ability to reach to shelf without increased symptoms  NEXT MD VISIT:   OBJECTIVE:   DIAGNOSTIC FINDINGS:  None performed/available at time of evaluation.  PATIENT SURVEYS:  FOTO 48 current, 65 predicted  COGNITION: Overall cognitive status: Within functional limits for tasks  assessed     SENSATION: Not tested  POSTURE: Slightly rounded shoulders, Minimal forward while seated  UPPER EXTREMITY ROM:   Active ROM Left eval Left 04/13/23  Shoulder flexion 85 AAROM: 105  Shoulder extension    Shoulder abduction 48   Shoulder adduction    Shoulder internal rotation WNL   Shoulder external rotation 0   Elbow flexion    Elbow extension    Wrist flexion    Wrist extension    Wrist ulnar deviation    Wrist radial deviation    Wrist pronation    Wrist supination    (Blank rows = not tested)  UPPER EXTREMITY MMT: Not formally assessed at eval due to range of motion deficits  MMT Right eval Left eval  Shoulder flexion    Shoulder extension    Shoulder abduction    Shoulder adduction    Shoulder internal rotation    Shoulder external rotation    Middle trapezius    Lower trapezius    Elbow flexion    Elbow extension    Wrist flexion    Wrist extension    Wrist ulnar deviation    Wrist radial deviation    Wrist pronation    Wrist supination    Grip strength (lbs)    (Blank rows = not tested)  SHOULDER SPECIAL TESTS: None performed at evaluation due to range of motion deficits and current mobility deficits  PALPATION:  Moderate to severe tenderness to palpation throughout the left shoulder and parascapular musculature, with most tenderness with palpation to supraspinatus/infraspinatus insertion.    TODAY'S TREATMENT:  [facility] Adult PT Treatment:                                                DATE: 05/04/23 Therapeutic Exercise: Supine: PROM L shoulder: flexion and abduction Manual Therapy: Supine:  L shoulder mobilizations - AP, lateral, inferior glides L scapular mobilizations - lateral glides L rib mobilizations: PA glides R2-R6 Soft tissue mobilization: pin and stretch to L axillary/lateral scapular musculature into L shoulder flexion  [facility] Adult PT  Treatment:                                                DATE:  04/27/23 Therapeutic Exercise: Supine: PROM L shoulder: flexion and external rotation Manual Therapy: Supine:  L shoulder mobilizations - AP, lateral, inferior  L rib mobilizations: PA glides R2-R6, inferior glides R1 Soft tissue mobilization: pin and stretch to L axillary/lateral scapular musculature into L shoulder flexion Soft tissue mobilizatoin: L upper trapezius and L levator scapulae L cervical mobilizations: unilateral PA glides to C5-C7  [facility] Adult PT Treatment:                                                DATE: 04/20/23 Therapeutic Exercise: Pulley flexion/scaption x2' each Pball flexion/scaption on table x 10 Finger ladder x 5ea flexion/scaption  Sidelying: ER -> Flexion -> Horizontal abd - 15x ea Shoulder circles - 20x ea direction Seated ER with dowel AAROM 2x10 Supine flexion AAROM with dowel 2x10 standing abduction and flexion AAROM with dowel 2x10 Manual Therapy: PROM all motions as tolerated Lt AP/inf joint mobs grade I-III Lt  Hot pack applied during manual   [facility] Adult PT Treatment:                                                DATE: 04/06/2023  Therapeutic Exercise: Pulley flexion/scaption x2' each Pball flexion on table x 10 Finger ladder x 5ea flexion/scaption  Seated table slides flexion with pillow case x 20   Manual Therapy: PROM all motions as tolerated Lt AP/inf joint mobs grade I-III Lt Long axis distraction    [facility] Adult PT Treatment:                                                DATE: 04/05/23 Therapeutic Exercise: Pulley flexion/scaption x2' each Pball flexion Finger ladder Manual Therapy: PROM all motions as tolerated Lt AP/inf joint mobs grade I-III Lt                                                                                                                                    PATIENT EDUCATION: Education details: reviewed initial home exercise program; discussion of POC, prognosis and goals for skilled PT   Person  educated: Patient Education method: Explanation, Demonstration, and Handouts Education comprehension: verbalized understanding, returned demonstration, and needs further education  HOME EXERCISE PROGRAM:  Access Code: [email or web address] URL: [email or web address] Date: 03/25/2023 Prepared by: Mauri Reading  Exercises - Seated Shoulder Pendulum Exercise  - 2 x daily - 7 x weekly - 3 reps - 30 sec hold - Horizontal Shoulder Pendulum with Table Support  - 2 x daily - 7 x weekly - 3 reps - 30 sec hold - Seated Shoulder Flexion Towel Slide at Table Top Full Range of Motion  - 2 x daily - 7 x weekly - 1-2 sets - 10 reps - 3 sec hold - Standing Single Arm Shoulder Flexion Towel Slide at Table Top  - 2 x daily - 7 x weekly - 1-2 sets - 10 reps - 3 sec hold  Patient Education - Frozen Shoulder  ASSESSMENT:  CLINICAL IMPRESSION: L shoulder ROM continues to improve during sessions with treatments. Pain at end range flexion is felt at the top of the L shoulder and some stretching into the L axillary region. L shoulder ROM remains restricted compared to L - physical therapy remains indicated.  OBJECTIVE IMPAIRMENTS: decreased activity tolerance, decreased mobility, decreased ROM, decreased strength, impaired UE functional use, postural dysfunction, and pain.   ACTIVITY LIMITATIONS: carrying, lifting, sleeping, bed mobility, bathing, dressing, reach over head, and hygiene/grooming  PARTICIPATION LIMITATIONS: meal prep, cleaning, interpersonal relationship, driving, shopping, and community activity  PERSONAL FACTORS: Time since onset of injury/illness/exacerbation and 1-2 comorbidities: PMHx includes anxiety/depression, asthma, fibromyalgia, Vessels' disease  are also affecting patient's functional outcome.   REHAB POTENTIAL: Good  CLINICAL DECISION MAKING: Stable/uncomplicated  EVALUATION COMPLEXITY: Low   GOALS: Goals reviewed with patient? Yes  SHORT TERM GOALS: Target date:  04/22/2023   Patient will be independent with initial home program for shoulder AAROM activities.  Baseline: provided at eval  Goal status: MET  2.  Patient will demonstrate improved postural awareness for at least 15 minutes while seated without need for cueing from PT.   Baseline: see objective measures  Goal status: MET   LONG TERM GOALS: Target date: 05/20/2023   Patient will report improved overall functional ability with FOTO score of 65 or greater.  Baseline: 48  Goal status: INITIAL  2.  Patient will demonstrate ability to perform overhead lifting of at least 5# using appropriate body mechanics and with no more than minimal pain in order to safely perform normal daily/occupational tasks.   Baseline: unable  Goal status: INITIAL  3.  Patient will demonstrate improved Lt shoulder AROM to at least 150 shoulder flexion/abduction.   Baseline:  Active ROM Left eval  Shoulder flexion 85  Shoulder extension   Shoulder abduction 48  Shoulder adduction   Shoulder internal rotation WNL  Shoulder external rotation 0   Goal status: INITIAL  4.  Patient will demonstrate improved Lt shoulder strength to at least 4/5 MMT in all directions.  Baseline: unable to formally assess  Goal status: INITIAL  5.  Patient will report ability to performing normal bathing/dressing activities without exacerbation of symptoms.  Baseline: unable Goal status: INITIAL PLAN:  PT FREQUENCY: 2x/week  PT DURATION: 8 weeks  PLANNED INTERVENTIONS: Therapeutic exercises, Therapeutic activity, Neuromuscular re-education, Self Care, Joint mobilization, Dry Needling, Electrical stimulation, Cryotherapy, Moist heat, Taping, Vasopneumatic device, Ionotophoresis 4mg /ml Dexamethasone, Manual therapy, and Re-evaluation  PLAN FOR NEXT SESSION: mobility only per MD referral; PROM/AAROM, manual therapy, TPDN as indicated, modalities as appropriate, ongoing pt education regarding condition   Clelia Schaumann,  PT 05/04/2023, 11:05 AM

## 2023-05-08 NOTE — Therapy (Signed)
OUTPATIENT PHYSICAL THERAPY TREATMENT NOTE   Patient Name: Lisa Day MRN: [id number] DOB:[birth year], 47 y.o., female Today's Date: 05/09/2023  END OF SESSION:  PT End of Session - 05/09/23 0845     Visit Number 10    Number of Visits 17    Date for PT Re-Evaluation 05/20/23    Authorization Type [employer]    PT Start Time 0846    PT Stop Time 0926    PT Time Calculation (min) 40 min    Activity Tolerance Patient tolerated treatment well    Behavior During Therapy [facility] for tasks assessed/performed               Past Medical History:  Diagnosis Date   Allergic rhinitis    Allergy    Anxiety and depression    Asthma    Fibromyalgia    Grave's disease    Headache 07/02/2014   Past Surgical History:  Procedure Laterality Date   APPENDECTOMY  2001   BREAST BIOPSY Right 11/17/2022   [place] RT BREAST BX W LOC DEV 1ST LESION IMG BX SPEC US GUIDE 11/17/2022 [facility]   CESAREAN SECTION  04/20/2011   Procedure: CESAREAN SECTION;  Surgeon: Jeani Hawking, MD;  Location: [facility];  Service: Gynecology;  Laterality: N/A;  Primary Cesarean Section with Delivery Baby Boy @ 0421   CHOLECYSTECTOMY  2006   EVACUATION BREAST HEMATOMA Right 03/13/2023   Procedure: EXCISION CHRONIC RIGHT BREAST ABSCESS;  Surgeon: Abigail Miyamoto, MD;  Location: [facility];  Service: General;  Laterality: Right;  LMA   HIP ARTHROSCOPY     Patient Active Problem List   Diagnosis Date Noted   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   Allergy with anaphylaxis due to food 05/16/2016   Allergic rhinitis due to pollen 05/16/2016   Moderate persistent asthma 05/16/2016   Right hip pain 05/01/2015   Headache 07/02/2014   Routine health maintenance 01/02/2013   S/P cesarean section 04/20/2011   Current pregnancy with history of pre-term labor 03/17/2011    PCP: Camie Patience, FNP  REFERRING PROVIDER: Camie Patience, FNP  REFERRING DIAG: Ralene Cork, DO  THERAPY DIAG:   Stiffness of left shoulder, not elsewhere classified  Chronic left shoulder pain  Rationale for Evaluation and Treatment: Rehabilitation  ONSET DATE: 2-3 months   SUBJECTIVE:                                                                                                                                                                                      SUBJECTIVE STATEMENT: 05/09/2023 Pt states she pretty sore for a day or two after last session but got  a bit of relief once it cleared. Notes that her shoulder seems to be moving better than when she first started PT. 6/10 this morning. Doing HEP 2-3x/day   Hand dominance: Right  PERTINENT HISTORY: PMHx includes anxiety/depression, asthma, fibromyalgia, Haak' disease  PAIN:  Are you having pain? Yes: NPRS scale: 6/10   Pain location: Lt shoulder, with some radiating symptoms  Pain description: sharp, pulling, occasional paresthesia Aggravating factors: stretching/reaching, overhead reaching, bathing/dressing  Relieving factors: none identified  PRECAUTIONS: None   WEIGHT BEARING RESTRICTIONS: No  FALLS:  Has patient fallen in last 6 months? No  LIVING ENVIRONMENT: Lives with: lives with their family Lives in: House/apartment Stairs: Yes: Internal: 12 steps; none and External: 5 steps; none Has following equipment at home: None  OCCUPATION: Sedentary, desk job   PLOF: Independent  PATIENT GOALS: To have decreased pain and improved mobility; ability to return to bathing and dressing without pain.  Ability to reach to shelf without increased symptoms  NEXT MD VISIT: TBD  OBJECTIVE: (objective measures completed at initial evaluation unless otherwise dated)   DIAGNOSTIC FINDINGS:  None performed/available at time of evaluation.  PATIENT SURVEYS:  FOTO 48 current, 65 predicted 05/09/23 FOTO 52   COGNITION: Overall cognitive status: Within functional limits for tasks assessed     SENSATION: Not  tested  POSTURE: Slightly rounded shoulders, Minimal forward while seated  UPPER EXTREMITY ROM:   Active ROM Left eval Left 04/13/23 L 05/09/23  Shoulder flexion 85 AAROM: 105 130 deg  Shoulder extension     Shoulder abduction 48  98 deg   Shoulder adduction     Shoulder internal rotation WNL    Shoulder external rotation 0    Elbow flexion     Elbow extension     Wrist flexion     Wrist extension     Wrist ulnar deviation     Wrist radial deviation     Wrist pronation     Wrist supination     (Blank rows = not tested)  UPPER EXTREMITY MMT: Not formally assessed at eval due to range of motion deficits  MMT Right eval Left eval  Shoulder flexion    Shoulder extension    Shoulder abduction    Shoulder adduction    Shoulder internal rotation    Shoulder external rotation    Middle trapezius    Lower trapezius    Elbow flexion    Elbow extension    Wrist flexion    Wrist extension    Wrist ulnar deviation    Wrist radial deviation    Wrist pronation    Wrist supination    Grip strength (lbs)    (Blank rows = not tested)  SHOULDER SPECIAL TESTS: None performed at evaluation due to range of motion deficits and current mobility deficits  PALPATION:  Moderate to severe tenderness to palpation throughout the left shoulder and parascapular musculature, with most tenderness with palpation to supraspinatus/infraspinatus insertion.    TODAY'S TREATMENT:  [facility] Adult PT Treatment:                                                DATE: 05/09/23 Therapeutic Exercise: Supine shoulder flexion AAROM 2x10 cues for comfortable ROM Supine chest press dowel 2x10 cues for pacing  Seated prayer stretch 2 bouts of 3x15sec cues for breath control and setup Seated  scaption AAROM w/ dowel x8 cues for hand positioning  Seated ER AAROM w/ dowel x8 cues for elbow positioning HEP update + education/handout   Manual Therapy: Supine: GH passive physiological movement in all planes to pt  tolerance w/ gentle oscillations to reduce muscle guarding Passive pin and stretch lat into abduction and flexion Passive pin and stretch subscap into abduction STM deltoid/infraspinatus  Therapeutic Activity: FOTO + education ROM measurements + education    [facility] Adult PT Treatment:                                                DATE: 05/04/23 Therapeutic Exercise: Supine: PROM L shoulder: flexion and abduction Manual Therapy: Supine:  L shoulder mobilizations - AP, lateral, inferior glides L scapular mobilizations - lateral glides L rib mobilizations: PA glides R2-R6 Soft tissue mobilization: pin and stretch to L axillary/lateral scapular musculature into L shoulder flexion  [facility] Adult PT Treatment:                                                DATE: 04/27/23 Therapeutic Exercise: Supine: PROM L shoulder: flexion and external rotation Manual Therapy: Supine:  L shoulder mobilizations - AP, lateral, inferior  L rib mobilizations: PA glides R2-R6, inferior glides R1 Soft tissue mobilization: pin and stretch to L axillary/lateral scapular musculature into L shoulder flexion Soft tissue mobilizatoin: L upper trapezius and L levator scapulae L cervical mobilizations: unilateral PA glides to C5-C7  [facility] Adult PT Treatment:                                                DATE: 04/20/23 Therapeutic Exercise: Pulley flexion/scaption x2' each Pball flexion/scaption on table x 10 Finger ladder x 5ea flexion/scaption  Sidelying: ER -> Flexion -> Horizontal abd - 15x ea Shoulder circles - 20x ea direction Seated ER with dowel AAROM 2x10 Supine flexion AAROM with dowel 2x10 standing abduction and flexion AAROM with dowel 2x10 Manual Therapy: PROM all motions as tolerated Lt AP/inf joint mobs grade I-III Lt  Hot pack applied during manual   [facility] Adult PT Treatment:                                                DATE: 04/06/2023  Therapeutic Exercise: Pulley flexion/scaption x2'  each Pball flexion on table x 10 Finger ladder x 5ea flexion/scaption  Seated table slides flexion with pillow case x 20   Manual Therapy: PROM all motions as tolerated Lt AP/inf joint mobs grade I-III Lt Long axis distraction    [facility] Adult PT Treatment:                                                DATE: 04/05/23 Therapeutic Exercise: Pulley flexion/scaption x2' each Pball flexion Finger ladder Manual Therapy: PROM all motions as  tolerated Lt AP/inf joint mobs grade I-III Lt                                                                                                                                    PATIENT EDUCATION: Education details: rationale for interventions Person educated: Patient Education method: Explanation, Demonstration, and Handouts Education comprehension: verbalized understanding, returned demonstration, and needs further education  HOME EXERCISE PROGRAM: Access Code: [email or web address] URL: [email or web address] Date: 05/09/2023 Prepared by: Fransisco Hertz  Exercises - Standing Single Arm Shoulder Flexion Towel Slide at Table Top  - 2 x daily - 7 x weekly - 1-2 sets - 10 reps - 3 sec hold - Standing Shoulder External Rotation AAROM with Dowel  - 2 x daily - 7 x weekly - 1-2 sets - 10 reps - 2-3 sec hold - Supine Shoulder Flexion with Dowel  - 2 x daily - 7 x weekly - 1-2 sets - 10 reps  ASSESSMENT:  CLINICAL IMPRESSION: 05/09/2023 Pt arrives w/ 6/10 pain - endorses soreness for a couple days after last session but does state that manual seems helpful in improving mobility. Today is initiated with manual to improve tissue extensibility, continued tenderness is noted about axillary musculature. Pt tolerates subsequent exercises well without any increase in resting pain, continues to endorse transient pain at end ranges of flexion, abduction, and ER. ROM and FOTO measurements both improving, see above. No adverse events, pt departs without overt change  in symptoms. Recommend continuing along current POC in order to address relevant deficits and improve functional tolerance. Pt departs today's session in no acute distress, all voiced questions/concerns addressed appropriately from PT perspective.     OBJECTIVE IMPAIRMENTS: decreased activity tolerance, decreased mobility, decreased ROM, decreased strength, impaired UE functional use, postural dysfunction, and pain.   ACTIVITY LIMITATIONS: carrying, lifting, sleeping, bed mobility, bathing, dressing, reach over head, and hygiene/grooming  PARTICIPATION LIMITATIONS: meal prep, cleaning, interpersonal relationship, driving, shopping, and community activity  PERSONAL FACTORS: Time since onset of injury/illness/exacerbation and 1-2 comorbidities: PMHx includes anxiety/depression, asthma, fibromyalgia, [place]' disease  are also affecting patient's functional outcome.   REHAB POTENTIAL: Good  CLINICAL DECISION MAKING: Stable/uncomplicated  EVALUATION COMPLEXITY: Low   GOALS: Goals reviewed with patient? Yes  SHORT TERM GOALS: Target date: 04/22/2023   Patient will be independent with initial home program for shoulder AAROM activities.  Baseline: provided at eval  Goal status: MET  2.  Patient will demonstrate improved postural awareness for at least 15 minutes while seated without need for cueing from PT.   Baseline: see objective measures  Goal status: MET   LONG TERM GOALS: Target date: 05/20/2023   Patient will report improved overall functional ability with FOTO score of 65 or greater.  Baseline: 48  Goal status: INITIAL  2.  Patient will demonstrate ability to perform overhead lifting of at least 5# using appropriate body mechanics and with no more than minimal pain in  order to safely perform normal daily/occupational tasks.   Baseline: unable  Goal status: INITIAL  3.  Patient will demonstrate improved Lt shoulder AROM to at least 150 shoulder flexion/abduction.   Baseline:   Active ROM Left eval  Shoulder flexion 85  Shoulder extension   Shoulder abduction 48  Shoulder adduction   Shoulder internal rotation WNL  Shoulder external rotation 0   Goal status: INITIAL  4.  Patient will demonstrate improved Lt shoulder strength to at least 4/5 MMT in all directions.  Baseline: unable to formally assess  Goal status: INITIAL  5.  Patient will report ability to performing normal bathing/dressing activities without exacerbation of symptoms.  Baseline: unable Goal status: INITIAL PLAN:  PT FREQUENCY: 2x/week  PT DURATION: 8 weeks  PLANNED INTERVENTIONS: Therapeutic exercises, Therapeutic activity, Neuromuscular re-education, Self Care, Joint mobilization, Dry Needling, Electrical stimulation, Cryotherapy, Moist heat, Taping, Vasopneumatic device, Ionotophoresis 4mg /ml Dexamethasone, Manual therapy, and Re-evaluation  PLAN FOR NEXT SESSION: mobility only per MD referral; PROM/AAROM, manual therapy, TPDN as indicated, modalities as appropriate, ongoing pt education regarding condition   Ashley Murrain PT, DPT 05/09/2023 9:41 AM

## 2023-05-09 ENCOUNTER — Ambulatory Visit: Payer: 59 | Admitting: Physical Therapy

## 2023-05-09 ENCOUNTER — Encounter: Payer: Self-pay | Admitting: Physical Therapy

## 2023-05-09 DIAGNOSIS — M25612 Stiffness of left shoulder, not elsewhere classified: Secondary | ICD-10-CM | POA: Diagnosis not present

## 2023-05-09 DIAGNOSIS — G8929 Other chronic pain: Secondary | ICD-10-CM

## 2023-05-11 ENCOUNTER — Other Ambulatory Visit: Payer: Self-pay

## 2023-05-11 ENCOUNTER — Ambulatory Visit: Payer: 59

## 2023-05-11 DIAGNOSIS — M25612 Stiffness of left shoulder, not elsewhere classified: Secondary | ICD-10-CM

## 2023-05-11 DIAGNOSIS — G8929 Other chronic pain: Secondary | ICD-10-CM

## 2023-05-11 NOTE — Therapy (Signed)
OUTPATIENT PHYSICAL THERAPY TREATMENT NOTE   Patient Name: Lisa Day MRN: [id number] DOB:Mar 03, 1976, 47 y.o., female Today's Date: 05/11/2023  END OF SESSION:  PT End of Session - 05/11/23 1011     Visit Number 11    Number of Visits 17    Date for PT Re-Evaluation 05/20/23    Authorization Type [employer]    PT Start Time 0932    PT Stop Time 1010    PT Time Calculation (min) 38 min    Activity Tolerance Patient tolerated treatment well    Behavior During Therapy [facility] for tasks assessed/performed                Past Medical History:  Diagnosis Date   Allergic rhinitis    Allergy    Anxiety and depression    Asthma    Fibromyalgia    Grave's disease    Headache 07/02/2014   Past Surgical History:  Procedure Laterality Date   APPENDECTOMY  2001   BREAST BIOPSY Right 11/17/2022   [place] RT BREAST BX W LOC DEV 1ST LESION IMG BX SPEC US GUIDE 11/17/2022 [facility]   CESAREAN SECTION  04/20/2011   Procedure: CESAREAN SECTION;  Surgeon: Jeani Hawking, MD;  Location: [facility];  Service: Gynecology;  Laterality: N/A;  Primary Cesarean Section with Delivery Baby Boy @ 0421   CHOLECYSTECTOMY  2006   EVACUATION BREAST HEMATOMA Right 03/13/2023   Procedure: EXCISION CHRONIC RIGHT BREAST ABSCESS;  Surgeon: Abigail Miyamoto, MD;  Location:  [facility];  Service: General;  Laterality: Right;  LMA   HIP ARTHROSCOPY     Patient Active Problem List   Diagnosis Date Noted   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   Allergy with anaphylaxis due to food 05/16/2016   Allergic rhinitis due to pollen 05/16/2016   Moderate persistent asthma 05/16/2016   Right hip pain 05/01/2015   Headache 07/02/2014   Routine health maintenance 01/02/2013   S/P cesarean section 04/20/2011   Current pregnancy with history of pre-term labor 03/17/2011    PCP: Camie Patience, FNP  REFERRING PROVIDER: Camie Patience, FNP  REFERRING DIAG: Ralene Cork, DO  THERAPY DIAG:   Stiffness of left shoulder, not elsewhere classified  Chronic left shoulder pain  Rationale for Evaluation and Treatment: Rehabilitation  ONSET DATE: 2-3 months   SUBJECTIVE:                                                                                                                                                                                      SUBJECTIVE STATEMENT: 05/11/2023 Patient returned to the clinic describing no changes in status. The L shoulder continues  to be sore following each session but overall L shoulder ROM seems to improve following each treatment. Hand dominance: Right  PERTINENT HISTORY: PMHx includes anxiety/depression, asthma, fibromyalgia, Mcnear' disease  PAIN:  Are you having pain? Yes: NPRS scale: 6/10   Pain location: Lt shoulder, with some radiating symptoms  Pain description: sharp, pulling, occasional paresthesia Aggravating factors: stretching/reaching, overhead reaching, bathing/dressing  Relieving factors: none identified  PRECAUTIONS: None   WEIGHT BEARING RESTRICTIONS: No  FALLS:  Has patient fallen in last 6 months? No  LIVING ENVIRONMENT: Lives with: lives with their family Lives in: House/apartment Stairs: Yes: Internal: 12 steps; none and External: 5 steps; none Has following equipment at home: None  OCCUPATION: Sedentary, desk job   PLOF: Independent  PATIENT GOALS: To have decreased pain and improved mobility; ability to return to bathing and dressing without pain.  Ability to reach to shelf without increased symptoms  NEXT MD VISIT: TBD  OBJECTIVE: (objective measures completed at initial evaluation unless otherwise dated)   DIAGNOSTIC FINDINGS:  None performed/available at time of evaluation.  PATIENT SURVEYS:  FOTO 48 current, 65 predicted 05/09/23 FOTO 52   COGNITION: Overall cognitive status: Within functional limits for tasks assessed     SENSATION: Not tested  POSTURE: Slightly rounded shoulders,  Minimal forward while seated  UPPER EXTREMITY ROM:   Active ROM Left eval Left 04/13/23 L 05/09/23  Shoulder flexion 85 AAROM: 105 130 deg  Shoulder extension     Shoulder abduction 48  98 deg   Shoulder adduction     Shoulder internal rotation WNL    Shoulder external rotation 0    Elbow flexion     Elbow extension     Wrist flexion     Wrist extension     Wrist ulnar deviation     Wrist radial deviation     Wrist pronation     Wrist supination     (Blank rows = not tested)  UPPER EXTREMITY MMT: Not formally assessed at eval due to range of motion deficits  MMT Right eval Left eval  Shoulder flexion    Shoulder extension    Shoulder abduction    Shoulder adduction    Shoulder internal rotation    Shoulder external rotation    Middle trapezius    Lower trapezius    Elbow flexion    Elbow extension    Wrist flexion    Wrist extension    Wrist ulnar deviation    Wrist radial deviation    Wrist pronation    Wrist supination    Grip strength (lbs)    (Blank rows = not tested)  SHOULDER SPECIAL TESTS: None performed at evaluation due to range of motion deficits and current mobility deficits  PALPATION:  Moderate to severe tenderness to palpation throughout the left shoulder and parascapular musculature, with most tenderness with palpation to supraspinatus/infraspinatus insertion.    TODAY'S TREATMENT:  [facility] Adult PT Treatment:                                                DATE: 05/11/23 Therapeutic Exercise: Supine: PROM L shoulder: flexion and abduction Seated: Shoulder pulleys Manual Therapy: Supine:  L shoulder mobilizations - AP, lateral, inferior glides L scapular mobilizations - upward rotation with L shoulder abduction Soft tissue mobilization: pin and stretch to L axillary/lateral scapular musculature into L  shoulder flexion Sidelying: Soft tissue mobilization: pin and stretch of the L serratus anterior, L axillary/lateral scapular  musculature/posterior rotator cuff into L shoulder abduction  [facility] Adult PT Treatment:                                                DATE: 05/09/23 Therapeutic Exercise: Supine shoulder flexion AAROM 2x10 cues for comfortable ROM Supine chest press dowel 2x10 cues for pacing  Seated prayer stretch 2 bouts of 3x15sec cues for breath control and setup Seated scaption AAROM w/ dowel x8 cues for hand positioning  Seated ER AAROM w/ dowel x8 cues for elbow positioning HEP update + education/handout   Manual Therapy: Supine: GH passive physiological movement in all planes to pt tolerance w/ gentle oscillations to reduce muscle guarding Passive pin and stretch lat into abduction and flexion Passive pin and stretch subscap into abduction STM deltoid/infraspinatus  Therapeutic Activity: FOTO + education ROM measurements + education    [facility] Adult PT Treatment:                                                DATE: 05/04/23 Therapeutic Exercise: Supine: PROM L shoulder: flexion and abduction Manual Therapy: Supine:  L shoulder mobilizations - AP, lateral, inferior glides L scapular mobilizations - lateral glides L rib mobilizations: PA glides R2-R6 Soft tissue mobilization: pin and stretch to L axillary/lateral scapular musculature into L shoulder flexion  [facility] Adult PT Treatment:                                                DATE: 04/27/23 Therapeutic Exercise: Supine: PROM L shoulder: flexion and external rotation Manual Therapy: Supine:  L shoulder mobilizations - AP, lateral, inferior  L rib mobilizations: PA glides R2-R6, inferior glides R1 Soft tissue mobilization: pin and stretch to L axillary/lateral scapular musculature into L shoulder flexion Soft tissue mobilizatoin: L upper trapezius and L levator scapulae L cervical mobilizations: unilateral PA glides to C5-C7  [facility] Adult PT Treatment:                                                DATE: 04/20/23 Therapeutic  Exercise: Pulley flexion/scaption x2' each Pball flexion/scaption on table x 10 Finger ladder x 5ea flexion/scaption  Sidelying: ER -> Flexion -> Horizontal abd - 15x ea Shoulder circles - 20x ea direction Seated ER with dowel AAROM 2x10 Supine flexion AAROM with dowel 2x10 standing abduction and flexion AAROM with dowel 2x10 Manual Therapy: PROM all motions as tolerated Lt AP/inf joint mobs grade I-III Lt  Hot pack applied during manual   [facility] Adult PT Treatment:                                                DATE: 04/06/2023  Therapeutic Exercise: Pulley flexion/scaption x2' each  Pball flexion on table x 10 Finger ladder x 5ea flexion/scaption  Seated table slides flexion with pillow case x 20   Manual Therapy: PROM all motions as tolerated Lt AP/inf joint mobs grade I-III Lt Long axis distraction    [facility] Adult PT Treatment:                                                DATE: 04/05/23 Therapeutic Exercise: Pulley flexion/scaption x2' each Pball flexion Finger ladder Manual Therapy: PROM all motions as tolerated Lt AP/inf joint mobs grade I-III Lt                                                                                                                                   PATIENT EDUCATION: Education details: rationale for interventions Person educated: Patient Education method: Explanation, Demonstration, and Handouts Education comprehension: verbalized understanding, returned demonstration, and needs further education  HOME EXERCISE PROGRAM: Access Code: [email or web address] URL: [email or web address] Date: 05/09/2023 Prepared by: Fransisco Hertz  Exercises - Standing Single Arm Shoulder Flexion Towel Slide at Table Top  - 2 x daily - 7 x weekly - 1-2 sets - 10 reps - 3 sec hold - Standing Shoulder External Rotation AAROM with Dowel  - 2 x daily - 7 x weekly - 1-2 sets - 10 reps - 2-3 sec hold - Supine Shoulder Flexion with Dowel  - 2 x daily - 7 x weekly -  1-2 sets - 10 reps  ASSESSMENT:  CLINICAL IMPRESSION: 05/11/2023 Continued addressing L shoulder joint and soft tissue extensibility today to restore L shoulder ROM. Sidelying techniques appeared more effective in improving L shoulder abduction compared to supine. Discomfort noted with treatment but not beyond usual amount reported each session. L shoulder ROM continues to be restricted - physical therapy remains indicated.  OBJECTIVE IMPAIRMENTS: decreased activity tolerance, decreased mobility, decreased ROM, decreased strength, impaired UE functional use, postural dysfunction, and pain.   ACTIVITY LIMITATIONS: carrying, lifting, sleeping, bed mobility, bathing, dressing, reach over head, and hygiene/grooming  PARTICIPATION LIMITATIONS: meal prep, cleaning, interpersonal relationship, driving, shopping, and community activity  PERSONAL FACTORS: Time since onset of injury/illness/exacerbation and 1-2 comorbidities: PMHx includes anxiety/depression, asthma, fibromyalgia, Briney' disease  are also affecting patient's functional outcome.   REHAB POTENTIAL: Good  CLINICAL DECISION MAKING: Stable/uncomplicated  EVALUATION COMPLEXITY: Low  GOALS: Goals reviewed with patient? Yes  SHORT TERM GOALS: Target date: 04/22/2023   Patient will be independent with initial home program for shoulder AAROM activities.  Baseline: provided at eval  Goal status: MET  2.  Patient will demonstrate improved postural awareness for at least 15 minutes while seated without need for cueing from PT.   Baseline: see objective measures  Goal status: MET   LONG TERM GOALS: Target date: 05/20/2023   Patient will report  improved overall functional ability with FOTO score of 65 or greater.  Baseline: 48  Goal status: INITIAL  2.  Patient will demonstrate ability to perform overhead lifting of at least 5# using appropriate body mechanics and with no more than minimal pain in order to safely perform normal  daily/occupational tasks.   Baseline: unable  Goal status: INITIAL  3.  Patient will demonstrate improved Lt shoulder AROM to at least 150 shoulder flexion/abduction.   Baseline:  Active ROM Left eval  Shoulder flexion 85  Shoulder extension   Shoulder abduction 48  Shoulder adduction   Shoulder internal rotation WNL  Shoulder external rotation 0   Goal status: INITIAL  4.  Patient will demonstrate improved Lt shoulder strength to at least 4/5 MMT in all directions.  Baseline: unable to formally assess  Goal status: INITIAL  5.  Patient will report ability to performing normal bathing/dressing activities without exacerbation of symptoms.  Baseline: unable Goal status: INITIAL PLAN:  PT FREQUENCY: 2x/week  PT DURATION: 8 weeks  PLANNED INTERVENTIONS: Therapeutic exercises, Therapeutic activity, Neuromuscular re-education, Self Care, Joint mobilization, Dry Needling, Electrical stimulation, Cryotherapy, Moist heat, Taping, Vasopneumatic device, Ionotophoresis 4mg /ml Dexamethasone, Manual therapy, and Re-evaluation  PLAN FOR NEXT SESSION: mobility only per MD referral; PROM/AAROM, manual therapy, TPDN as indicated, modalities as appropriate, ongoing pt education regarding condition   Edmonia Caprio, PT, PhD, DPT  05/11/2023 12:30 PM

## 2023-05-16 ENCOUNTER — Ambulatory Visit: Payer: 59 | Admitting: Physical Therapy

## 2023-05-18 ENCOUNTER — Ambulatory Visit: Payer: 59 | Admitting: Physical Therapy

## 2023-05-24 NOTE — Therapy (Signed)
OUTPATIENT PHYSICAL THERAPY PROGRESS NOTE + RECERTIFICATION   Patient Name: Lisa Day MRN: [id number] DOB:[birth year], 47 y.o., female Today's Date: 05/24/2023   Progress Note Reporting Period 03/25/23 to 05/25/23  See note below for Objective Data and Assessment of Progress/Goals.      END OF SESSION:       Past Medical History:  Diagnosis Date   Allergic rhinitis    Allergy    Anxiety and depression    Asthma    Fibromyalgia    Grave's disease    Headache 07/02/2014   Past Surgical History:  Procedure Laterality Date   APPENDECTOMY  2001   BREAST BIOPSY Right 11/17/2022   [place] RT BREAST BX W LOC DEV 1ST LESION IMG BX SPEC US GUIDE 11/17/2022 [facility]   CESAREAN SECTION  04/20/2011   Procedure: CESAREAN SECTION;  Surgeon: Jeani Hawking, MD;  Location: [facility];  Service: Gynecology;  Laterality: N/A;  Primary Cesarean Section with Delivery Baby Boy @ 0421   CHOLECYSTECTOMY  2006   EVACUATION BREAST HEMATOMA Right 03/13/2023   Procedure: EXCISION CHRONIC RIGHT BREAST ABSCESS;  Surgeon: Abigail Miyamoto, MD;  Location: [facility];  Service: General;  Laterality: Right;  LMA   HIP ARTHROSCOPY     Patient Active Problem List   Diagnosis Date Noted   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  Rationale for Evaluation and Treatment: Rehabilitation  ONSET DATE: 2-3 months   SUBJECTIVE:                                                                                                                                                                                       SUBJECTIVE STATEMENT: 05/24/2023 ***  *** Patient returned to the clinic describing no changes in status. The L shoulder continues to be sore following each session but overall L shoulder ROM seems to improve following each treatment. Hand dominance: Right  PERTINENT HISTORY: PMHx includes anxiety/depression, asthma, fibromyalgia, Holm' disease  PAIN:  Are you having pain? Yes: NPRS scale: 6/10  ***  Pain location: Lt shoulder, with some radiating symptoms  Pain description: sharp, pulling, occasional paresthesia Aggravating factors: stretching/reaching, overhead reaching, bathing/dressing  Relieving  factors: none identified  PRECAUTIONS: None   WEIGHT BEARING RESTRICTIONS: No  FALLS:  Has patient fallen in last 6 months? No  LIVING ENVIRONMENT: Lives with: lives with their family Lives in: House/apartment Stairs: Yes: Internal: 12 steps; none and External: 5 steps; none Has following equipment at home: None  OCCUPATION: Sedentary, desk job   PLOF: Independent  PATIENT GOALS: To have decreased pain and improved mobility; ability to return to bathing and dressing without pain.  Ability to reach to shelf without increased symptoms  NEXT MD VISIT: TBD  OBJECTIVE: (objective measures completed at initial evaluation unless otherwise dated)   DIAGNOSTIC FINDINGS:  None performed/available at time of evaluation.  PATIENT SURVEYS:  FOTO 48 current, 65 predicted 05/09/23 FOTO 52  05/25/23 FOTO ***   COGNITION: Overall cognitive status: Within functional limits for tasks assessed     SENSATION: Not tested  POSTURE: Slightly rounded shoulders, Minimal forward while seated  UPPER EXTREMITY ROM:   Active ROM Left eval Left 04/13/23 L 05/09/23 Left 05/25/23  Shoulder flexion 85 AAROM: 105 130 deg ***  Shoulder extension      Shoulder abduction 48  98 deg  ***  Shoulder  adduction      Shoulder internal rotation WNL     Shoulder external rotation 0     Elbow flexion      Elbow extension      Wrist flexion      Wrist extension      Wrist ulnar deviation      Wrist radial deviation      Wrist pronation      Wrist supination      (Blank rows = not tested)  UPPER EXTREMITY MMT: Not formally assessed at eval due to range of motion deficits  MMT Right eval Left eval  Shoulder flexion    Shoulder extension    Shoulder abduction    Shoulder adduction    Shoulder internal rotation    Shoulder external rotation    Middle trapezius    Lower trapezius    Elbow flexion    Elbow extension    Wrist flexion    Wrist extension    Wrist ulnar deviation    Wrist radial deviation    Wrist pronation    Wrist supination    Grip strength (lbs)    (Blank rows = not tested)  SHOULDER SPECIAL TESTS: None performed at evaluation due to range of motion deficits and current mobility deficits  PALPATION:  Moderate to severe tenderness to palpation throughout the left shoulder and parascapular musculature, with most tenderness with palpation to supraspinatus/infraspinatus insertion.    TODAY'S TREATMENT:  [facility] Adult PT Treatment:                                                DATE: 05/25/23 Therapeutic Exercise: *** Manual Therapy: *** Neuromuscular re-ed: *** Therapeutic Activity: *** Modalities: *** Self Care: ***    [facility] Adult PT Treatment:                                                DATE: 05/11/23 Therapeutic Exercise: Supine: PROM L shoulder: flexion and abduction Seated: Shoulder pulleys Manual Therapy: Supine:  L shoulder mobilizations -  AP, lateral, inferior glides L scapular mobilizations - upward rotation with L shoulder abduction Soft tissue mobilization: pin and stretch to L axillary/lateral scapular musculature into L shoulder flexion Sidelying: Soft tissue mobilization: pin and stretch of the L serratus anterior, L  axillary/lateral scapular musculature/posterior rotator cuff into L shoulder abduction  [facility] Adult PT Treatment:                                                DATE: 05/09/23 Therapeutic Exercise: Supine shoulder flexion AAROM 2x10 cues for comfortable ROM Supine chest press dowel 2x10 cues for pacing  Seated prayer stretch 2 bouts of 3x15sec cues for breath control and setup Seated scaption AAROM w/ dowel x8 cues for hand positioning  Seated ER AAROM w/ dowel x8 cues for elbow positioning HEP update + education/handout   Manual Therapy: Supine: GH passive physiological movement in all planes to pt tolerance w/ gentle oscillations to reduce muscle guarding Passive pin and stretch lat into abduction and flexion Passive pin and stretch subscap into abduction STM deltoid/infraspinatus  Therapeutic Activity: FOTO + education ROM measurements + education    [facility] Adult PT Treatment:                                                DATE: 05/04/23 Therapeutic Exercise: Supine: PROM L shoulder: flexion and abduction Manual Therapy: Supine:  L shoulder mobilizations - AP, lateral, inferior glides L scapular mobilizations - lateral glides L rib mobilizations: PA glides R2-R6 Soft tissue mobilization: pin and stretch to L axillary/lateral scapular musculature into L shoulder flexion  [facility] Adult PT Treatment:                                                DATE: 04/27/23 Therapeutic Exercise: Supine: PROM L shoulder: flexion and external rotation Manual Therapy: Supine:  L shoulder mobilizations - AP, lateral, inferior  L rib mobilizations: PA glides R2-R6, inferior glides R1 Soft tissue mobilization: pin and stretch to L axillary/lateral scapular musculature into L shoulder flexion Soft tissue mobilizatoin: L upper trapezius and L levator scapulae L cervical mobilizations: unilateral PA glides to C5-C7  [facility] Adult PT Treatment:                                                DATE:  04/20/23 Therapeutic Exercise: Pulley flexion/scaption x2' each Pball flexion/scaption on table x 10 Finger ladder x 5ea flexion/scaption  Sidelying: ER -> Flexion -> Horizontal abd - 15x ea Shoulder circles - 20x ea direction Seated ER with dowel AAROM 2x10 Supine flexion AAROM with dowel 2x10 standing abduction and flexion AAROM with dowel 2x10 Manual Therapy: PROM all motions as tolerated Lt AP/inf joint mobs grade I-III Lt  Hot pack applied during manual   [facility] Adult PT Treatment:  DATE: 04/06/2023  Therapeutic Exercise: Pulley flexion/scaption x2' each Pball flexion on table x 10 Finger ladder x 5ea flexion/scaption  Seated table slides flexion with pillow case x 20   Manual Therapy: PROM all motions as tolerated Lt AP/inf joint mobs grade I-III Lt Long axis distraction    [facility] Adult PT Treatment:                                                DATE: 04/05/23 Therapeutic Exercise: Pulley flexion/scaption x2' each Pball flexion Finger ladder Manual Therapy: PROM all motions as tolerated Lt AP/inf joint mobs grade I-III Lt                                                                                                                                   PATIENT EDUCATION: Education details: rationale for interventions Person educated: Patient Education method: Explanation, Demonstration, and Handouts Education comprehension: verbalized understanding, returned demonstration, and needs further education  HOME EXERCISE PROGRAM: Access Code: [email or web address] URL: [email or web address] Date: 05/09/2023 Prepared by: Fransisco Hertz  Exercises - Standing Single Arm Shoulder Flexion Towel Slide at Table Top  - 2 x daily - 7 x weekly - 1-2 sets - 10 reps - 3 sec hold - Standing Shoulder External Rotation AAROM with Dowel  - 2 x daily - 7 x weekly - 1-2 sets - 10 reps - 2-3 sec hold - Supine Shoulder Flexion with Dowel  - 2 x  daily - 7 x weekly - 1-2 sets - 10 reps  ASSESSMENT:  CLINICAL IMPRESSION: 05/24/2023 ***  *** Continued addressing L shoulder joint and soft tissue extensibility today to restore L shoulder ROM. Sidelying techniques appeared more effective in improving L shoulder abduction compared to supine. Discomfort noted with treatment but not beyond usual amount reported each session. L shoulder ROM continues to be restricted - physical therapy remains indicated.  OBJECTIVE IMPAIRMENTS: decreased activity tolerance, decreased mobility, decreased ROM, decreased strength, impaired UE functional use, postural dysfunction, and pain.   ACTIVITY LIMITATIONS: carrying, lifting, sleeping, bed mobility, bathing, dressing, reach over head, and hygiene/grooming  PARTICIPATION LIMITATIONS: meal prep, cleaning, interpersonal relationship, driving, shopping, and community activity  PERSONAL FACTORS: Time since onset of injury/illness/exacerbation and 1-2 comorbidities: PMHx includes anxiety/depression, asthma, fibromyalgia, Delbene' disease  are also affecting patient's functional outcome.   REHAB POTENTIAL: Good  CLINICAL DECISION MAKING: Stable/uncomplicated  EVALUATION COMPLEXITY: Low  GOALS: Goals reviewed with patient? Yes  SHORT TERM GOALS: Target date: 04/22/2023   Patient will be independent with initial home program for shoulder AAROM activities.  Baseline: provided at eval  Goal status: MET  2.  Patient will demonstrate improved postural awareness for at least 15 minutes while seated without need for cueing from PT.   Baseline: see objective measures  Goal status: MET  LONG TERM GOALS: Target date: 05/20/2023   Patient will report improved overall functional ability with FOTO score of 65 or greater.  Baseline: 48  05/25/23: ***  Goal status: ***   2.  Patient will demonstrate ability to perform overhead lifting of at least 5# using appropriate body mechanics and with no more than minimal  pain in order to safely perform normal daily/occupational tasks.   Baseline: unable  05/25/23: ***  Goal status: ***   3.  Patient will demonstrate improved Lt shoulder AROM to at least 150 shoulder flexion/abduction.   Baseline:  Active ROM Left eval  Shoulder flexion 85  Shoulder extension   Shoulder abduction 48  Shoulder adduction   Shoulder internal rotation WNL  Shoulder external rotation 0   05/25/23: ***  Goal status: ***   4.  Patient will demonstrate improved Lt shoulder strength to at least 4/5 MMT in all directions.  Baseline: unable to formally assess  05/25/23: ***  Goal status: ***   5.  Patient will report ability to performing normal bathing/dressing activities without exacerbation of symptoms.  Baseline: unable 05/25/23: ***  Goal status: ***  PLAN: updated 05/25/23  PT FREQUENCY: 2x/week ***   PT DURATION: 8 weeks ***   PLANNED INTERVENTIONS: Therapeutic exercises, Therapeutic activity, Neuromuscular re-education, Self Care, Joint mobilization, Dry Needling, Electrical stimulation, Cryotherapy, Moist heat, Taping, Vasopneumatic device, Ionotophoresis 4mg /ml Dexamethasone, Manual therapy, and Re-evaluation  PLAN FOR NEXT SESSION: mobility only per MD referral; PROM/AAROM, manual therapy, TPDN as indicated, modalities as appropriate, ongoing pt education regarding condition ***   Ashley Murrain PT, DPT 05/24/2023 11:47 AM

## 2023-05-25 ENCOUNTER — Encounter: Payer: Self-pay | Admitting: Physical Therapy

## 2023-05-25 ENCOUNTER — Ambulatory Visit: Payer: 59 | Attending: Sports Medicine | Admitting: Physical Therapy

## 2023-05-25 DIAGNOSIS — G8929 Other chronic pain: Secondary | ICD-10-CM | POA: Diagnosis present

## 2023-05-25 DIAGNOSIS — M25612 Stiffness of left shoulder, not elsewhere classified: Secondary | ICD-10-CM | POA: Diagnosis present

## 2023-05-25 DIAGNOSIS — M25512 Pain in left shoulder: Secondary | ICD-10-CM | POA: Insufficient documentation

## 2023-06-29 ENCOUNTER — Ambulatory Visit (INDEPENDENT_AMBULATORY_CARE_PROVIDER_SITE_OTHER): Payer: 59 | Admitting: Internal Medicine

## 2023-06-29 ENCOUNTER — Encounter: Payer: Self-pay | Admitting: Internal Medicine

## 2023-06-29 VITALS — BP 120/82 | HR 79 | Ht 63.0 in | Wt 135.8 lb

## 2023-06-29 DIAGNOSIS — Z8639 Personal history of other endocrine, nutritional and metabolic disease: Secondary | ICD-10-CM

## 2023-06-29 DIAGNOSIS — E890 Postprocedural hypothyroidism: Secondary | ICD-10-CM | POA: Diagnosis not present

## 2023-06-29 NOTE — Progress Notes (Signed)
Patient ID: Lisa HAMLETTE, female   DOB: [birth year], 47 y.o.   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  Last visit 1 year ago.  Interim hx: She has no complaints at today's visit.  No fatigue, palpitations, heat intolerance. She previously had blurry and double vision and started to see Dr. Dimas Millin.  She started Jordan after our last visit- finished 3 weeks ago (had 8 sessions) >> eye pain, swelling, and dryness resolved. No palpitations or unintentional weight loss but she lost 35 pounds since our last visit. This is intentional - intermittent fasting 11 am-7 pm, no starches, no concentrated sweets, no fried foods, drinks water.  Reviewed history: Pt. has been dx with Sermon' disease in 2006 and developed post-ablative hypothyroidism after RAI treatment >> on Levothyroxine 175 mcg >> now on Tirosint 150 mcg + added 25 mcg daily in 08/2016.   She had more nausea and migraines since starting Tirosint.   We changed to Synthroid d.a.w in an effort to minimize adverse effects from the thyroid hormone tablet.  Her nausea resolved after switching to Synthroid 50 mcg tablets.  Previously, she was taking 200 mcg of Synthroid daily, but in 08/2019 she could not refill the medication due to an absence of 2 years from the office, so she was off the medication for 2 weeks.  The TSH returned very high, in the 80s, and she was feeling very poorly: Fatigue, hair loss, weight gain, dry skin.  These have all improved afterwards.    We started LT4 150 mcg daily (changed to generic 10/2019 due to insurance coverage) her TFTs improved to normal.  However, at last visit, in 05/2020, TSH was again very high...  In 04-12/2020: TSH reportedly suppressed >> dose of LT4 decreased to 100 mcg daily by OBGYN dr. (I was not aware...)    06-02/2021: TSH reportedly normal -however, per check of  records from [place] after the visit: TSH was 38.6, very high  In 06/2021, TSH was still elevated, at 30.1, so increased her LT4 dose to 125 mcg daily.  In 12/2022, we decreased LT4 dose to 112 mcg daily.  Pt is currently on levothyroxine 112 mcg daily, taken: - in am - fasting - at least 30 min from b'fast - no calcium - no iron - no multivitamins - no PPIs - not on Biotin; on B12  Reviewed her TFTs: Lab Results  Component Value Date   TSH 0.20 (L) 12/27/2022   TSH 1.01 06/28/2022   TSH 2.45 12/24/2021   TSH 1.99 09/16/2021   TSH 30.100 (H) 06/25/2021   TSH 36.800 (H) 06/19/2020   TSH 0.605 12/13/2019   TSH 80.34 (H) 09/17/2019   TSH 0.03 (L) 09/07/2018   TSH 12.63 (A) 06/04/2018   FREET4 1.39 12/27/2022   FREET4 1.35 06/28/2022   FREET4 0.76 12/24/2021   FREET4 1.07 09/16/2021   FREET4 0.83 06/25/2021   FREET4 0.91 06/19/2020   FREET4 1.70 12/13/2019   FREET4 0.00 Repeated and verified X2. (L) 09/17/2019   FREET4 1.58 09/14/2018   FREET4 1.71 (H) 09/08/2017  02/12/2021: TSH 38.6 08/03/2016: TSH 22.47 06/20/2016: TSH 8.52  05/03/2016: TSH 47.3, free T4 0.28, free T3 2.36 05/25/2008: TSH 0.01, free T4 4.58   Lab Results  Component Value Date   TSI 97 06/28/2022   Pt denies: - feeling nodules in neck - hoarseness - dysphagia -  choking  No FH of thyroid cancer. No h/o radiation tx to head or neck. No herbal supplements. No Biotin use. No recent steroids use.  She also has a history of asthma, headaches, fibromyalgia-previously on Cymbalta, amitriptyline. Prev. On B12 injections, now on p.o. B12.  ROS: + see HPI   I reviewed pt's medications, allergies, PMH, social hx, family hx, and changes were documented in the history of present illness. Otherwise, unchanged from my initial visit note.  Past Medical History:  Diagnosis Date   Allergic rhinitis    Allergy    Anxiety and depression    Asthma    Fibromyalgia    Grave's disease    Headache  07/02/2014   Past Surgical History:  Procedure Laterality Date   APPENDECTOMY  2001   BREAST BIOPSY Right 11/17/2022   [place] RT BREAST BX W LOC DEV 1ST LESION IMG BX SPEC US GUIDE 11/17/2022 [facility]MO[facility]   CESAREAN SECTION  04/20/2011   Procedure: CESAREAN SECTION;  Surgeon: Jeani Hawking, MD;  Location: [facility];  Service: Gynecology;  Laterality: N/A;  Primary Cesarean Section with Delivery Baby Boy @ 0421   CHOLECYSTECTOMY  2006   EVACUATION BREAST HEMATOMA Right 03/13/2023   Procedure: EXCISION CHRONIC RIGHT BREAST ABSCESS;  Surgeon: Abigail Miyamoto, MD;  Location: [facility];  Service: General;  Laterality: Right;  LMA   HIP ARTHROSCOPY     Social History   Social History   Marital status: Single    Spouse name: N/A   Number of children: 2   Occupational History   research    Social History Main Topics   Smoking status: Never Smoker   Smokeless tobacco: Never Used   Alcohol use No   Drug use: No   Current Outpatient Medications on File Prior to Visit  Medication Sig Dispense Refill   albuterol (PROVENTIL HFA;VENTOLIN HFA) 108 (90 Base) MCG/ACT inhaler Inhale 2 puffs into the lungs every 4 (four) hours as needed for wheezing or shortness of breath. 1 Inhaler 2   amphetamine-dextroamphetamine (ADDERALL) 15 MG tablet      beclomethasone (QVAR) 80 MCG/ACT inhaler Inhale 2 puffs into the lungs 2 (two) times daily. (Patient taking differently: Inhale 1 puff into the lungs 2 (two) times daily.) 1 Inhaler 5   cyclobenzaprine (FLEXERIL) 10 MG tablet 1 TABLET ORALLY ONCE DAILY AS NEEDED MUSCLE SPASMS 30 DAYS     EPINEPHrine 0.3 mg/0.3 mL IJ SOAJ injection USE AS DIRECTED FOR SEVERE ALLERGIC REACTION. 2 Device 2   Ergocalciferol (VITAMIN D2 PO) 1 CAPSULE ORALLY ONCE A WEEK FOR 60 DAYS     fluticasone (FLONASE) 50 MCG/ACT nasal spray Place 2 sprays into both nostrils at bedtime. 16 g 5   gabapentin (NEURONTIN) 300 MG capsule Take 1 capsule (300 mg total) by mouth at  bedtime. (Patient not taking: Reported on 04/07/2023) 30 capsule 0   levocetirizine (XYZAL) 5 MG tablet Take 1 tablet (5 mg total) by mouth every evening. 30 tablet 5   levonorgestrel (MIRENA) 20 MCG/24HR IUD 1 each by Intrauterine route once.     levothyroxine (SYNTHROID) 112 MCG tablet Take 1 tablet (112 mcg total) by mouth daily before breakfast. 45 tablet 3   LORazepam (ATIVAN PO) Take by mouth.     Lumateperone Tosylate (CAPLYTA PO) Take by mouth.     meloxicam (MOBIC) 15 MG tablet TAKE 1 TABLET BY MOUTH EVERY DAY FOR 30 DAYS     montelukast (SINGULAIR) 10 MG tablet Take 1 tablet (  10 mg total) by mouth at bedtime. 30 tablet 0   oxyCODONE (OXY IR/ROXICODONE) 5 MG immediate release tablet Take 1 tablet (5 mg total) by mouth every 6 (six) hours as needed for moderate pain, severe pain or breakthrough pain. 20 tablet 0   zolpidem (AMBIEN) 5 MG tablet Take by mouth.     No current facility-administered medications on file prior to visit.   Allergies  Allergen Reactions   Shrimp [Shellfish Allergy] Hives, Shortness Of Breath and Swelling   Watermelon Concentrate Hives, Shortness Of Breath and Swelling   Gadolinium Derivatives Nausea And Vomiting   Latex Rash   Family History  Problem Relation Age of Onset   Hypertension Mother    Allergic rhinitis Father    Asthma Father    Migraines Sister    Migraines Brother    Allergic rhinitis Brother    Esophageal cancer Maternal Grandmother    Breast cancer Maternal Grandmother    Angioedema Neg Hx    Eczema Neg Hx    Immunodeficiency Neg Hx    Urticaria Neg Hx    Colon polyps Neg Hx    Colon cancer Neg Hx    Rectal cancer Neg Hx    Stomach cancer Neg Hx    PE: BP 120/82   Pulse 79   Ht 5\' 3"  (1.6 m)   Wt 135 lb 12.8 oz (61.6 kg)   SpO2 99%   BMI 24.06 kg/m  Wt Readings from Last 10 Encounters:  06/29/23 135 lb 12.8 oz (61.6 kg)  04/07/23 145 lb (65.8 kg)  03/17/23 145 lb (65.8 kg)  03/13/23 147 lb 14.9 oz (67.1 kg)  03/08/23  140 lb (63.5 kg)  02/01/23 140 lb (63.5 kg)  06/28/22 168 lb 9.6 oz (76.5 kg)  12/24/21 177 lb 12.8 oz (80.6 kg)  06/25/21 165 lb 3.2 oz (74.9 kg)  06/19/20 173 lb 6.4 oz (78.7 kg)   Constitutional: Normal weight, in NAD Eyes:  EOMI, + very mild exophthalmos; no palpebral edema. ENT: no neck masses, no cervical lymphadenopathy Cardiovascular: RRR, No MRG Respiratory: CTA B Musculoskeletal: no deformities Skin:no rashes Neurological: no tremor with outstretched hands  ASSESSMENT: 1. Postablative Hypothyroidism  2.  H/o Sterbenz ds.  PLAN:  1. Patient with longstanding, uncontrolled, hypothyroidism, with h/o being lost for follow-up for 2 years before her visit in 08/2019, during which she ran out of levothyroxine.  At that time she was on 200 mcg a day.  A TSH obtained at that time was very high, at 80.  We restarted levothyroxine at a lower dose and her TFTs normalized.  Afterwards we were able to decrease the dose further. - latest thyroid labs reviewed with pt. >> normal: Lab Results  Component Value Date   TSH 0.20 (L) 12/27/2022  - she continues on LT4 112 mcg daily, dose decreased after the above results returned -we discussed that if we need to change the dose again, we need to check labs in approximately 5 to 6 weeks - pt feels good on this dose.  She had a significant weight loss since last visit, more than 30 pounds, after she made salutary changes in her diet. - we discussed about taking the thyroid hormone every day, with water, >30 minutes before breakfast, separated by >4 hours from acid reflux medications, calcium, iron, multivitamins. Pt. is taking it correctly. - will check thyroid tests today: TSH and fT4 - If labs are abnormal, she will need to return for repeat TFTs in 1.5  months  2.  H/o Bade ds. -Patient with a history of Wyman' disease with persistent Muff' ophthalmopathy.  She continues to have dry eyes and palpebral swelling. -He sees Dr. Dimas Millin with  ophthalmology.  She had Tepezza >> eye pain, swelling, and dryness resolved -At last visit we checked her TSI's and these were normal.  We will not repeat them today  Needs refills.  Component     Latest Ref Rng 06/29/2023  TSH     0.35 - 5.50 uIU/mL 0.03 (L)   T4,Free(Direct)     0.60 - 1.60 ng/dL 0.98 (H)    TSH is suppressed.  This is most likely related to the weight loss.  I will advise her to decrease the dose of LT4 to 100 mcg daily and have her back for labs in 1.5 months.  Carlus Pavlov, MD PhD [facility] Endocr[facility]

## 2023-06-29 NOTE — Patient Instructions (Signed)
Please continue Levothyroxine 112 mcg daily.  Take the thyroid hormone every day, with water, at least 30 minutes before breakfast, separated by at least 4 hours from: - acid reflux medications - calcium - iron - multivitamins  Please stop at the lab.  Please come back for a follow-up appointment in 1 year.

## 2023-06-30 LAB — TSH: TSH: 0.03 u[IU]/mL — ABNORMAL LOW (ref 0.35–5.50)

## 2023-06-30 LAB — T4, FREE: Free T4: 1.96 ng/dL — ABNORMAL HIGH (ref 0.60–1.60)

## 2023-06-30 MED ORDER — LEVOTHYROXINE SODIUM 100 MCG PO TABS
100.0000 ug | ORAL_TABLET | Freq: Every day | ORAL | 1 refills | Status: DC
Start: 2023-06-30 — End: 2023-11-09

## 2023-08-04 ENCOUNTER — Other Ambulatory Visit: Payer: Self-pay | Admitting: Internal Medicine

## 2023-09-14 ENCOUNTER — Encounter (HOSPITAL_COMMUNITY): Payer: Self-pay | Admitting: Emergency Medicine

## 2023-09-14 ENCOUNTER — Emergency Department (HOSPITAL_COMMUNITY): Payer: 59

## 2023-09-14 ENCOUNTER — Other Ambulatory Visit: Payer: Self-pay

## 2023-09-14 ENCOUNTER — Emergency Department (HOSPITAL_COMMUNITY)
Admission: EM | Admit: 2023-09-14 | Discharge: 2023-09-15 | Disposition: A | Discharge status: Home / Self Care | Payer: 59 | Attending: Student | Admitting: Student

## 2023-09-14 DIAGNOSIS — R079 Chest pain, unspecified: Secondary | ICD-10-CM | POA: Diagnosis present

## 2023-09-14 DIAGNOSIS — Z7951 Long term (current) use of inhaled steroids: Secondary | ICD-10-CM | POA: Insufficient documentation

## 2023-09-14 DIAGNOSIS — Z20822 Contact with and (suspected) exposure to covid-19: Secondary | ICD-10-CM | POA: Insufficient documentation

## 2023-09-14 DIAGNOSIS — J45909 Unspecified asthma, uncomplicated: Secondary | ICD-10-CM | POA: Insufficient documentation

## 2023-09-14 DIAGNOSIS — Z91040 Latex allergy status: Secondary | ICD-10-CM | POA: Diagnosis not present

## 2023-09-14 DIAGNOSIS — R1011 Right upper quadrant pain: Secondary | ICD-10-CM | POA: Diagnosis not present

## 2023-09-14 DIAGNOSIS — R0789 Other chest pain: Secondary | ICD-10-CM | POA: Diagnosis not present

## 2023-09-14 LAB — BASIC METABOLIC PANEL
Anion gap: 9 (ref 5–15)
BUN: 14 mg/dL (ref 6–20)
CO2: 27 mmol/L (ref 22–32)
Calcium: 10.1 mg/dL (ref 8.9–10.3)
Chloride: 101 mmol/L (ref 98–111)
Creatinine, Ser: 0.95 mg/dL (ref 0.44–1.00)
GFR, Estimated: >60 mL/min (ref >60)
Glucose, Bld: 101 mg/dL — ABNORMAL HIGH (ref 70–99)
Potassium: 3.8 mmol/L (ref 3.5–5.1)
Sodium: 137 mmol/L (ref 135–145)

## 2023-09-14 LAB — CBC
HCT: 34.5 % — ABNORMAL LOW (ref 36.0–46.0)
Hemoglobin: 11.6 g/dL — ABNORMAL LOW (ref 12.0–15.0)
MCH: 28.4 pg (ref 26.0–34.0)
MCHC: 33.6 g/dL (ref 30.0–36.0)
MCV: 84.6 fL (ref 80.0–100.0)
Platelets: 269 10*3/uL (ref 150–400)
RBC: 4.08 MIL/uL (ref 3.87–5.11)
RDW: 13.6 % (ref 11.5–15.5)
WBC: 5.3 10*3/uL (ref 4.0–10.5)
nRBC: 0 % (ref 0.0–0.2)

## 2023-09-14 LAB — TROPONIN I (HIGH SENSITIVITY)
Troponin I (High Sensitivity): 2 ng/L (ref <18)
Troponin I (High Sensitivity): 2 ng/L (ref <18)

## 2023-09-14 MED ORDER — ALUM & MAG HYDROXIDE-SIMETH 200-200-20 MG/5ML PO SUSP
30.0000 mL | Freq: Once | ORAL | Status: AC
  Administered 2023-09-15: 30 mL via ORAL
  Filled 2023-09-14: qty 30

## 2023-09-14 MED ORDER — LIDOCAINE VISCOUS HCL 2 % MT SOLN
15.0000 mL | Freq: Once | OROMUCOSAL | Status: AC
  Administered 2023-09-15: 15 mL via ORAL
  Filled 2023-09-14: qty 15

## 2023-09-14 NOTE — ED Triage Notes (Signed)
Pt reports chest pain, cough, and chills that started yesterday. Denies any sick contacts.

## 2023-09-14 NOTE — ED Provider Triage Note (Signed)
Emergency Medicine Provider Triage Evaluation Note  Lisa Day , a 48 y.o. female  was evaluated in triage.  Pt complains of cp x 2 days. Worse with lying down. + cough no fever.  Review of Systems  Positive: cp Negative: vomit  Physical Exam  BP (!) 136/98 (BP Location: Right Arm)   Pulse (!) 108   Temp 98.4 F (36.9 C)   Resp 16   SpO2 100%  Gen:   Awake, no distress   Resp:  Normal effort  MSK:   Moves extremities without difficulty  Other:    Medical Decision Making  Medically screening exam initiated at 6:58 PM.  Appropriate orders placed.  CHANTERIA LOERA was informed that the remainder of the evaluation will be completed by another provider, this initial triage assessment does not replace that evaluation, and the importance of remaining in the ED until their evaluation is complete.     Arthor Captain, PA-C 09/14/23 1901

## 2023-09-14 NOTE — ED Provider Notes (Signed)
[place] EMERGENCY DEPARTMENT AT [facility] Provider Note  CSN: [id number] Arrival date & time: 09/14/23 1754  Chief Complaint(s) Chest Pain  HPI Lisa Day is a 48 y.o. female with PMH asthma, fibromyalgia, Fosse' disease, cholecystectomy who presents emergency department for evaluation of abdominal pain and chest pain.  States that symptoms began abruptly last night after laying down with colicky exacerbations of pain followed by periods of relief.  Endorses some mild associated shortness of breath and back pain.  Denies diaphoresis, vomiting, headache, fever or other systemic symptoms.  Does endorse some chills last night.   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 Authorizing Provider  albuterol (PROVENTIL HFA;VENTOLIN HFA) 108 (90 Base) MCG/ACT inhaler Inhale 2 puffs into the lungs every 4 (four) hours as needed for wheezing or shortness of breath. 05/16/16   Fletcher Anon, MD  amphetamine-dextroamphetamine (ADDERALL) 15 MG tablet     [provider]  beclomethasone (QVAR) 80 MCG/ACT inhaler Inhale 2 puffs into the lungs 2 (two) times daily. Patient taking differently: Inhale 1 puff into the lungs 2 (two) times daily. 05/16/16   Fletcher Anon, MD  cyclobenzaprine (FLEXERIL) 10 MG tablet 1 TABLET  ORALLY ONCE DAILY AS NEEDED MUSCLE SPASMS 30 DAYS    [provider]  EPINEPHrine 0.3 mg/0.3 mL IJ SOAJ injection USE AS DIRECTED FOR SEVERE ALLERGIC REACTION. 05/16/16   Fletcher Anon, MD  Ergocalciferol (VITAMIN D2 PO) 1 CAPSULE ORALLY ONCE A WEEK FOR 60 DAYS Patient not taking: Reported on 06/29/2023    [provider]  fluticasone (FLONASE) 50 MCG/ACT nasal spray Place 2 sprays into both nostrils at bedtime. Patient not taking: Reported on 06/29/2023 05/16/16   Fletcher Anon, MD  levocetirizine (XYZAL) 5 MG tablet Take 1 tablet (5 mg total) by mouth every evening. 05/16/16   Fletcher Anon, MD  levonorgestrel (MIRENA) 20 MCG/24HR IUD 1 each by Intrauterine route once.    [provider]  levothyroxine (SYNTHROID) 100 MCG tablet Take 1 tablet (100 mcg total) by mouth daily before breakfast. 06/30/23   Carlus Pavlov, MD  levothyroxine (SYNTHROID) 112 MCG tablet TAKE 1 TABLET BY MOUTH DAILY  BEFORE BREAKFAST 08/04/23   Carlus Pavlov, MD  LORazepam (ATIVAN PO) Take by mouth.    [provider]  Lumateperone Tosylate (CAPLYTA PO) Take by mouth. Patient not taking: Reported on 06/29/2023    [provider]  meloxicam (MOBIC) 15 MG tablet TAKE 1 TABLET BY MOUTH EVERY DAY FOR 30 DAYS 01/24/23   [provider]  montelukast (SINGULAIR) 10 MG tablet Take 1 tablet (10 mg total) by mouth at bedtime. 02/15/17   Fletcher Anon, MD  zolpidem (AMBIEN) 5 MG tablet Take by mouth.    [provider]  Past Surgical History Past Surgical History:  Procedure Laterality Date   APPENDECTOMY  2001   BREAST BIOPSY Right 11/17/2022   [place] RT BREAST BX W LOC DEV 1ST LESION IMG BX SPEC US GUIDE 11/17/2022 [facility]   CESAREAN SECTION  04/20/2011   Procedure: CESAREAN SECTION;  Surgeon: Jeani Hawking, MD;   Location: [facility];  Service: Gynecology;  Laterality: N/A;  Primary Cesarean Section with Delivery Baby Boy @ 0421   CHOLECYSTECTOMY  2006   EVACUATION BREAST HEMATOMA Right 03/13/2023   Procedure: EXCISION CHRONIC RIGHT BREAST ABSCESS;  Surgeon: Abigail Miyamoto, MD;  Location: [facility];  Service: General;  Laterality: Right;  LMA   HIP ARTHROSCOPY     Family History Family History  Problem Relation Age of Onset   Hypertension Mother    Allergic rhinitis Father    Asthma Father    Migraines Sister    Migraines Brother    Allergic rhinitis Brother    Esophageal cancer Maternal Grandmother    Breast cancer Maternal Grandmother    Angioedema Neg Hx    Eczema Neg Hx    Immunodeficiency Neg Hx    Urticaria Neg Hx    Colon polyps Neg Hx    Colon cancer Neg Hx    Rectal cancer Neg Hx    Stomach cancer Neg Hx     Social History Social History   Tobacco Use   Smoking status: Never   Smokeless tobacco: Never  Vaping Use   Vaping status: Never Used  Substance Use Topics   Alcohol use: No    Alcohol/week: 0.0 standard drinks of alcohol   Drug use: No   Allergies Shrimp [shellfish allergy], Watermelon concentrate, Gadolinium derivatives, and Latex  Review of Systems Review of Systems  Constitutional:  Positive for chills.  Respiratory:  Positive for shortness of breath.   Cardiovascular:  Positive for chest pain.  Gastrointestinal:  Positive for abdominal pain.    Physical Exam Vital Signs  I have reviewed the triage vital signs BP 122/69 (BP Location: Right Arm)   Pulse 80   Temp 98.4 F (36.9 C) (Oral)   Resp 20   SpO2 100%   Physical Exam Vitals and nursing note reviewed.  Constitutional:      General: She is not in acute distress.    Appearance: She is well-developed.  HENT:     Head: Normocephalic and atraumatic.  Eyes:     Conjunctiva/sclera: Conjunctivae normal.  Cardiovascular:     Rate and Rhythm: Normal rate and regular rhythm.      Heart sounds: No murmur heard. Pulmonary:     Effort: Pulmonary effort is normal. No respiratory distress.     Breath sounds: Normal breath sounds.  Abdominal:     Palpations: Abdomen is soft.     Tenderness: There is abdominal tenderness.  Musculoskeletal:        General: No swelling.     Cervical back: Neck supple.  Skin:    General: Skin is warm and dry.     Capillary Refill: Capillary refill takes less than 2 seconds.  Neurological:     Mental Status: She is alert.  Psychiatric:        Mood and Affect: Mood normal.     ED Results and Treatments Labs (all labs ordered are listed, but only abnormal results are displayed) Labs Reviewed  BASIC METABOLIC PANEL - Abnormal; Notable for the following components:      Result Value  Glucose, Bld 101 (*)    All other components within normal limits  CBC - Abnormal; Notable for the following components:   Hemoglobin 11.6 (*)    HCT 34.5 (*)    All other components within normal limits  RESP PANEL BY RT-PCR (RSV, FLU A&B, COVID)  RVPGX2  TROPONIN I (HIGH SENSITIVITY)  TROPONIN I (HIGH SENSITIVITY)                                                                                                                          Radiology DG Chest 2 View Result Date: 09/14/2023 CLINICAL DATA:  Chest pain EXAM: CHEST - 2 VIEW COMPARISON:  X-ray 05/22/2013. FINDINGS: Hyperinflation. No consolidation, pneumothorax or effusion. No edema. Normal cardiopericardial silhouette. Surgical clips in the right upper quadrant of the abdomen. IMPRESSION: No acute cardiopulmonary disease. Electronically Signed   By: Karen Kays M.D.   On: 09/14/2023 19:56    Pertinent labs & imaging results that were available during my care of the patient were reviewed by me and considered in my medical decision making (see MDM for details).  Medications Ordered in ED Medications  alum & mag hydroxide-simeth (MAALOX/MYLANTA) 200-200-20 MG/5ML suspension 30 mL (has no  administration in time range)    And  lidocaine (XYLOCAINE) 2 % viscous mouth solution 15 mL (has no administration in time range)                                                                                                                                     Procedures Procedures  (including critical care time)  Medical Decision Making / ED Course   This patient presents to the ED for concern of chest pain, abdominal pain, this involves an extensive number of treatment options, and is a complaint that carries with it a high risk of complications and morbidity.  The differential diagnosis includes cholecystitis, cholelithiasis/biliary colic, choledocholithiasis, ascending cholangitis, acute hepatitis, pancreatitis, GERD, pneumonia, constipation, nephrolithiasis, ACS  MDM: Patient seen emergency room for evaluation of chest and abdominal pain.  Physical exam with tenderness in the right upper quadrant and epigastrium but is otherwise unremarkable.  Cardio pulmonary exam unremarkable.  Laboratory evaluation reassuringly unremarkable with negative high-sensitivity troponin, COVID, flu, RSV negative, hemoglobin 11.6.  Chest x-ray unremarkable.  CT abdomen pelvis obtained in setting of right upper quadrant pain that is reassuringly negative for acute pathology.  Heart  score is low and I have low suspicion for ACS at this time.  Patient PERC negative and low suspicion for PE.  At this time, patient does not meet inpatient criteria for admission and will be discharged with outpatient follow-up.  Return precautions given of which she voiced understanding she was discharged.   Additional history obtained:  -External records from outside source obtained and reviewed including: Chart review including previous notes, labs, imaging, consultation notes   Lab Tests: -I ordered, reviewed, and interpreted labs.   The pertinent results include:   Labs Reviewed  BASIC METABOLIC PANEL - Abnormal; Notable  for the following components:      Result Value   Glucose, Bld 101 (*)    All other components within normal limits  CBC - Abnormal; Notable for the following components:   Hemoglobin 11.6 (*)    HCT 34.5 (*)    All other components within normal limits  RESP PANEL BY RT-PCR (RSV, FLU A&B, COVID)  RVPGX2  TROPONIN I (HIGH SENSITIVITY)  TROPONIN I (HIGH SENSITIVITY)      EKG   EKG Interpretation Date/Time:  Thursday September 14 2023 18:07:18 EST Ventricular Rate:  99 PR Interval:  130 QRS Duration:  72 QT Interval:  332 QTC Calculation: 426 R Axis:   28  Text Interpretation: Normal sinus rhythm Right atrial enlargement Abnormal ECG When compared with ECG of 22-May-2013 18:23, PREVIOUS ECG IS PRESENT Confirmed by Fishel Wamble ([id number]) on 09/14/2023 11:36:23 PM         Imaging Studies ordered: I ordered imaging studies including chest x-ray, CTAP I independently visualized and interpreted imaging. I agree with the radiologist interpretation   Medicines ordered and prescription drug management: Meds ordered this encounter  Medications   AND Linked Order Group    alum & mag hydroxide-simeth (MAALOX/MYLANTA) 200-200-20 MG/5ML suspension 30 mL    lidocaine (XYLOCAINE) 2 % viscous mouth solution 15 mL    -I have reviewed the patients home medicines and have made adjustments as needed  Critical interventions none   Social Determinants of Health:  Factors impacting patients care include: none   Reevaluation: After the interventions noted above, I reevaluated the patient and found that they have :improved  Co morbidities that complicate the patient evaluation  Past Medical History:  Diagnosis Date   Allergic rhinitis    Allergy    Anxiety and depression    Asthma    Fibromyalgia    Grave's disease    Headache 07/02/2014      Dispostion: I considered admission for this patient, but at this time she does not meet inpatient criteria for admission and will be  discharged with outpatient follow-up     Final Clinical Impression(s) / ED Diagnoses Final diagnoses:  None     @PCDICTATION @    Minah Axelrod, Wyn Forster, MD 09/15/23 0330

## 2023-09-15 ENCOUNTER — Emergency Department (HOSPITAL_COMMUNITY): Payer: 59

## 2023-09-15 LAB — HEPATIC FUNCTION PANEL
ALT: 27 U/L (ref 0–44)
AST: 24 U/L (ref 15–41)
Albumin: 3.9 g/dL (ref 3.5–5.0)
Alkaline Phosphatase: 67 U/L (ref 38–126)
Bilirubin, Direct: <0.1 mg/dL (ref 0.0–0.2)
Total Bilirubin: 0.6 mg/dL (ref 0.0–1.2)
Total Protein: 7.7 g/dL (ref 6.5–8.1)

## 2023-09-15 LAB — HCG, SERUM, QUALITATIVE: Preg, Serum: NEGATIVE

## 2023-09-15 LAB — RESP PANEL BY RT-PCR (RSV, FLU A&B, COVID)  RVPGX2
Influenza A by PCR: NEGATIVE
Influenza B by PCR: NEGATIVE
Resp Syncytial Virus by PCR: NEGATIVE
SARS Coronavirus 2 by RT PCR: NEGATIVE

## 2023-09-15 MED ORDER — DICYCLOMINE HCL 20 MG PO TABS: 20.0000 mg | ORAL_TABLET | Freq: Two times a day (BID) | ORAL | 0 refills | Status: AC

## 2023-09-15 MED ORDER — IOHEXOL 350 MG/ML SOLN
75.0000 mL | Freq: Once | INTRAVENOUS | Status: AC | PRN
  Administered 2023-09-15: 75 mL via INTRAVENOUS

## 2023-09-15 MED ORDER — NAPROXEN 375 MG PO TABS: 375.0000 mg | ORAL_TABLET | Freq: Two times a day (BID) | ORAL | 0 refills | Status: AC

## 2023-09-15 MED ORDER — KETOROLAC TROMETHAMINE 15 MG/ML IJ SOLN
15.0000 mg | Freq: Once | INTRAMUSCULAR | Status: AC
  Administered 2023-09-15: 15 mg via INTRAVENOUS
  Filled 2023-09-15: qty 1

## 2023-11-08 ENCOUNTER — Other Ambulatory Visit: Payer: Self-pay | Admitting: Internal Medicine

## 2023-11-08 DIAGNOSIS — E890 Postprocedural hypothyroidism: Secondary | ICD-10-CM

## 2024-03-11 ENCOUNTER — Other Ambulatory Visit: Payer: Self-pay | Admitting: Obstetrics and Gynecology

## 2024-03-11 DIAGNOSIS — Z1231 Encounter for screening mammogram for malignant neoplasm of breast: Secondary | ICD-10-CM

## 2024-03-28 ENCOUNTER — Ambulatory Visit

## 2024-04-03 ENCOUNTER — Ambulatory Visit
Admission: RE | Admit: 2024-04-03 | Discharge: 2024-04-03 | Disposition: A | Discharge status: Home / Self Care | Source: Ambulatory Visit | Attending: Obstetrics and Gynecology | Admitting: Obstetrics and Gynecology

## 2024-04-03 DIAGNOSIS — Z1231 Encounter for screening mammogram for malignant neoplasm of breast: Secondary | ICD-10-CM

## 2024-05-24 ENCOUNTER — Other Ambulatory Visit: Payer: Self-pay | Admitting: Internal Medicine

## 2024-05-24 DIAGNOSIS — E890 Postprocedural hypothyroidism: Secondary | ICD-10-CM

## 2024-05-31 ENCOUNTER — Other Ambulatory Visit: Payer: Self-pay

## 2024-05-31 DIAGNOSIS — E890 Postprocedural hypothyroidism: Secondary | ICD-10-CM

## 2024-05-31 MED ORDER — LEVOTHYROXINE SODIUM 100 MCG PO TABS
100.0000 ug | ORAL_TABLET | Freq: Every day | ORAL | 0 refills | Status: DC
Start: 2024-05-31 — End: 2024-07-01

## 2024-06-28 ENCOUNTER — Ambulatory Visit: Payer: 59 | Admitting: Internal Medicine

## 2024-06-28 ENCOUNTER — Other Ambulatory Visit

## 2024-06-28 ENCOUNTER — Encounter: Payer: Self-pay | Admitting: Internal Medicine

## 2024-06-28 VITALS — BP 116/80 | Ht 63.0 in | Wt 138.0 lb

## 2024-06-28 DIAGNOSIS — Z8639 Personal history of other endocrine, nutritional and metabolic disease: Secondary | ICD-10-CM | POA: Diagnosis not present

## 2024-06-28 DIAGNOSIS — E890 Postprocedural hypothyroidism: Secondary | ICD-10-CM

## 2024-06-28 NOTE — Patient Instructions (Signed)
  Please continue Levothyroxine 100 mcg daily.  Take the thyroid hormone every day, with water, at least 30 minutes before breakfast, separated by at least 4 hours from: - acid reflux medications - calcium - iron - multivitamins  Please stop at the lab.  Please come back for a follow-up appointment in 1 year. 

## 2024-06-28 NOTE — Progress Notes (Addendum)
 Patient ID: Lisa Day, female   DOB: 06/26/1976, 48 y.o.   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  Last visit 1 year ago.  Interim hx: She has no complaints at today's visit other than  fatigue. She previously had blurry and double vision and started to see Dr. Zaldivar.  She was on Tepezza (had 8 sessions) >> eye pain, swelling, and dryness resolved. Before last visit she lost 25 pounds by doing intermittent fasting 11 am-7 pm, no starches, no concentrated sweets, no fried foods, drinks water.  She mostly maintained her weight since last visit on the same diet.  Reviewed history: Pt. has been dx with Yadao' disease in 2006 and developed post-ablative hypothyroidism after RAI treatment >> on Levothyroxine  175 mcg >> now on Tirosint  150 mcg + added 25 mcg daily in 08/2016.   She had more nausea and migraines since starting Tirosint .   We changed to Synthroid  d.a.w in an effort to minimize adverse effects from the thyroid  hormone tablet.  Her nausea resolved after switching to Synthroid  50 mcg tablets.  Previously, she was taking 200 mcg of Synthroid  daily, but in 08/2019 she could not refill the medication due to an absence of 2 years from the office, so she was off the medication for 2 weeks.  The TSH returned very high, in the 80s, and she was feeling very poorly: Fatigue, hair loss, weight gain, dry skin.  These have all improved afterwards.    We started LT4 150 mcg daily (changed to generic 10/2019 due to insurance coverage) her TFTs improved to normal.  However, at last visit, in 05/2020, TSH was again very high...  In 04-12/2020: TSH reportedly suppressed >> dose of LT4 decreased to 100 mcg daily by OBGYN dr. (I was not aware...)    06-02/2021: TSH reportedly normal -however, per check of records from [facility] after the visit: TSH was 38.6, very high  In  06/2021, TSH was still elevated, at 30.1, so increased her LT4 dose to 125 mcg daily.  In 12/2022, we decreased LT4 dose to 112 mcg daily.  In 06/2023, we decreased LT4 dose to 100 mcg daily.  Pt is currently on levothyroxine  100 mcg daily, taken: - in am - fasting - at least 30 min from b'fast - no calcium  - no iron - no multivitamins - no PPIs - not on Biotin; prev. on B12, but stopped  Reviewed her TFTs: 03/18/2024: TSH 0.46 Lab Results  Component Value Date   TSH 0.03 (L) 06/29/2023   TSH 0.20 (L) 12/27/2022   TSH 1.01 06/28/2022   TSH 2.45 12/24/2021   TSH 1.99 09/16/2021   TSH 30.100 (H) 06/25/2021   TSH 36.800 (H) 06/19/2020   TSH 0.605 12/13/2019   TSH 80.34 (H) 09/17/2019   TSH 0.03 (L) 09/07/2018   FREET4 1.96 (H) 06/29/2023   FREET4 1.39 12/27/2022   FREET4 1.35 06/28/2022   FREET4 0.76 12/24/2021   FREET4 1.07 09/16/2021   FREET4 0.83 06/25/2021   FREET4 0.91 06/19/2020   FREET4 1.70 12/13/2019   FREET4 0.00 Repeated and verified X2. (L) 09/17/2019   FREET4 1.58 09/14/2018  02/12/2021: TSH 38.6 08/03/2016: TSH 22.47 06/20/2016: TSH 8.52  05/03/2016: TSH 47.3, free T4 0.28, free T3 2.36 05/25/2008: TSH 0.01, free T4 4.58   Lab Results  Component Value Date   TSI 97 06/28/2022   Pt denies: -  feeling nodules in neck - hoarseness - dysphagia - choking  No FH of thyroid  cancer. No h/o radiation tx to head or neck. No herbal supplements. No Biotin use. No recent steroids use.  She also has a history of asthma, headaches, fibromyalgia-previously on Cymbalta, amitriptyline. Prev. On B12 injections, then on p.o. B12, now off.  ROS: + see HPI   I reviewed pt's medications, allergies, PMH, social hx, family hx, and changes were documented in the history of present illness. Otherwise, unchanged from my initial visit note.  Past Medical History:  Diagnosis Date   Allergic rhinitis    Allergy    Anxiety and depression    Asthma    Fibromyalgia     Grave's disease    Headache 07/02/2014   Past Surgical History:  Procedure Laterality Date   APPENDECTOMY  08/23/1999   BREAST BIOPSY Right 11/17/2022   US  RT BREAST BX W LOC DEV 1ST LESION IMG BX SPEC US  GUIDE 11/17/2022 [facility]   BREAST BIOPSY Left 2020   CESAREAN SECTION  04/20/2011   Procedure: CESAREAN SECTION;  Surgeon: Rosaline LITTIE Cobble, MD;  Location: [facility];  Service: Gynecology;  Laterality: N/A;  Primary Cesarean Section with Delivery Baby Boy @ 0421   CHOLECYSTECTOMY  08/22/2004   EVACUATION BREAST HEMATOMA Right 03/13/2023   Procedure: EXCISION CHRONIC RIGHT BREAST ABSCESS;  Surgeon: Vernetta Berg, MD;  Location: [facility];  Service: General;  Laterality: Right;  LMA   HIP ARTHROSCOPY     Social History   Social History   Marital status: Single    Spouse name: N/A   Number of children: 2   Occupational History   research    Social History Main Topics   Smoking status: Never Smoker   Smokeless tobacco: Never Used   Alcohol use No   Drug use: No   Current Outpatient Medications on File Prior to Visit  Medication Sig Dispense Refill   albuterol  (PROVENTIL  HFA;VENTOLIN  HFA) 108 (90 Base) MCG/ACT inhaler Inhale 2 puffs into the lungs every 4 (four) hours as needed for wheezing or shortness of breath. 1 Inhaler 2   amphetamine-dextroamphetamine (ADDERALL) 15 MG tablet      beclomethasone (QVAR ) 80 MCG/ACT inhaler Inhale 2 puffs into the lungs 2 (two) times daily. (Patient taking differently: Inhale 1 puff into the lungs 2 (two) times daily.) 1 Inhaler 5   cyclobenzaprine (FLEXERIL) 10 MG tablet 1 TABLET ORALLY ONCE DAILY AS NEEDED MUSCLE SPASMS 30 DAYS     dicyclomine  (BENTYL ) 20 MG tablet Take 1 tablet (20 mg total) by mouth 2 (two) times daily. 20 tablet 0   EPINEPHrine  0.3 mg/0.3 mL IJ SOAJ injection USE AS DIRECTED FOR SEVERE ALLERGIC REACTION. 2 Device 2   Ergocalciferol (VITAMIN D2 PO) 1 CAPSULE ORALLY ONCE A WEEK FOR 60 DAYS (Patient  not taking: Reported on 06/29/2023)     fluticasone  (FLONASE ) 50 MCG/ACT nasal spray Place 2 sprays into both nostrils at bedtime. (Patient not taking: Reported on 06/29/2023) 16 g 5   levocetirizine (XYZAL ) 5 MG tablet Take 1 tablet (5 mg total) by mouth every evening. 30 tablet 5   levonorgestrel (MIRENA) 20 MCG/24HR IUD 1 each by Intrauterine route once.     levothyroxine  (SYNTHROID ) 100 MCG tablet Take 1 tablet (100 mcg total) by mouth daily before breakfast. 90 tablet 0   LORazepam (ATIVAN PO) Take by mouth.     Lumateperone Tosylate (CAPLYTA PO) Take by mouth. (Patient not taking: Reported on 06/29/2023)  meloxicam (MOBIC) 15 MG tablet TAKE 1 TABLET BY MOUTH EVERY DAY FOR 30 DAYS     montelukast  (SINGULAIR ) 10 MG tablet Take 1 tablet (10 mg total) by mouth at bedtime. 30 tablet 0   naproxen  (NAPROSYN ) 375 MG tablet Take 1 tablet (375 mg total) by mouth 2 (two) times daily. 20 tablet 0   zolpidem  (AMBIEN ) 5 MG tablet Take by mouth.     No current facility-administered medications on file prior to visit.   Allergies  Allergen Reactions   Shrimp [Shellfish Allergy] Hives, Shortness Of Breath and Swelling   Watermelon Concentrate Hives, Shortness Of Breath and Swelling   Gadolinium Derivatives Nausea And Vomiting   Latex Rash   Family History  Problem Relation Age of Onset   Hypertension Mother    Allergic rhinitis Father    Asthma Father    Migraines Sister    Esophageal cancer Maternal Grandmother    Breast cancer Maternal Grandmother 55   Migraines Brother    Allergic rhinitis Brother    Angioedema Neg Hx    Eczema Neg Hx    Immunodeficiency Neg Hx    Urticaria Neg Hx    Colon polyps Neg Hx    Colon cancer Neg Hx    Rectal cancer Neg Hx    Stomach cancer Neg Hx    PE: BP 116/80   Ht 5' 3 (1.6 m)   Wt 138 lb (62.6 kg)   BMI 24.45 kg/m  Wt Readings from Last 10 Encounters:  06/28/24 138 lb (62.6 kg)  06/29/23 135 lb 12.8 oz (61.6 kg)  04/07/23 145 lb (65.8 kg)   03/17/23 145 lb (65.8 kg)  03/13/23 147 lb 14.9 oz (67.1 kg)  03/08/23 140 lb (63.5 kg)  02/01/23 140 lb (63.5 kg)  06/28/22 168 lb 9.6 oz (76.5 kg)  12/24/21 177 lb 12.8 oz (80.6 kg)  06/25/21 165 lb 3.2 oz (74.9 kg)   Constitutional: Normal weight, in NAD Eyes:  EOMI, + very mild exophthalmos; no palpebral edema. ENT: no neck masses, no cervical lymphadenopathy Cardiovascular: RRR, No MRG Respiratory: CTA B Musculoskeletal: no deformities Skin:no rashes Neurological: no tremor with outstretched hands  ASSESSMENT: 1. Postablative Hypothyroidism  2.  H/o [place] ds.  PLAN:  1. Patient with longstanding, uncontrolled hypothyroidism, with h/o being lost for follow-up for 2 years before her visit in 08/2019, during which she ran out of levothyroxine .  At that time she was on 200 mcg a day.  A TSH obtained at that time was very high, at 80.  We restarted levothyroxine  at a lower dose and her TFTs normalized.  Afterwards, we were able to decrease the dose further. - latest thyroid  labs reviewed with pt. >> TSH was suppressed at last visit, most likely due to the significant weight loss, so I advised her to reduce the dose of levothyroxine  and she had another TSH obtained in 02/2024 which returned within normal range. - she continues on LT4 100 mcg daily - pt feels good on this dose.  Before last visit she had significant weight loss, of more than 30 pounds, after she made salutary changes in her diet.  Since last visit, she only gained 10 pounds.  She continues on the above diet. - we discussed about taking the thyroid  hormone every day, with water, >30 minutes before breakfast, separated by >4 hours from acid reflux medications, calcium , iron, multivitamins. Pt. is taking it correctly. - will check thyroid  tests today: TSH and fT4 - If labs  are abnormal, she will need to return for repeat TFTs in 1.5 months - RTC in 1 year and possibly sooner for labs.  2.  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 - She sees Dr. Zaldivar with ophthalmology.  She had the bedside and feels that eye pain, swelling, and dryness resolved  Needs refills - [facility].  Component     Latest Ref Rng 06/28/2024  Triiodothyronine,Free,Serum     2.3 - 4.2 pg/mL 2.3   TSH     mIU/L 0.93   T4,Free(Direct)     0.8 - 1.8 ng/dL 1.4   Normal.  Requested Prescriptions   Signed Prescriptions Disp Refills   levothyroxine  (SYNTHROID ) 100 MCG tablet 90 tablet 3    Sig: Take 1 tablet (100 mcg total) by mouth daily before breakfast.   Lela Fendt, MD PhD [facility]ocr[facility]

## 2024-06-29 LAB — TSH: TSH: 0.93 m[IU]/L

## 2024-06-29 LAB — T4, FREE: Free T4: 1.4 ng/dL (ref 0.8–1.8)

## 2024-06-29 LAB — T3, FREE: T3, Free: 2.3 pg/mL (ref 2.3–4.2)

## 2024-07-01 ENCOUNTER — Ambulatory Visit: Payer: Self-pay | Admitting: Internal Medicine

## 2024-07-01 MED ORDER — LEVOTHYROXINE SODIUM 100 MCG PO TABS: 100.0000 ug | ORAL_TABLET | Freq: Every day | ORAL | 3 refills | Status: DC

## 2024-07-01 NOTE — Addendum Note (Signed)
 Addended by: TRIXIE FILE on: 07/01/2024 11:36 AM   Modules accepted: Orders

## 2024-08-22 ENCOUNTER — Other Ambulatory Visit: Payer: Self-pay | Admitting: Internal Medicine

## 2024-08-22 DIAGNOSIS — E890 Postprocedural hypothyroidism: Secondary | ICD-10-CM

## 2024-08-31 ENCOUNTER — Emergency Department (HOSPITAL_BASED_OUTPATIENT_CLINIC_OR_DEPARTMENT_OTHER)

## 2024-08-31 ENCOUNTER — Emergency Department (HOSPITAL_BASED_OUTPATIENT_CLINIC_OR_DEPARTMENT_OTHER)
Admission: EM | Admit: 2024-08-31 | Discharge: 2024-08-31 | Disposition: A | Discharge status: Home / Self Care | Attending: Emergency Medicine | Admitting: Emergency Medicine

## 2024-08-31 ENCOUNTER — Emergency Department (HOSPITAL_BASED_OUTPATIENT_CLINIC_OR_DEPARTMENT_OTHER): Admitting: Radiology

## 2024-08-31 DIAGNOSIS — Z91040 Latex allergy status: Secondary | ICD-10-CM | POA: Insufficient documentation

## 2024-08-31 DIAGNOSIS — M79642 Pain in left hand: Secondary | ICD-10-CM | POA: Diagnosis not present

## 2024-08-31 DIAGNOSIS — W19XXXA Unspecified fall, initial encounter: Secondary | ICD-10-CM

## 2024-08-31 DIAGNOSIS — W010XXA Fall on same level from slipping, tripping and stumbling without subsequent striking against object, initial encounter: Secondary | ICD-10-CM | POA: Diagnosis not present

## 2024-08-31 DIAGNOSIS — Y9301 Activity, walking, marching and hiking: Secondary | ICD-10-CM | POA: Diagnosis not present

## 2024-08-31 DIAGNOSIS — S3992XA Unspecified injury of lower back, initial encounter: Secondary | ICD-10-CM | POA: Diagnosis present

## 2024-08-31 DIAGNOSIS — S300XXA Contusion of lower back and pelvis, initial encounter: Secondary | ICD-10-CM | POA: Diagnosis not present

## 2024-08-31 MED ORDER — IBUPROFEN 400 MG PO TABS
600.0000 mg | ORAL_TABLET | Freq: Once | ORAL | Status: AC
  Administered 2024-08-31: 600 mg via ORAL
  Filled 2024-08-31: qty 1

## 2024-08-31 NOTE — ED Provider Notes (Signed)
 " [place] EMERGENCY DEPARTMENT AT [facility] Provider Note   CSN: [id number] Arrival date & time: 08/31/24  1149     Patient presents with: Lisa Day is a 49 y.o. female.   Patient presents to the emergency department today for evaluation of injury after slip and fall occurring just prior to arrival.  She states that she was walking in to the building and slipped on a wet floor.  She fell backwards.  She caught herself with her left hand and landed on her tailbone.  She reports history of hip arthroscopy.  No treatments prior to arrival.  She is ambulatory.       Prior to Admission medications  Medication Sig Start Date End Date Taking? Authorizing Provider  albuterol  (PROVENTIL  HFA;VENTOLIN  HFA) 108 (90 Base) MCG/ACT inhaler Inhale 2 puffs into the lungs every 4 (four) hours as needed for wheezing or shortness of breath. 05/16/16   Asa Aloysius LABOR, MD  amphetamine-dextroamphetamine (ADDERALL) 15 MG tablet     [provider]  beclomethasone (QVAR ) 80 MCG/ACT inhaler Inhale 2 puffs into the lungs 2 (two) times daily. Patient taking differently: Inhale 1 puff into the lungs 2 (two) times daily. 05/16/16   Asa Aloysius LABOR, MD  cyclobenzaprine (FLEXERIL) 10 MG tablet 1 TABLET ORALLY ONCE DAILY AS NEEDED MUSCLE SPASMS 30 DAYS    [provider]  dicyclomine  (BENTYL ) 20 MG tablet Take 1 tablet (20 mg total) by mouth 2 (two) times daily. 09/15/23   Kommor, Madison, MD  EPINEPHrine  0.3 mg/0.3 mL IJ SOAJ injection USE AS DIRECTED FOR SEVERE ALLERGIC REACTION. 05/16/16   Bardelas, Jose A, MD  Ergocalciferol (VITAMIN D2 PO) 1 CAPSULE ORALLY ONCE A WEEK FOR 60 DAYS Patient not taking: Reported on 06/29/2023    [provider]  fluticasone  (FLONASE ) 50 MCG/ACT nasal spray Place 2 sprays into both nostrils at bedtime. Patient not taking: Reported on 06/29/2023 05/16/16   Asa Aloysius LABOR, MD  levocetirizine (XYZAL ) 5 MG tablet Take 1 tablet (5 mg  total) by mouth every evening. 05/16/16   Asa Aloysius LABOR, MD  levonorgestrel (MIRENA) 20 MCG/24HR IUD 1 each by Intrauterine route once.    [provider]  levothyroxine  (SYNTHROID ) 100 MCG tablet TAKE 1 TABLET BY MOUTH DAILY BEFORE BREAKFAST. 08/23/24   Trixie File, MD  LORazepam (ATIVAN PO) Take by mouth.    [provider]  Lumateperone Tosylate (CAPLYTA PO) Take by mouth. Patient not taking: Reported on 06/29/2023    [provider]  meloxicam (MOBIC) 15 MG tablet TAKE 1 TABLET BY MOUTH EVERY DAY FOR 30 DAYS 01/24/23   [provider]  montelukast  (SINGULAIR ) 10 MG tablet Take 1 tablet (10 mg total) by mouth at bedtime. 02/15/17   Asa Aloysius LABOR, MD  naproxen  (NAPROSYN ) 375 MG tablet Take 1 tablet (375 mg total) by mouth 2 (two) times daily. 09/15/23   Kommor, Madison, MD  zolpidem  (AMBIEN ) 5 MG tablet Take by mouth.    [provider]    Allergies: Shrimp [shellfish allergy], Watermelon concentrate, Gadolinium derivatives, and Latex    Review of Systems  Updated Vital Signs BP (!) 131/98 (BP Location: Left Arm)   Pulse 77   Temp 98.1 F (36.7 C) (Oral)   Resp 15   SpO2 100%   Physical Exam Vitals and nursing note reviewed.  Constitutional:      General: She is not in acute distress.    Appearance: She is well-developed.  HENT:  Head: Normocephalic and atraumatic.     Right Ear: External ear normal.     Left Ear: External ear normal.     Nose: Nose normal.  Eyes:     Conjunctiva/sclera: Conjunctivae normal.  Cardiovascular:     Rate and Rhythm: Normal rate and regular rhythm.     Heart sounds: No murmur heard. Pulmonary:     Effort: No respiratory distress.     Breath sounds: No wheezing, rhonchi or rales.  Abdominal:     Palpations: Abdomen is soft.     Tenderness: There is no abdominal tenderness. There is no guarding or rebound.  Musculoskeletal:     Left elbow: Normal range of motion. No tenderness.     Left  wrist: No tenderness or bony tenderness. Normal range of motion.     Left hand: Tenderness present. No bony tenderness. Normal range of motion.     Cervical back: Normal range of motion and neck supple.     Right lower leg: No edema.     Left lower leg: No edema.  Skin:    General: Skin is warm and dry.     Findings: No rash.  Neurological:     General: No focal deficit present.     Mental Status: She is alert. Mental status is at baseline.     Motor: No weakness.  Psychiatric:        Mood and Affect: Mood normal.     (all labs ordered are listed, but only abnormal results are displayed) Labs Reviewed - No data to display   EKG: None  Radiology: DG Hand Complete Left Result Date: 08/31/2024 CLINICAL DATA:  Status post fall. EXAM: LEFT HAND - COMPLETE 3+ VIEW COMPARISON:  None Available. FINDINGS: There is no evidence of fracture or dislocation. There is no evidence of arthropathy or other focal bone abnormality. Soft tissues are unremarkable. IMPRESSION: Negative. Electronically Signed   By: Suzen Dials M.D.   On: 08/31/2024 12:44     Procedures   Medications Ordered in the ED  ibuprofen  (ADVIL ) tablet 600 mg (600 mg Oral Given 08/31/24 1435)   ED Course  Patient seen and examined. History obtained directly from patient.   Labs/EKG: Urine pregnancy test  Imaging: X-ray of the left hand ordered in triage, personally reviewed and interpreted, agree negative.  I discussed potential imaging of the pelvis and sacral area with the patient.  Discussed that coccygeal injury is treated with cushioning and pain medication and imaging would not likely change to treatment.  After discussion, she would like to proceed with imaging.  Medications/Fluids: Ordered: P.o. ibuprofen   Most recent vital signs reviewed and are as follows: BP (!) 131/98 (BP Location: Left Arm)   Pulse 77   Temp 98.1 F (36.7 C) (Oral)   Resp 15   SpO2 100%   Initial impression: Mechanical fall,  likely tailbone injury, hand contusion.  3:28 PM Reassessment performed. Patient appears stable.  Imaging personally visualized and interpreted including: X-ray of the coccyx and pelvis, agree negative.  Reviewed pertinent lab work and imaging with patient at bedside. Questions answered.   Most current vital signs reviewed and are as follows: BP (!) 131/98 (BP Location: Left Arm)   Pulse 77   Temp 98.1 F (36.7 C) (Oral)   Resp 15   SpO2 100%   Plan: Discharge to home.   Prescriptions written for: None  Other home care instructions discussed: Patient has meloxicam that she uses as needed for home.  Encouraged her to use this medication for her current injuries.  Also discussed RICE protocol, donut pillow/unloading of the coccygeal area with sitting.  ED return instructions discussed: New or worsening symptoms  Follow-up instructions discussed: Patient encouraged to follow-up with their PCP in 7 days if not improving.                                   Medical Decision Making Amount and/or Complexity of Data Reviewed Labs: ordered. Radiology: ordered.   Patient with mechanical slip and fall.  She has injury to her left hand, and coccygeal area.  Imaging is negative.  Patient ambulatory.  Will treat this symptomatically with NSAIDs, pressure offloading.  Patient encouraged to follow-up as needed.     Final diagnoses:  Contusion of coccyx, initial encounter  Fall, initial encounter  Hand pain, left    ED Discharge Orders     None          Desiderio Chew, [place] 08/31/24 1529  "

## 2024-08-31 NOTE — ED Triage Notes (Signed)
 Patient states mechanical fall on wet floor. Fell onto tailbone and left hand. Pain to both.

## 2024-08-31 NOTE — Discharge Instructions (Addendum)
 Please read and follow all provided instructions.  Your diagnoses today include:  1. Contusion of coccyx, initial encounter   2. Fall, initial encounter     Tests performed today include: X-ray of your hand, tailbone and pelvis were negative for fracture Vital signs. See below for your results today.   Home care instructions:  Take home meloxicam daily for pain.  Follow any educational materials contained in this packet.  Follow-up instructions: Please follow-up with your primary care provider in the next 7 days for further evaluation of your symptoms if not improving or still bothersome.  Return instructions:  Please return to the Emergency Department if you experience worsening symptoms.  Please return if you have any other emergent concerns.  Additional Information:  Your vital signs today were: BP (!) 131/98 (BP Location: Left Arm)   Pulse 77   Temp 98.1 F (36.7 C) (Oral)   Resp 15   SpO2 100%  If your blood pressure (BP) was elevated above 135/85 this visit, please have this repeated by your doctor within one month. --------------

## 2025-06-30 ENCOUNTER — Ambulatory Visit: Admitting: Internal Medicine
